# Patient Record
Sex: Male | Born: 1980 | Race: Black or African American | Hispanic: No | Marital: Single | Smoking: Current every day smoker
Health system: Southern US, Community
[De-identification: ages and names within clinical notes are randomized; demographics above are authoritative.]

## PROBLEM LIST (undated history)

## (undated) DIAGNOSIS — I10 Essential (primary) hypertension: Secondary | ICD-10-CM

## (undated) DIAGNOSIS — E785 Hyperlipidemia, unspecified: Secondary | ICD-10-CM

## (undated) DIAGNOSIS — I5020 Unspecified systolic (congestive) heart failure: Secondary | ICD-10-CM

## (undated) DIAGNOSIS — T50901A Poisoning by unspecified drugs, medicaments and biological substances, accidental (unintentional), initial encounter: Secondary | ICD-10-CM

## (undated) DIAGNOSIS — Z91148 Patient's other noncompliance with medication regimen for other reason: Secondary | ICD-10-CM

## (undated) DIAGNOSIS — F1210 Cannabis abuse, uncomplicated: Secondary | ICD-10-CM

## (undated) DIAGNOSIS — Z720 Tobacco use: Secondary | ICD-10-CM

## (undated) DIAGNOSIS — F1410 Cocaine abuse, uncomplicated: Secondary | ICD-10-CM

## (undated) DIAGNOSIS — F1111 Opioid abuse, in remission: Secondary | ICD-10-CM

## (undated) DIAGNOSIS — R45851 Suicidal ideations: Secondary | ICD-10-CM

## (undated) DIAGNOSIS — Z5900 Homelessness unspecified: Secondary | ICD-10-CM

## (undated) DIAGNOSIS — F1910 Other psychoactive substance abuse, uncomplicated: Secondary | ICD-10-CM

## (undated) HISTORY — PX: OTHER SURGICAL HISTORY: SHX169

---

## 2000-05-15 ENCOUNTER — Emergency Department (HOSPITAL_COMMUNITY): Admission: EM | Admit: 2000-05-15 | Discharge: 2000-05-15 | Payer: Self-pay | Admitting: Emergency Medicine

## 2000-05-17 ENCOUNTER — Emergency Department (HOSPITAL_COMMUNITY): Admission: EM | Admit: 2000-05-17 | Discharge: 2000-05-17 | Payer: Self-pay | Admitting: Emergency Medicine

## 2004-06-18 ENCOUNTER — Emergency Department (HOSPITAL_COMMUNITY): Admission: EM | Admit: 2004-06-18 | Discharge: 2004-06-18 | Payer: Self-pay | Admitting: Emergency Medicine

## 2004-10-18 ENCOUNTER — Emergency Department (HOSPITAL_COMMUNITY): Admission: EM | Admit: 2004-10-18 | Discharge: 2004-10-18 | Payer: Self-pay

## 2004-10-22 ENCOUNTER — Emergency Department (HOSPITAL_COMMUNITY): Admission: EM | Admit: 2004-10-22 | Discharge: 2004-10-22 | Payer: Self-pay | Admitting: Emergency Medicine

## 2005-05-08 ENCOUNTER — Emergency Department (HOSPITAL_COMMUNITY): Admission: EM | Admit: 2005-05-08 | Discharge: 2005-05-08 | Payer: Self-pay | Admitting: Emergency Medicine

## 2006-03-07 ENCOUNTER — Emergency Department (HOSPITAL_COMMUNITY): Admission: EM | Admit: 2006-03-07 | Discharge: 2006-03-07 | Payer: Self-pay | Admitting: Emergency Medicine

## 2007-02-25 ENCOUNTER — Emergency Department (HOSPITAL_COMMUNITY): Admission: EM | Admit: 2007-02-25 | Discharge: 2007-02-25 | Payer: Self-pay | Admitting: Emergency Medicine

## 2008-08-23 ENCOUNTER — Emergency Department (HOSPITAL_COMMUNITY): Admission: EM | Admit: 2008-08-23 | Discharge: 2008-08-23 | Payer: Self-pay | Admitting: Emergency Medicine

## 2008-12-23 ENCOUNTER — Emergency Department (HOSPITAL_COMMUNITY): Admission: EM | Admit: 2008-12-23 | Discharge: 2008-12-24 | Payer: Self-pay | Admitting: Emergency Medicine

## 2009-09-24 ENCOUNTER — Emergency Department (HOSPITAL_COMMUNITY): Admission: EM | Admit: 2009-09-24 | Discharge: 2009-09-24 | Payer: Self-pay | Admitting: Emergency Medicine

## 2011-07-17 IMAGING — CR DG ABDOMEN 1V
1 series · 1 of 1 positions shown · non-contrast
Comparison: None.

CLINICAL DATA: Status post ingestion of bag of marijuana.

ABDOMEN - 1 VIEW

[view not recorded]
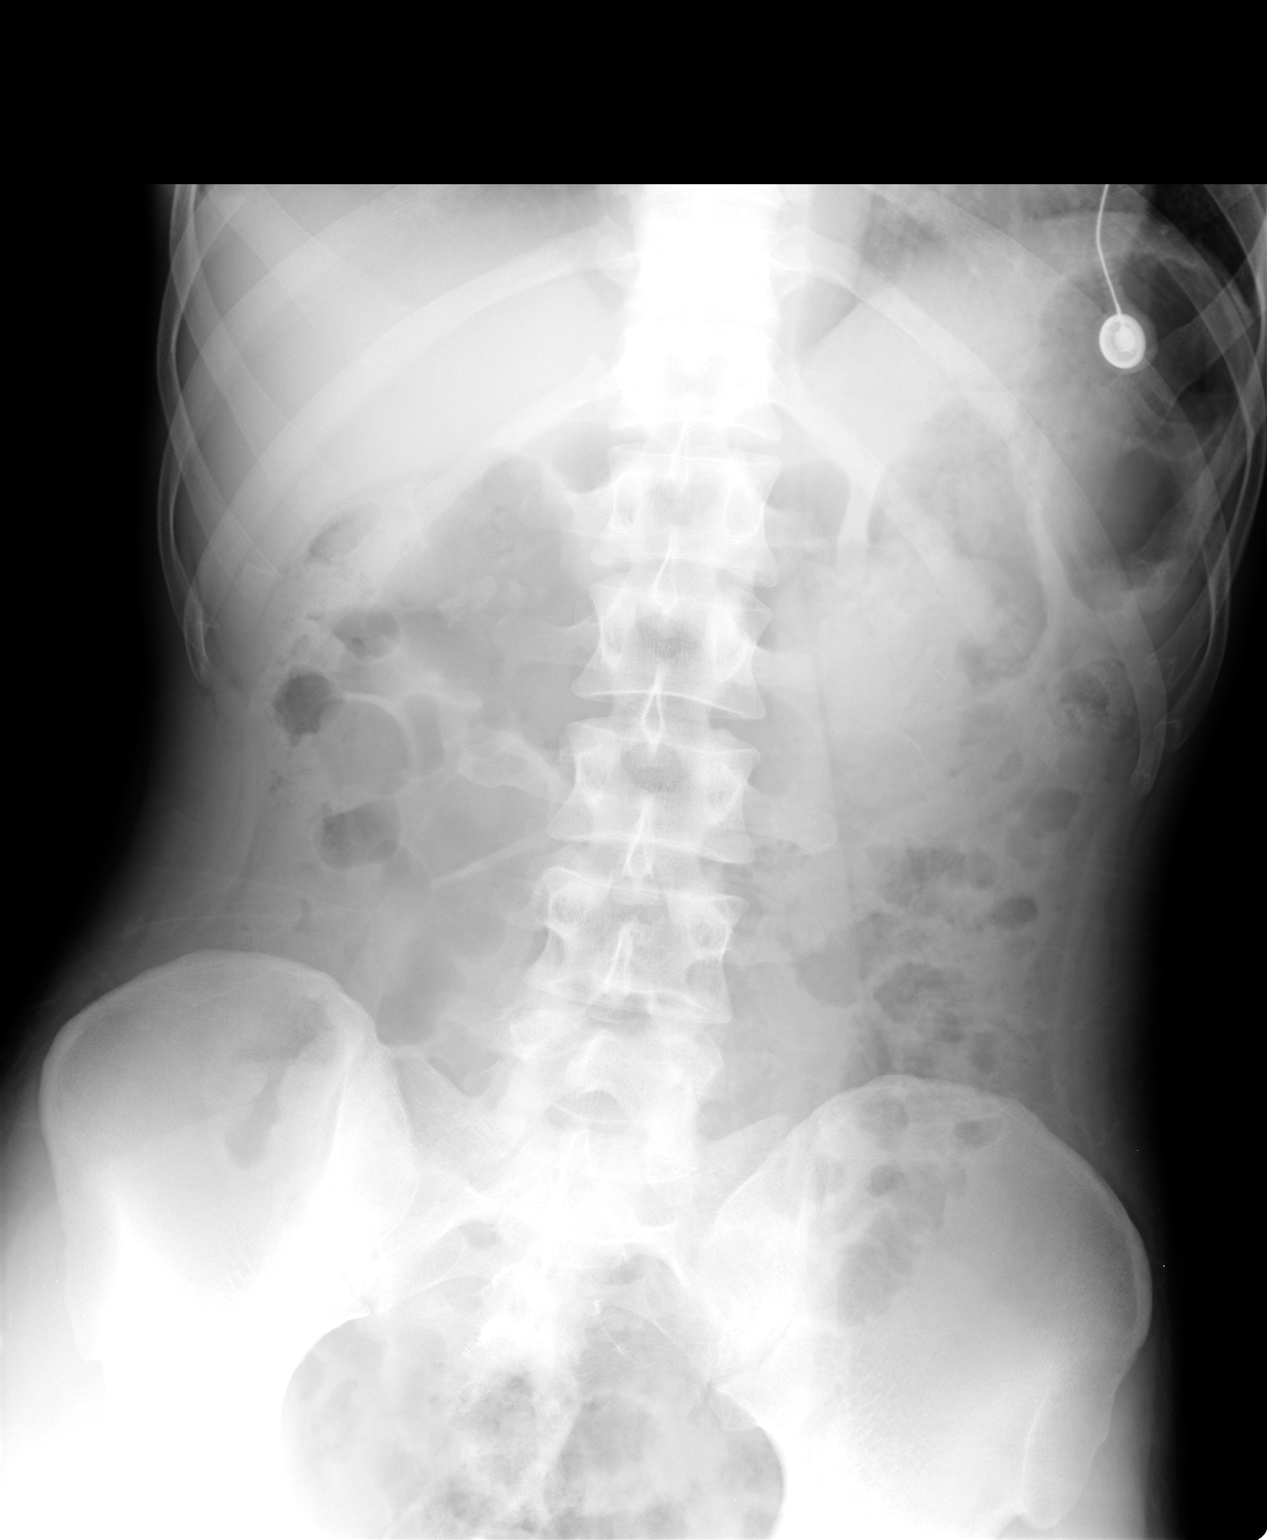

[1 of 1 positions shown; findings below may reference images not displayed]

FINDINGS: The visualized bowel gas pattern is unremarkable.
Scattered air and stool filled loops of colon are seen; no abnormal
dilatation of small bowel loops is seen to suggest small bowel
obstruction.  No free intra-abdominal air is identified.  Soft
tissue density overlying the left upper quadrant likely reflects
material within the stomach.

The visualized osseous structures are within normal limits; the
sacroiliac joints are unremarkable in appearance.  The visualized
lung bases are essentially clear.
IMPRESSION: 1.  Unremarkable bowel gas pattern; no free intra-abdominal air
seen.
2.  Soft tissue density material noted within the stomach.

## 2011-07-28 LAB — CBC
HCT: 44.3
Hemoglobin: 15
MCHC: 33.8
MCV: 88.4
Platelets: 203
RDW: 13.5
WBC: 5

## 2011-07-28 LAB — DIFFERENTIAL
Eosinophils Absolute: 0.1
Eosinophils Relative: 3
Lymphocytes Relative: 28
Lymphs Abs: 1.4

## 2011-07-28 LAB — COMPREHENSIVE METABOLIC PANEL
BUN: 16
CO2: 27
Calcium: 9.6
GFR calc Af Amer: >60
GFR calc non Af Amer: >60
Glucose, Bld: 96
Total Bilirubin: 0.7

## 2011-07-28 LAB — URINALYSIS, ROUTINE W REFLEX MICROSCOPIC
Hgb urine dipstick: NEGATIVE
Protein, ur: NEGATIVE
Specific Gravity, Urine: 1.017
Urobilinogen, UA: 1
pH: 6

## 2011-07-28 LAB — RAPID URINE DRUG SCREEN, HOSP PERFORMED
Benzodiazepines: NOT DETECTED
Cocaine: POSITIVE — AB
Opiates: NOT DETECTED

## 2011-07-28 LAB — ETHANOL: Alcohol, Ethyl (B): <5

## 2011-12-05 ENCOUNTER — Other Ambulatory Visit: Payer: Self-pay

## 2011-12-05 ENCOUNTER — Encounter (HOSPITAL_COMMUNITY): Payer: Self-pay | Admitting: Emergency Medicine

## 2011-12-05 ENCOUNTER — Emergency Department (HOSPITAL_COMMUNITY)
Admission: EM | Admit: 2011-12-05 | Discharge: 2011-12-08 | Disposition: A | Discharge status: Other Acute Inpatient Hospital | Payer: Self-pay | Attending: Emergency Medicine | Admitting: Emergency Medicine

## 2011-12-05 DIAGNOSIS — R509 Fever, unspecified: Secondary | ICD-10-CM | POA: Insufficient documentation

## 2011-12-05 DIAGNOSIS — F17200 Nicotine dependence, unspecified, uncomplicated: Secondary | ICD-10-CM | POA: Insufficient documentation

## 2011-12-05 DIAGNOSIS — R0989 Other specified symptoms and signs involving the circulatory and respiratory systems: Secondary | ICD-10-CM | POA: Insufficient documentation

## 2011-12-05 DIAGNOSIS — R110 Nausea: Secondary | ICD-10-CM | POA: Insufficient documentation

## 2011-12-05 DIAGNOSIS — R0609 Other forms of dyspnea: Secondary | ICD-10-CM | POA: Insufficient documentation

## 2011-12-05 DIAGNOSIS — F29 Unspecified psychosis not due to a substance or known physiological condition: Secondary | ICD-10-CM | POA: Insufficient documentation

## 2011-12-05 LAB — RAPID URINE DRUG SCREEN, HOSP PERFORMED
Amphetamines: NOT DETECTED
Barbiturates: NOT DETECTED
Cocaine: POSITIVE — AB
Opiates: NOT DETECTED
Tetrahydrocannabinol: POSITIVE — AB

## 2011-12-05 LAB — POCT I-STAT, CHEM 8
BUN: 16 mg/dL (ref 6–23)
Chloride: 103 mEq/L (ref 96–112)
Glucose, Bld: 102 mg/dL — ABNORMAL HIGH (ref 70–99)
Hemoglobin: 14.3 g/dL (ref 13.0–17.0)
Potassium: 3.5 mEq/L (ref 3.5–5.1)
Sodium: 142 mEq/L (ref 135–145)
TCO2: 28 mmol/L (ref 0–100)

## 2011-12-05 LAB — CBC
HCT: 39.1 % (ref 39.0–52.0)
RDW: 14 % (ref 11.5–15.5)

## 2011-12-05 LAB — DIFFERENTIAL
Basophils Absolute: 0 10*3/uL (ref 0.0–0.1)
Basophils Relative: 0 % (ref 0–1)
Monocytes Absolute: 0.7 10*3/uL (ref 0.1–1.0)
Neutro Abs: 6.7 10*3/uL (ref 1.7–7.7)
Neutrophils Relative %: 72 % (ref 43–77)

## 2011-12-05 MED ORDER — ONDANSETRON 4 MG PO TBDP
4.0000 mg | ORAL_TABLET | Freq: Once | ORAL | Status: AC
  Administered 2011-12-05: 4 mg via ORAL
  Filled 2011-12-05: qty 1

## 2011-12-05 MED ORDER — NICOTINE 21 MG/24HR TD PT24
21.0000 mg | MEDICATED_PATCH | Freq: Every day | TRANSDERMAL | Status: DC
  Administered 2011-12-06: 21 mg via TRANSDERMAL
  Filled 2011-12-05: qty 1

## 2011-12-05 MED ORDER — IBUPROFEN 600 MG PO TABS
600.0000 mg | ORAL_TABLET | Freq: Three times a day (TID) | ORAL | Status: DC | PRN
  Administered 2011-12-05 – 2011-12-06 (×2): 600 mg via ORAL
  Filled 2011-12-05 (×2): qty 1

## 2011-12-05 MED ORDER — LORAZEPAM 1 MG PO TABS
1.0000 mg | ORAL_TABLET | Freq: Three times a day (TID) | ORAL | Status: DC | PRN
  Administered 2011-12-08: 1 mg via ORAL
  Filled 2011-12-05: qty 1

## 2011-12-05 NOTE — ED Notes (Signed)
Pt has two  belonging bags in locker #41

## 2011-12-05 NOTE — ED Notes (Signed)
Pt alert, presents via [employer], c/o SI/HI, pt states "i took a bunch of drugs, i want to hurt myself", denies plan, pt resp even unlabored, skin pwd

## 2011-12-05 NOTE — ED Provider Notes (Signed)
Pt seen and evaluated in the psych ED, no current complaints.  Waiting ACT assessment and attempt at placement  Ethelda Chick, MD 12/05/11 1050

## 2011-12-05 NOTE — BH Assessment (Signed)
This assessment/evaluation/note was completed by a Child psychotherapist of Social Work Warden/ranger and as Investment banker, corporate, I was immediately available for consultation/collaboration. I am in agreement with the contents and disposition reflected in the assessment/evaluation/note(s).   Ileene Hutchinson , MSW, LCSWA 12/05/2011 2:15 PM [phone]

## 2011-12-05 NOTE — ED Provider Notes (Signed)
History     CSN: [id number]  Arrival date & time 12/05/11  4332   First MD Initiated Contact with Patient 12/05/11 0455      Chief Complaint  Patient presents with  . Suicidal  . Medical Clearance    (Consider location/radiation/quality/duration/timing/severity/associated sxs/prior treatment) HPI Comments: Patient states he is tired of living and took double his usual amount of cocaine, which is 1/2 g, tonight he took 1 g of cocaine and drank a "bunch" of alcohol and took some pills that somebody gave him.   The history is provided by the patient.    History reviewed. No pertinent past medical history.  History reviewed. No pertinent past surgical history.  No family history on file.  History  Substance Use Topics  . Smoking status: Current Everyday Smoker -- 1.0 packs/day    Types: Cigarettes  . Smokeless tobacco: Not on file  . Alcohol Use: Yes      Review of Systems  Constitutional: Positive for fever.  Respiratory: Negative for cough and shortness of breath.   Cardiovascular: Negative for chest pain.  Gastrointestinal: Positive for nausea. Negative for vomiting.  Musculoskeletal: Negative for myalgias.  Skin: Negative for color change.  Neurological: Negative for dizziness, weakness and numbness.  Psychiatric/Behavioral: Positive for suicidal ideas. Negative for hallucinations, confusion and agitation.    Allergies  Review of patient's allergies indicates no known allergies.  Home Medications  No current outpatient prescriptions on file.  BP 151/108  Pulse 70  Temp(Src) 98 F (36.7 C) (Oral)  Resp 20  Wt 170 lb (77.111 kg)  SpO2 100%  Physical Exam  Constitutional: He is oriented to person, place, and time. He appears well-developed.  HENT:  Head: Normocephalic.  Eyes: Pupils are equal, round, and reactive to light.  Neck: Normal range of motion.  Cardiovascular: Normal rate.   Pulmonary/Chest: He is in respiratory distress.  Abdominal: Soft.    Musculoskeletal: Normal range of motion.  Neurological: He is alert and oriented to person, place, and time.  Skin: Skin is warm and dry.  Psychiatric: He expresses suicidal ideation. He expresses no suicidal plans.    ED Course  Procedures (including critical care time)   Labs Reviewed  ETHANOL  URINE RAPID DRUG SCREEN (HOSP PERFORMED)  CBC  DIFFERENTIAL   No results found.   No diagnosis found.  ED ECG REPORT   Date: 12/05/2011  EKG Time: 5:43 AM  Rate: 57  Rhythm: normal sinus rhythm,  normal EKG, normal sinus rhythm, unchanged from previous tracings  Axis: normal  Intervals:imcomplete RBBB  ST&T Change: none  Narrative Interpretation: abnormal EKG            MDM  Suicidal.  Will have patient moved to the psychiatric area and requests evaluation        Arman Filter, NP 12/05/11 0545  Medical screening examination/treatment/procedure(s) were performed by non-physician practitioner and as supervising physician I was immediately available for consultation/collaboration.   Sunnie Nielsen, MD 12/05/11 0700

## 2011-12-05 NOTE — ED Notes (Signed)
NP bedside to eval pt

## 2011-12-05 NOTE — ED Notes (Signed)
Security bedside to search belongings/wand pt

## 2011-12-05 NOTE — BH Assessment (Signed)
Assessment Note   Jeremy Fritz is an 31 y.o. male admitted to [facility] via [employer] after patient called 911 for help. Patient relates recent increased use of cocaine, crack, and ETOH with intent to hurt himself. Patient endorses recent and ongoing suicidal ideations for past several weeks.   Patient states that he has been increasingly despondent following break-up with his girlfriend around 2-3 weeks ago. He has had frequent arguments with his parents whom he currently lives with. "Everything is out of control in my life and I just don't want to keep going."  Patient is unemployed and states he has never really held a steady job.  He has been in and out of jail since his early teens. Patient reports currently hearing voices that tell him to hurt others or himself if he does not get drugs. He feels he could act on this voice. He has a long term history of violence towards others. He endorses signs of depression. Patient relates drug of choice use is either crack or cocaine but he will take any drugs available. He states he has never used heroin because you have to inject this drug and he states he would not use a needle. Patient states he feels afraid that someone or something is behind him much of the time or that it is going to hurt him.  He has had this sensation for many years. He is requesting help with his drug and alcohol use but also wants to feel safe and have suicidal thoughts to be resolved. Patient reports using an average of 1 gm of cocaine per day but will use any and all drugs given to him without concern of amount.  He reports ETOH use as sporatic- he drinks mostly on weekends if alcohol is available.  Patient reports that his depression and suicidal thoughts increase with drug and alcohol use.  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 No pertinent past medical history. Axis IV: economic problems, housing problems, other  psychosocial or environmental problems, problems related to social environment and problems with primary support group Axis V: 31-40 Some impairment in reality testing  Past Medical History: History reviewed. No pertinent past medical history.  History reviewed. No pertinent past surgical history.  Family History: No family history on file.  Social History:  reports that he has been smoking Cigarettes.  He has been smoking about 1 pack per day. He does not have any smokeless tobacco history on file. He reports that he drinks alcohol. He reports that he uses illicit drugs (Cocaine, Marijuana, and Methamphetamines).  Additional Social History:    Allergies: No Known Allergies  Home Medications:  Medications Prior to Admission  Medication Dose Route Frequency Provider Last Rate Last Dose  . ibuprofen (ADVIL,MOTRIN) tablet 600 mg  600 mg Oral Q8H PRN Arman Filter, NP      . LORazepam (ATIVAN) tablet 1 mg  1 mg Oral Q8H PRN Arman Filter, NP      . nicotine (NICODERM CQ - dosed in mg/24 hours) patch 21 mg  21 mg Transdermal Daily Arman Filter, NP      . ondansetron (ZOFRAN-ODT) disintegrating tablet 4 mg  4 mg Oral Once Arman Filter, NP   4 mg at 12/05/11 0545   No current outpatient prescriptions on file as of 12/05/2011.    OB/GYN Status:  No LMP for male patient.  General Assessment Data Location of Assessment: [facility] ED  ACT Assessment: Yes Living Arrangements: Parent Can pt return to current living arrangement?: Yes ("Can" return there but sit. is stressful. Otherwise- homeles) Admission Status: Voluntary Is patient capable of signing voluntary admission?: Yes Transfer from: Acute Hospital Referral Source: Self/Family/Friend  Education Status Is patient currently in school?: No  Risk to self Suicidal Ideation: Yes-Currently Present Suicidal Intent: Yes-Currently Present Is patient at risk for suicide?: Yes Suicidal Plan?: Yes-Currently Present (Cut wrists or overdose on  drugs) Specify Current Suicidal Plan:  (OD on drugs/alcohol or cut his wrists) Access to Means: No (Not at this time) What has been your use of drugs/alcohol within the last 12 months?:  (Used drugs/alcohol since the age of 39- no sobriety) Previous Attempts/Gestures: Yes How many times?:  (One time- cut wrist but "hid" his attempt) Other Self Harm Risks:  (Uses any drugs available) Triggers for Past Attempts: Other (Comment) (Family/ support issues) Intentional Self Injurious Behavior: Cutting Comment - Self Injurious Behavior:  (Cutting behav. started in 20's. Last time-2 yrs ago.) Family Suicide History: No Recent stressful life event(s): Other (Comment) (Breakup w/ GF; family stresses) Persecutory voices/beliefs?: Yes Depression: Yes Depression Symptoms: Despondent;Isolating;Fatigue;Loss of interest in usual pleasures;Feeling worthless/self pity;Feeling angry/irritable Substance abuse history and/or treatment for substance abuse?: Yes (abuse hx but no treatment) Suicide prevention information given to non-admitted patients: Not applicable  Risk to Others Homicidal Ideation: Yes-Currently Present Thoughts of Harm to Others: Yes-Currently Present Comment - Thoughts of Harm to Others:  (Voices tell him to hurt others to get drugs) Current Homicidal Intent: No-Not Currently/Within Last 6 Months Current Homicidal Plan: No-Not Currently/Within Last 6 Months Access to Homicidal Means: No (Has gun at home) Identified Victim:  (No identified victim) History of harm to others?: Yes Assessment of Violence: In distant past Violent Behavior Description:  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) Does patient have access to weapons?: Yes (Comment) (Guns/knives at home- ) Criminal Charges Pending?: No Does patient have a court date: No  Psychosis Hallucinations: Auditory;With command Delusions: Unspecified (Thinks someone is following him; not feeling "safe")  Mental Status  Report Appear/Hygiene: Other (Comment) (Unkempt) Eye Contact: Poor Motor Activity: Unremarkable Speech: Soft Level of Consciousness: Alert Mood: Depressed Affect: Depressed Anxiety Level: None Thought Processes: Coherent;Relevant Judgement: Impaired Orientation: Person;Place;Time;Situation Obsessive Compulsive Thoughts/Behaviors: None  Cognitive Functioning Concentration: Normal Memory: Recent Intact;Remote Intact IQ: Average Insight: Fair Impulse Control: Poor Appetite: Good Sleep: No Change (Sleeps alot due to drug use) Total Hours of Sleep:  (Varies with type of drug used) Vegetative Symptoms: None  Prior Inpatient Therapy : Yes Prior Inpatient Therapy: Yes- [facility] Prior Therapy Dates:  2009  Reason for Treatment: Substance Abuse  Prior Outpatient Therapy Prior Outpatient Therapy: No Prior Therapy Dates:  (NA) Prior Therapy Facilty/Provider(s):  (NA) Reason for Treatment: NA            Values / Beliefs Cultural Requests During Hospitalization: None Spiritual Requests During Hospitalization: None        Additional Information 1:1 In Past 12 Months?: No CIRT Risk: No Elopement Risk: No Does patient have medical clearance?: Yes     Disposition:  Disposition Disposition of Patient: Other dispositions Other disposition(s):  (Pending further evaluation)  On Site Evaluation by:   Reviewed with Physician:     Bradly Bienenstock SW Intern 12/05/2011 9:28 AM

## 2011-12-06 NOTE — ED Provider Notes (Cosign Needed)
History     CSN: [id number]  Arrival date & time 12/05/11  1191   First MD Initiated Contact with Patient 12/05/11 0455      Chief Complaint  Patient presents with  . Suicidal  . Medical Clearance    (Consider location/radiation/quality/duration/timing/severity/associated sxs/prior treatment) HPI  History reviewed. No pertinent past medical history.  History reviewed. No pertinent past surgical history.  No family history on file.  History  Substance Use Topics  . Smoking status: Current Everyday Smoker -- 1.0 packs/day    Types: Cigarettes  . Smokeless tobacco: Not on file  . Alcohol Use: Yes      Review of Systems  Allergies  Review of patient's allergies indicates no known allergies.  Home Medications  No current outpatient prescriptions on file.  BP 143/95  Pulse 79  Temp(Src) 97.3 F (36.3 C) (Oral)  Resp 16  Wt 170 lb (77.111 kg)  SpO2 97%  Physical Exam  ED Course  Procedures (including critical care time)  Labs Reviewed  URINE RAPID DRUG SCREEN (HOSP PERFORMED) - Abnormal; Notable for the following:    Cocaine POSITIVE (*)    Tetrahydrocannabinol POSITIVE (*)    All other components within normal limits  POCT I-STAT, CHEM 8 - Abnormal; Notable for the following:    Glucose, Bld 102 (*)    All other components within normal limits  ETHANOL  CBC  DIFFERENTIAL   No results found.   No diagnosis found.    MDM  detox        Arman Filter, NP 12/06/11 2009

## 2011-12-07 NOTE — ED Notes (Signed)
Per Darl Pikes at [facility], there are no beds available today however oncoming clinician can call back tomorrow to determine if there are any vacancies. Disposition is pending.

## 2011-12-07 NOTE — ED Notes (Signed)
CSW completed and fax referral to [facility] as possible disposition. Per ACT shift report, pt has been declined at [facility].

## 2011-12-07 NOTE — ED Notes (Signed)
Pt appeared to be standing in the room at his door window looking at the pt across the hall an elderly male; masturbating; security notified; spoke to pt; pt stopped the behavior.

## 2011-12-07 NOTE — ED Notes (Signed)
Pt says he still has voices telling him to cut his wrist but he's able to contract for safety; continues passive SI with contracting for safety as well. NAD

## 2011-12-07 NOTE — ED Provider Notes (Signed)
Patient suicidal with reports that he took od- patient medically cleared.  Continues suicidal and is awaiting placement at [facility].   Jeremy Quarry, MD 12/07/11 [phone]

## 2011-12-08 ENCOUNTER — Encounter (HOSPITAL_COMMUNITY): Payer: Self-pay

## 2011-12-08 ENCOUNTER — Inpatient Hospital Stay (HOSPITAL_COMMUNITY)
Admission: AD | Admit: 2011-12-08 | Discharge: 2011-12-10 | DRG: 897 | Disposition: A | Discharge status: Home / Self Care | Payer: PRIVATE HEALTH INSURANCE | Source: Ambulatory Visit | Attending: Psychiatry | Admitting: Psychiatry

## 2011-12-08 DIAGNOSIS — F1410 Cocaine abuse, uncomplicated: Secondary | ICD-10-CM | POA: Diagnosis present

## 2011-12-08 DIAGNOSIS — F1020 Alcohol dependence, uncomplicated: Secondary | ICD-10-CM

## 2011-12-08 DIAGNOSIS — F1210 Cannabis abuse, uncomplicated: Secondary | ICD-10-CM

## 2011-12-08 DIAGNOSIS — F1420 Cocaine dependence, uncomplicated: Principal | ICD-10-CM

## 2011-12-08 DIAGNOSIS — F1994 Other psychoactive substance use, unspecified with psychoactive substance-induced mood disorder: Secondary | ICD-10-CM

## 2011-12-08 DIAGNOSIS — F411 Generalized anxiety disorder: Secondary | ICD-10-CM

## 2011-12-08 MED ORDER — NICOTINE 21 MG/24HR TD PT24
21.0000 mg | MEDICATED_PATCH | Freq: Every day | TRANSDERMAL | Status: DC
  Filled 2011-12-08 (×2): qty 1

## 2011-12-08 MED ORDER — CHLORDIAZEPOXIDE HCL 25 MG PO CAPS: 25.0000 mg | ORAL_CAPSULE | Freq: Four times a day (QID) | ORAL | Status: DC | PRN

## 2011-12-08 MED ORDER — CHLORDIAZEPOXIDE HCL 25 MG PO CAPS
25.0000 mg | ORAL_CAPSULE | Freq: Once | ORAL | Status: AC
  Administered 2011-12-08: 25 mg via ORAL
  Filled 2011-12-08: qty 1

## 2011-12-08 MED ORDER — VITAMIN B-1 100 MG PO TABS
100.0000 mg | ORAL_TABLET | Freq: Every day | ORAL | Status: DC
  Administered 2011-12-08 – 2011-12-10 (×3): 100 mg via ORAL
  Filled 2011-12-08 (×4): qty 1

## 2011-12-08 MED ORDER — MAGNESIUM HYDROXIDE 400 MG/5ML PO SUSP: 30.0000 mL | Freq: Every day | ORAL | Status: DC | PRN

## 2011-12-08 MED ORDER — ADULT MULTIVITAMIN W/MINERALS CH
1.0000 | ORAL_TABLET | Freq: Every day | ORAL | Status: DC
  Administered 2011-12-08 – 2011-12-10 (×3): 1 via ORAL
  Filled 2011-12-08 (×2): qty 1

## 2011-12-08 MED ORDER — FOLIC ACID 1 MG PO TABS
1.0000 mg | ORAL_TABLET | Freq: Every day | ORAL | Status: DC
  Administered 2011-12-08 – 2011-12-10 (×3): 1 mg via ORAL
  Filled 2011-12-08 (×4): qty 1

## 2011-12-08 MED ORDER — ALUM & MAG HYDROXIDE-SIMETH 200-200-20 MG/5ML PO SUSP: 30.0000 mL | ORAL | Status: DC | PRN

## 2011-12-08 MED ORDER — ACETAMINOPHEN 325 MG PO TABS
650.0000 mg | ORAL_TABLET | Freq: Four times a day (QID) | ORAL | Status: DC | PRN
  Administered 2011-12-09 – 2011-12-10 (×2): 650 mg via ORAL

## 2011-12-08 MED ORDER — TRAZODONE HCL 100 MG PO TABS
100.0000 mg | ORAL_TABLET | Freq: Every evening | ORAL | Status: DC | PRN
  Administered 2011-12-08 – 2011-12-09 (×3): 100 mg via ORAL
  Filled 2011-12-08 (×6): qty 1

## 2011-12-08 NOTE — Progress Notes (Signed)
Pt came out of group to take medications ordered for him.  He is new to the unit and orders were obtained after 1930.  He is here for suicidal ideation, but he denies SI/HI at this time.  He denies withdrawal symptoms.  He reports no major medical issues.  He was pleasant/cooperative with this Clinical research associate and took the ordered meds.  He was given a loading dose of Librium 25mg  and has Librium 25mg  q6h prn available if needed.  He did not appear paranoid or agitated as reported from the ED.  Educated pt on meds given, and encouraged pt to make his needs known to staff.  Safety maintained with q15 minute checks.

## 2011-12-08 NOTE — BHH Counselor (Signed)
Patient accepted to [facility] by Lynann Bologna, NP. The room assignment is 306-2. The EDP-Dr. Patrica Duel made aware of patients disposition. Pt's nurse also made aware of patients disposition.

## 2011-12-08 NOTE — Tx Team (Signed)
Initial Interdisciplinary Treatment Plan  PATIENT STRENGTHS: (choose at least two) Ability for insight Average or above average intelligence Supportive family/friends  PATIENT STRESSORS: Financial difficulties Substance abuse   PROBLEM LIST: Problem List/Patient Goals Date to be addressed Date deferred Reason deferred Estimated date of resolution  Depression 12/08/11     Polysubstance Abuse 12/08/11                                                DISCHARGE CRITERIA:  Ability to meet basic life and health needs Improved stabilization in mood, thinking, and/or behavior Motivation to continue treatment in a less acute level of care Need for constant or close observation no longer present Reduction of life-threatening or endangering symptoms to within safe limits Safe-care adequate arrangements made Verbal commitment to aftercare and medication compliance Withdrawal symptoms are absent or subacute and managed without 24-hour nursing intervention  PRELIMINARY DISCHARGE PLAN: Attend 12-step recovery group Return to previous living arrangement  PATIENT/FAMIILY INVOLVEMENT: This treatment plan has been presented to and reviewed with the patient, HOLDAN STUCKE, and/or family member, .  The patient and family have been given the opportunity to ask questions and make suggestions.  Johny Drilling, Lakyla Biswas Dawkins 12/08/2011, 6:36 PM

## 2011-12-08 NOTE — Progress Notes (Signed)
Voluntary admission for a 31 y.o. Male with flat affect, anxious and depressed mood.  Pt. Reported that he was out drinking and drugging on Thursday and blacked out and when he awoke he called the police and they took him to the Hospital.  Reports polysubstance abuse for 12years (Cocaine, THC,) Drinks a Fifth of ETOH every 2 days.  Pt. Denies SI/HI and contracts for safety.  Reports that he does hear voices but not at present.  Denies visual hallucinations. No Major Medical Issues, NKDA.  Pt. Offered food and oriented to unit.

## 2011-12-08 NOTE — ED Notes (Signed)
Pt anxious, reports agitation, still reports voices and paranoia feeling like the cop (off duty officer) is watching him. Will medicate per orders.

## 2011-12-09 DIAGNOSIS — F1994 Other psychoactive substance use, unspecified with psychoactive substance-induced mood disorder: Secondary | ICD-10-CM

## 2011-12-09 DIAGNOSIS — F1410 Cocaine abuse, uncomplicated: Secondary | ICD-10-CM | POA: Diagnosis present

## 2011-12-09 LAB — CBC
MCH: 28.8 pg (ref 26.0–34.0)
MCV: 84.8 fL (ref 78.0–100.0)
Platelets: 212 10*3/uL (ref 150–400)
RDW: 13.6 % (ref 11.5–15.5)

## 2011-12-09 LAB — COMPREHENSIVE METABOLIC PANEL
AST: 13 U/L (ref 0–37)
Albumin: 4.1 g/dL (ref 3.5–5.2)
BUN: 14 mg/dL (ref 6–23)
CO2: 28 mEq/L (ref 19–32)
Calcium: 9.7 mg/dL (ref 8.4–10.5)
Creatinine, Ser: 1.22 mg/dL (ref 0.50–1.35)
GFR calc non Af Amer: 78 mL/min — ABNORMAL LOW (ref >90)

## 2011-12-09 NOTE — H&P (Signed)
Psychiatric Admission Assessment Adult  Patient Identification:  Jeremy EMPEY Date of Evaluation:  12/09/2011 Chief Complaint:  Substance Induced Mood Disorder  History of Present Illness:: Pt. States he had a "blackout while doing an assortment of drugs." So, he dialed 911 and the police brought him to the hospital.  Mood Symptoms:  denies Depression Symptoms:  denies (Hypo) Manic Symptoms:  denies Anxiety Symptoms:  denies Psychotic Symptoms:  denies PTSD Symptoms:denies Past Psychiatric History:  None Diagnosis:  Hospitalizations:  Outpatient Care:  Substance Abuse Care:  Self-Mutilation:  Suicidal Attempts:  Violent Behaviors:   Past Medical History:  History reviewed. No pertinent past medical history.  Allergies:  No Known Allergies PTA Medications: No prescriptions prior to admission  Previous Psychotropic Medications: None  Substance Abuse History in the last 12 months: Substance Age of 1st Use Last Use Amount Specific Type  Nicotine   1.ppd    Alcohol    occasionally    Cannabis    frequently    Opiates   denies heroin or IV drugs    Cocaine    100-200$ q d    Methamphetamines     none    LSD     none    Ecstasy     yes    Benzodiazepines     yes    Caffeine      Inhalants      denies    Others:                         Consequences of Substance Abuse: Legal Consequences:  3 years prison for drugs  "I sell drugs to use drugs."  Social History: Current Place of Residence:  [place] Place of Birth:   Family Members: Marital Status:  Single but currently in a relationship Education:  GED Educational Problems/Performance: Religious Beliefs/Practices: History of Abuse (Emotional/Phsycial/Sexual) Occupational Experiences; Military History:  None. Legal History:  None currently, but history of multiple drug related charges. Hobbies/Interests:  Family History:  History reviewed. No pertinent family history.  Mental Status  Examination/Evaluation: Objective:  Appearance: Casual  Eye Contact::  Good  Speech:  Clear and Coherent  Volume:  Normal  Mood:  Euthymic  Affect:  Appropriate  Thought Process:  Linear  Orientation:  Full  Thought Content:  WDL  Suicidal Thoughts:  No  Homicidal Thoughts:  No  Memory:  Immediate;   Good  Judgement:  Poor  Insight:  Lacking  Psychomotor Activity:  Normal  Concentration:  Good  Recall:  Good  Akathisia:  No  Handed:    AIMS (if indicated):     Assets:  Resilience  Sleep:  Number of Hours: 6.5     Laboratory/X-Ray Psychological Evaluation(s)      Assessment:    AXIS I: Cocaine Dependence; Alcohol Abuse v. Dependence; Cannabis Abuse; r/o Anxiety Disorder NOS; Reported AH when using Cocaine      AXIS II:  Deferred AXIS III:  History reviewed. No pertinent past medical history. AXIS IV:  problems related to legal system/crime and problems with primary support group AXIS V:  51-60 moderate symptoms  Treatment Plan/Recommendations: Pt. Declines the need or desire for further treatment past detox. Due to his grandmother's recent death, the patient is requesting D/C on Wednesday, 48 hrs. To attend her funeral.  Treatment Plan Summary: Daily contact with patient to assess and evaluate symptoms and progress in treatment Current Medications:  Current Facility-Administered Medications  Medication Dose Route Frequency  Provider Last Rate Last Dose  . acetaminophen (TYLENOL) tablet 650 mg  650 mg Oral Q6H PRN Mickeal Skinner, MD   650 mg at 12/09/11 0605  . alum & mag hydroxide-simeth (MAALOX/MYLANTA) 200-200-20 MG/5ML suspension 30 mL  30 mL Oral Q4H PRN Mickeal Skinner, MD      . chlordiazePOXIDE (LIBRIUM) capsule 25 mg  25 mg Oral Q6H PRN Mickeal Skinner, MD      . chlordiazePOXIDE (LIBRIUM) capsule 25 mg  25 mg Oral Once Mickeal Skinner, MD   25 mg at 12/08/11 2032  . folic acid (FOLVITE) tablet 1 mg  1 mg Oral Daily Mickeal Skinner, MD   1 mg at 12/09/11 1308  .  magnesium hydroxide (MILK OF MAGNESIA) suspension 30 mL  30 mL Oral Daily PRN Mickeal Skinner, MD      . mulitivitamin with minerals tablet 1 tablet  1 tablet Oral Daily Mickeal Skinner, MD   1 tablet at 12/09/11 6578  . nicotine (NICODERM CQ - dosed in mg/24 hours) patch 21 mg  21 mg Transdermal Q0600 Mickeal Skinner, MD      . thiamine (VITAMIN B-1) tablet 100 mg  100 mg Oral Daily Mickeal Skinner, MD   100 mg at 12/09/11 0823  . traZODone (DESYREL) tablet 100 mg  100 mg Oral QHS,MR X 1 Mickeal Skinner, MD   100 mg at 12/08/11 2208   Facility-Administered Medications Ordered in Other Encounters  Medication Dose Route Frequency Provider Last Rate Last Dose  . DISCONTD: ibuprofen (ADVIL,MOTRIN) tablet 600 mg  600 mg Oral Q8H PRN Arman Filter, NP   600 mg at 12/06/11 1817  . DISCONTD: LORazepam (ATIVAN) tablet 1 mg  1 mg Oral Q8H PRN Arman Filter, NP   1 mg at 12/08/11 1020  . DISCONTD: nicotine (NICODERM CQ - dosed in mg/24 hours) patch 21 mg  21 mg Transdermal Daily Arman Filter, NP   21 mg at 12/06/11 1117    Observation Level/Precautions:  Detox  Laboratory:    Psychotherapy:    Medications:    Routine PRN Medications:  Yes  Consultations:    Discharge Concerns:    Other:     Lloyd Huger T. Tierney Behl PAC For Dr. Javier Glazier 2/12/20138:55 AM

## 2011-12-09 NOTE — Progress Notes (Signed)
Patient's self inventory, needs medication for sleep, has good appetite, low energy level, poor attention span.  Rated depression and hopelessness zero.   Has experienced chilling, agitation.  Denied SI.  Has experienced lightheadedness and headaches in past 24 hours.  Worst pain #8 in past 24 hours.  After discharge, will not use drugs, change friends, stick with support system.  Unsure of discharge date.  No discharge plans.  Will not have problems taking meds after discharge. Has been cooperative and pleasant this morning.

## 2011-12-09 NOTE — Progress Notes (Signed)
Recreation Therapy Notes  12/09/2011         Time: 1000      Group Topic/Focus: Patient invited to participate in animal assisted therapy. Pets as a coping skill and responsibility were discussed.   Participation Level: Active  Participation Quality: Attentive  Affect: Blunted  Cognitive: Appropriate and Oriented   Additional Comments: Patient spoke about his pitbulls. Jeremy Fritz 12/09/2011 1:35 PM

## 2011-12-09 NOTE — Progress Notes (Signed)
Met pt in the hallway on his way to the group room.  He reports his day has been good.  He denies any withdrawal symptoms at this time.  He voices no needs/concerns.  His mood seems appropriate.  He denies SI/HI.  Safety maintained with q15 minute checks.
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
## 2011-12-10 NOTE — H&P (Signed)
Pt seen and evaluated upon admission, completed Admission Suicide Risk Assessment.  See orders.  Pt agreeable with plan.  Discussed with team.   

## 2011-12-10 NOTE — Progress Notes (Signed)
Pt d/c from hospital with his mother. All items returned. D/C instructions given. Pt denies si and hi.
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
## 2011-12-10 NOTE — Progress Notes (Addendum)
[facility] Case Management Discharge Plan:  Will you be returning to the same living situation after discharge: Yes,  home At discharge, do you have transportation home?:Yes,  Mom Do you have the ability to pay for your medications:Yes,  f/u with [facility]  Interagency Information:     Release of information consent forms completed and in the chart;  Patient's signature needed at discharge.  Patient to Follow up at:  Follow-up Information    Follow up with [facility]  on 12/18/2011. (Arrive there at 8 AM)    Contact information:   [address] [place], [place] [address] [phone]         Patient denies SI/HI:   Yes,  yes    Safety Planning and Suicide Prevention discussed:  Yes,  per counselor  Barrier to discharge identified:No.  Summary and Recommendations:   Tanya Nones 12/10/2011, 11:45 AM  Note reviewed by Ambrose Mantle, LCSW

## 2011-12-10 NOTE — Progress Notes (Signed)
Pt attended morning group. Good mood and good participation. Pt to d/c today and be picked up by mother. Will return home until f/u appointment with [facility] on 12/18/11.
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
## 2011-12-10 NOTE — Progress Notes (Signed)
[name] Family/Significant Other Suicide Prevention Education  Suicide Prevention Education:  Education Completed; Jarrid Lienhard at [phone] has been identified by the patient as the family member/significant other with whom the patient will be residing, and identified as the person(s) who will aid the patient in the event of a mental health crisis (suicidal ideations/suicide attempt).  With written consent from the patient, the family member/significant other has been provided the following suicide prevention education, prior to the and/or following the discharge of the patient.  The suicide prevention education provided includes the following:  Suicide risk factors  Suicide prevention and interventions  National Suicide Hotline telephone number  [facility] assessment telephone number  [facility] Emergency Assistance 911  [facility] and/or Residential Mobile Crisis Unit telephone number  Request made of family/significant other to:  Remove weapons (e.g., guns, rifles, knives), all items previously/currently identified as safety concern.    Remove drugs/medications (over-the-counter, prescriptions, illicit drugs), all items previously/currently identified as a safety concern.  Patient's mother reports there are no firearms or narcotics in the home and she has no concerns regarding his discharge especially in light of funeral service for family member Friday 12/12/11 and follow up scheduled at [facility] next Thursday 12/18/11  The family member/significant other verbalizes understanding of the suicide prevention education information provided.  The family member/significant other agrees to remove the items of safety concern listed above.  Clide Dales 12/10/2011, 10:57 AM

## 2011-12-10 NOTE — Progress Notes (Signed)
[facility] Group Notes:  (Counselor/Nursing/MHT/Case Management/Adjunct)  12/10/2011 12:55 PM  Type of Therapy:  Group Therapy  Participation Level:  Active  Participation Quality:  Appropriate  Affect:  Appropriate  Cognitive:  Appropriate  Insight:  Good  Engagement in Group:  Good  Engagement in Therapy:  Good  Modes of Intervention:  Clarification, Socialization and Support  Summary of Progress/Problems:  Jeremy Fritz shared that he experiences communication problems sometimes with those closest to him.  Others shared they feel they cannot ask for what they need because of the guilt and shame they feel in regard to their addiction and Jeremy Fritz showed physical signs of agreement. Jeremy Fritz also expressed that others honesty has been beneficial to his stay here.   Jeremy Fritz 12/10/2011, 12:55 PM

## 2011-12-11 NOTE — Tx Team (Signed)
Interdisciplinary Treatment Plan Update (Adult)  Date:  12/09/12 Time Reviewed:  11:18 AM   Progress in Treatment: Attending groups: Yes Participating in groups:  Yes Taking medication as prescribed: Yes Tolerating medication:  Yes Family/Significant othe contact made:   Patient understands diagnosis:  Yes Discussing patient identified problems/goals with staff:  Yes Medical problems stabilized or resolved:  Yes Denies suicidal/homicidal ideation: Yes Issues/concerns per patient self-inventory:  None identified Other: N/A  New problem(s) identified: None Identified  Reason for Continuation of Hospitalization: Stable to d/c  Interventions implemented related to continuation of hospitalization: Stable to d/c Additional comments: N/A  Estimated length of stay: D/C today  Discharge Plan: Pt will follow up at [facility] on Thursday 12/18/11 for further SA treatment  New goal(s): N/A  Review of initial/current patient goals per problem list:    1.  Goal(s): Address substance use  Met:  Yes  Target date: by discharge  As evidenced by: completed detox protocol and referred to appropriate treatment  2.  Goal (s): Reduce depressive symptoms  Met:  Yes  Target date: by discharge  As evidenced by: Reducing depression from a 10 to a 3 as reported by pt.    3.  Goal(s): Reduce anxiety symptoms  Met:  Yes  Target date: by discharge  As evidenced by: Reducing anxiety from a 10 to a 3 as reported by pt.     Attendees: Patient:  Jeremy Fritz 12/10/11 11:20 AM   Family:     Physician:  Lupe Carney, DO 12/10/11 11:18 AM   Nursing: Carolynn Comment, RN 12/10/11 11:18 AM   Case Manager:   12/10/11  11:18 AM   Counselor:  Ronda Fairly, LCSWA 12/10/11  11:18 AM   Other:  Neill Loft, RN 12/10/11  11:18 AM   Other:  Tanya Nones, SW intern 12/10/11 11:23 AM   Other:  Wilmon Arms, SW intern 12/10/11 11:23 AM   Other:      Scribe for Treatment Team:     Tanya Nones, SW intern  12/10/11 11:18 AM

## 2011-12-12 NOTE — Progress Notes (Signed)
Patient Discharge Instructions:  Admission Note Faxed,  12/12/2011 After Visit Summary Faxed,  12/12/2011 Faxed to the Next Level Care provider:  12/12/2011 D/C Summary Note faxed 12/12/2011 Facesheet faxed 12/12/2011  Faxed to [facility] @ [phone]  Wandra Scot, 12/12/2011, 6:56 PM

## 2012-01-06 ENCOUNTER — Encounter (HOSPITAL_BASED_OUTPATIENT_CLINIC_OR_DEPARTMENT_OTHER): Payer: Self-pay | Admitting: *Deleted

## 2012-01-06 ENCOUNTER — Emergency Department (HOSPITAL_BASED_OUTPATIENT_CLINIC_OR_DEPARTMENT_OTHER)
Admission: EM | Admit: 2012-01-06 | Discharge: 2012-01-06 | Disposition: A | Discharge status: Home / Self Care | Payer: Self-pay | Attending: Emergency Medicine | Admitting: Emergency Medicine

## 2012-01-06 DIAGNOSIS — R51 Headache: Secondary | ICD-10-CM | POA: Insufficient documentation

## 2012-01-06 DIAGNOSIS — F17200 Nicotine dependence, unspecified, uncomplicated: Secondary | ICD-10-CM | POA: Insufficient documentation

## 2012-01-06 HISTORY — DX: Opioid abuse, in remission: F11.11

## 2012-01-06 MED ORDER — KETOROLAC TROMETHAMINE 60 MG/2ML IM SOLN
60.0000 mg | Freq: Once | INTRAMUSCULAR | Status: AC
  Administered 2012-01-06: 60 mg via INTRAMUSCULAR
  Filled 2012-01-06: qty 2

## 2012-01-06 MED ORDER — DIPHENHYDRAMINE HCL 50 MG/ML IJ SOLN
25.0000 mg | Freq: Four times a day (QID) | INTRAMUSCULAR | Status: DC | PRN
  Administered 2012-01-06: 25 mg via INTRAMUSCULAR
  Filled 2012-01-06: qty 1

## 2012-01-06 MED ORDER — SUMATRIPTAN SUCCINATE 100 MG PO TABS: 100.0000 mg | ORAL_TABLET | ORAL | Status: AC | PRN

## 2012-01-06 MED ORDER — METOCLOPRAMIDE HCL 5 MG/ML IJ SOLN
10.0000 mg | Freq: Once | INTRAMUSCULAR | Status: AC
  Administered 2012-01-06: 10 mg via INTRAMUSCULAR
  Filled 2012-01-06: qty 2
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
## 2012-01-06 NOTE — Discharge Instructions (Signed)

## 2012-01-06 NOTE — ED Notes (Signed)
patient reports migraine for the past three days, took ibuprofen, no relief, currently in recovery and cannot take narcotics

## 2022-09-13 ENCOUNTER — Encounter: Payer: Self-pay | Admitting: *Deleted

## 2022-09-29 ENCOUNTER — Encounter: Payer: Self-pay | Admitting: *Deleted

## 2022-10-01 NOTE — Progress Notes (Signed)
Unable to contact pt by phone - no record of PCP of ongoing Tx with any provider documented in [facility]. Letter sent to pt with f/u urgently needed.

## 2022-10-09 ENCOUNTER — Encounter (HOSPITAL_COMMUNITY): Payer: Self-pay

## 2022-10-09 ENCOUNTER — Other Ambulatory Visit: Payer: Self-pay

## 2022-10-09 ENCOUNTER — Emergency Department (HOSPITAL_COMMUNITY)
Admission: EM | Admit: 2022-10-09 | Discharge: 2022-10-10 | Disposition: A | Discharge status: Home / Self Care | Payer: Commercial Managed Care - HMO | Attending: Emergency Medicine | Admitting: Emergency Medicine

## 2022-10-09 DIAGNOSIS — T400X1A Poisoning by opium, accidental (unintentional), initial encounter: Secondary | ICD-10-CM | POA: Insufficient documentation

## 2022-10-09 DIAGNOSIS — F17210 Nicotine dependence, cigarettes, uncomplicated: Secondary | ICD-10-CM | POA: Insufficient documentation

## 2022-10-09 DIAGNOSIS — R0902 Hypoxemia: Secondary | ICD-10-CM | POA: Diagnosis not present

## 2022-10-09 DIAGNOSIS — T50901A Poisoning by unspecified drugs, medicaments and biological substances, accidental (unintentional), initial encounter: Secondary | ICD-10-CM

## 2022-10-09 LAB — COMPREHENSIVE METABOLIC PANEL
ALT: 30 U/L (ref 0–44)
AST: 27 U/L (ref 15–41)
Albumin: 3.8 g/dL (ref 3.5–5.0)
Alkaline Phosphatase: 49 U/L (ref 38–126)
Anion gap: 6 (ref 5–15)
BUN: 13 mg/dL (ref 6–20)
CO2: 26 mmol/L (ref 22–32)
Calcium: 8.8 mg/dL — ABNORMAL LOW (ref 8.9–10.3)
Chloride: 109 mmol/L (ref 98–111)
Creatinine, Ser: 0.76 mg/dL (ref 0.61–1.24)
GFR, Estimated: >60 mL/min (ref >60)
Glucose, Bld: 98 mg/dL (ref 70–99)
Potassium: 4 mmol/L (ref 3.5–5.1)
Sodium: 141 mmol/L (ref 135–145)
Total Bilirubin: 0.4 mg/dL (ref 0.3–1.2)
Total Protein: 7 g/dL (ref 6.5–8.1)

## 2022-10-09 LAB — CBC WITH DIFFERENTIAL/PLATELET
Abs Immature Granulocytes: 0.02 10*3/uL (ref 0.00–0.07)
Basophils Absolute: 0 10*3/uL (ref 0.0–0.1)
Basophils Relative: 1 %
Eosinophils Absolute: 0.1 10*3/uL (ref 0.0–0.5)
Eosinophils Relative: 1 %
HCT: 37.4 % — ABNORMAL LOW (ref 39.0–52.0)
Hemoglobin: 11.8 g/dL — ABNORMAL LOW (ref 13.0–17.0)
Immature Granulocytes: 0 %
Lymphocytes Relative: 12 %
Lymphs Abs: 0.9 10*3/uL (ref 0.7–4.0)
MCH: 29.2 pg (ref 26.0–34.0)
MCHC: 31.6 g/dL (ref 30.0–36.0)
MCV: 92.6 fL (ref 80.0–100.0)
Monocytes Absolute: 0.5 10*3/uL (ref 0.1–1.0)
Monocytes Relative: 7 %
Neutro Abs: 5.6 10*3/uL (ref 1.7–7.7)
Neutrophils Relative %: 79 %
Platelets: 152 10*3/uL (ref 150–400)
RBC: 4.04 MIL/uL — ABNORMAL LOW (ref 4.22–5.81)
RDW: 16.2 % — ABNORMAL HIGH (ref 11.5–15.5)
WBC: 7 10*3/uL (ref 4.0–10.5)
nRBC: 0.3 % — ABNORMAL HIGH (ref 0.0–0.2)

## 2022-10-09 LAB — RAPID URINE DRUG SCREEN, HOSP PERFORMED
Amphetamines: NOT DETECTED
Barbiturates: NOT DETECTED
Benzodiazepines: NOT DETECTED
Cocaine: POSITIVE — AB
Opiates: NOT DETECTED
Tetrahydrocannabinol: NOT DETECTED

## 2022-10-09 LAB — ACETAMINOPHEN LEVEL: Acetaminophen (Tylenol), Serum: <10 ug/mL — ABNORMAL LOW (ref 10–30)

## 2022-10-09 LAB — ETHANOL: Alcohol, Ethyl (B): <10 mg/dL (ref <10)

## 2022-10-09 LAB — SALICYLATE LEVEL: Salicylate Lvl: <7 mg/dL — ABNORMAL LOW (ref 7.0–30.0)

## 2022-10-09 LAB — CBG MONITORING, ED: Glucose-Capillary: 149 mg/dL — ABNORMAL HIGH (ref 70–99)

## 2022-10-09 MED ORDER — SODIUM CHLORIDE 0.9 % IV BOLUS
1000.0000 mL | Freq: Once | INTRAVENOUS | Status: AC
  Administered 2022-10-09: 1000 mL via INTRAVENOUS

## 2022-10-09 MED ORDER — ONDANSETRON HCL 4 MG/2ML IJ SOLN
4.0000 mg | Freq: Once | INTRAMUSCULAR | Status: AC
  Administered 2022-10-09: 4 mg via INTRAVENOUS
  Filled 2022-10-09: qty 2

## 2022-10-09 MED ORDER — NALOXONE HCL 4 MG/0.1ML NA LIQD: NASAL | 1 refills | Status: DC

## 2022-10-09 NOTE — ED Triage Notes (Signed)
Patient vomiting.
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
## 2022-10-09 NOTE — Discharge Instructions (Signed)
It was a pleasure caring for you today in the emergency department. ° °Please return to the emergency department for any worsening or worrisome symptoms. ° ° °
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
## 2022-11-26 ENCOUNTER — Encounter: Payer: Self-pay | Admitting: *Deleted

## 2022-11-26 NOTE — Progress Notes (Signed)
Unable to contact pt by phone (no answer, no option for vm) - [facility] documentation review indicates pt seen in ED since last communication but no evidence of contact or f/u with a PCP. 2nd letter sent 12/03/22 offering PCP contact info, and SDOH questionnaire in case pt is encountering barriers to healthcare access

## 2022-12-25 ENCOUNTER — Encounter (HOSPITAL_COMMUNITY): Payer: Self-pay

## 2022-12-25 ENCOUNTER — Observation Stay (HOSPITAL_COMMUNITY): Payer: Commercial Managed Care - HMO

## 2022-12-25 ENCOUNTER — Other Ambulatory Visit: Payer: Self-pay

## 2022-12-25 ENCOUNTER — Emergency Department (HOSPITAL_COMMUNITY): Payer: Commercial Managed Care - HMO

## 2022-12-25 ENCOUNTER — Inpatient Hospital Stay (HOSPITAL_COMMUNITY)
Admission: EM | Admit: 2022-12-25 | Discharge: 2022-12-31 | DRG: 064 | Disposition: A | Discharge status: Rehabilitation Facility | Payer: Commercial Managed Care - HMO | Attending: Internal Medicine | Admitting: Internal Medicine

## 2022-12-25 DIAGNOSIS — I639 Cerebral infarction, unspecified: Secondary | ICD-10-CM | POA: Diagnosis present

## 2022-12-25 DIAGNOSIS — Z8673 Personal history of transient ischemic attack (TIA), and cerebral infarction without residual deficits: Secondary | ICD-10-CM

## 2022-12-25 DIAGNOSIS — Z79899 Other long term (current) drug therapy: Secondary | ICD-10-CM

## 2022-12-25 DIAGNOSIS — Z83438 Family history of other disorder of lipoprotein metabolism and other lipidemia: Secondary | ICD-10-CM

## 2022-12-25 DIAGNOSIS — R4587 Impulsiveness: Secondary | ICD-10-CM | POA: Diagnosis not present

## 2022-12-25 DIAGNOSIS — I420 Dilated cardiomyopathy: Secondary | ICD-10-CM | POA: Diagnosis present

## 2022-12-25 DIAGNOSIS — Z720 Tobacco use: Secondary | ICD-10-CM | POA: Diagnosis present

## 2022-12-25 DIAGNOSIS — A523 Neurosyphilis, unspecified: Secondary | ICD-10-CM

## 2022-12-25 DIAGNOSIS — Q278 Other specified congenital malformations of peripheral vascular system: Secondary | ICD-10-CM

## 2022-12-25 DIAGNOSIS — I429 Cardiomyopathy, unspecified: Secondary | ICD-10-CM

## 2022-12-25 DIAGNOSIS — E7800 Pure hypercholesterolemia, unspecified: Secondary | ICD-10-CM | POA: Diagnosis present

## 2022-12-25 DIAGNOSIS — G4733 Obstructive sleep apnea (adult) (pediatric): Secondary | ICD-10-CM | POA: Diagnosis present

## 2022-12-25 DIAGNOSIS — I63511 Cerebral infarction due to unspecified occlusion or stenosis of right middle cerebral artery: Principal | ICD-10-CM | POA: Insufficient documentation

## 2022-12-25 DIAGNOSIS — R29709 NIHSS score 9: Secondary | ICD-10-CM | POA: Diagnosis present

## 2022-12-25 DIAGNOSIS — F1410 Cocaine abuse, uncomplicated: Secondary | ICD-10-CM | POA: Diagnosis present

## 2022-12-25 DIAGNOSIS — Z59819 Housing instability, housed unspecified: Secondary | ICD-10-CM

## 2022-12-25 DIAGNOSIS — I5022 Chronic systolic (congestive) heart failure: Secondary | ICD-10-CM | POA: Diagnosis present

## 2022-12-25 DIAGNOSIS — I10 Essential (primary) hypertension: Secondary | ICD-10-CM | POA: Diagnosis not present

## 2022-12-25 DIAGNOSIS — G936 Cerebral edema: Secondary | ICD-10-CM | POA: Diagnosis present

## 2022-12-25 DIAGNOSIS — I340 Nonrheumatic mitral (valve) insufficiency: Secondary | ICD-10-CM | POA: Diagnosis present

## 2022-12-25 DIAGNOSIS — F1210 Cannabis abuse, uncomplicated: Secondary | ICD-10-CM | POA: Diagnosis present

## 2022-12-25 DIAGNOSIS — R29810 Facial weakness: Secondary | ICD-10-CM | POA: Diagnosis present

## 2022-12-25 DIAGNOSIS — F17210 Nicotine dependence, cigarettes, uncomplicated: Secondary | ICD-10-CM | POA: Diagnosis present

## 2022-12-25 DIAGNOSIS — G8194 Hemiplegia, unspecified affecting left nondominant side: Secondary | ICD-10-CM | POA: Diagnosis present

## 2022-12-25 DIAGNOSIS — Z8249 Family history of ischemic heart disease and other diseases of the circulatory system: Secondary | ICD-10-CM

## 2022-12-25 DIAGNOSIS — Z833 Family history of diabetes mellitus: Secondary | ICD-10-CM

## 2022-12-25 DIAGNOSIS — I110 Hypertensive heart disease with heart failure: Secondary | ICD-10-CM | POA: Diagnosis present

## 2022-12-25 HISTORY — DX: Cannabis abuse, uncomplicated: F12.10

## 2022-12-25 HISTORY — DX: Suicidal ideations: R45.851

## 2022-12-25 HISTORY — DX: Personal history of transient ischemic attack (TIA), and cerebral infarction without residual deficits: Z86.73

## 2022-12-25 HISTORY — DX: Poisoning by unspecified drugs, medicaments and biological substances, accidental (unintentional), initial encounter: T50.901A

## 2022-12-25 HISTORY — DX: Cocaine abuse, uncomplicated: F14.10

## 2022-12-25 LAB — COMPREHENSIVE METABOLIC PANEL
ALT: 22 U/L (ref 0–44)
AST: 31 U/L (ref 15–41)
Albumin: 2.8 g/dL — ABNORMAL LOW (ref 3.5–5.0)
Alkaline Phosphatase: 50 U/L (ref 38–126)
Anion gap: 12 (ref 5–15)
BUN: 19 mg/dL (ref 6–20)
CO2: 22 mmol/L (ref 22–32)
Calcium: 8.8 mg/dL — ABNORMAL LOW (ref 8.9–10.3)
Chloride: 107 mmol/L (ref 98–111)
Creatinine, Ser: 0.96 mg/dL (ref 0.61–1.24)
GFR, Estimated: >60 mL/min (ref >60)
Glucose, Bld: 110 mg/dL — ABNORMAL HIGH (ref 70–99)
Potassium: 3.9 mmol/L (ref 3.5–5.1)
Sodium: 141 mmol/L (ref 135–145)
Total Bilirubin: 0.5 mg/dL (ref 0.3–1.2)
Total Protein: 6.6 g/dL (ref 6.5–8.1)

## 2022-12-25 LAB — CBC
HCT: 37 % — ABNORMAL LOW (ref 39.0–52.0)
Hemoglobin: 12.2 g/dL — ABNORMAL LOW (ref 13.0–17.0)
MCH: 29 pg (ref 26.0–34.0)
MCHC: 33 g/dL (ref 30.0–36.0)
MCV: 87.9 fL (ref 80.0–100.0)
Platelets: 267 10*3/uL (ref 150–400)
RBC: 4.21 MIL/uL — ABNORMAL LOW (ref 4.22–5.81)
RDW: 15.3 % (ref 11.5–15.5)
WBC: 5.6 10*3/uL (ref 4.0–10.5)
nRBC: 0 % (ref 0.0–0.2)

## 2022-12-25 LAB — RAPID URINE DRUG SCREEN, HOSP PERFORMED
Amphetamines: NOT DETECTED
Barbiturates: NOT DETECTED
Benzodiazepines: NOT DETECTED
Cocaine: POSITIVE — AB
Opiates: NOT DETECTED
Tetrahydrocannabinol: POSITIVE — AB

## 2022-12-25 LAB — DIFFERENTIAL
Abs Immature Granulocytes: 0.01 10*3/uL (ref 0.00–0.07)
Basophils Absolute: 0 10*3/uL (ref 0.0–0.1)
Basophils Relative: 1 %
Eosinophils Absolute: 0.1 10*3/uL (ref 0.0–0.5)
Eosinophils Relative: 1 %
Immature Granulocytes: 0 %
Lymphocytes Relative: 30 %
Lymphs Abs: 1.7 10*3/uL (ref 0.7–4.0)
Monocytes Absolute: 0.4 10*3/uL (ref 0.1–1.0)
Monocytes Relative: 7 %
Neutro Abs: 3.4 10*3/uL (ref 1.7–7.7)
Neutrophils Relative %: 61 %

## 2022-12-25 LAB — I-STAT CHEM 8, ED
BUN: 20 mg/dL (ref 6–20)
Calcium, Ion: 1.18 mmol/L (ref 1.15–1.40)
Chloride: 105 mmol/L (ref 98–111)
Creatinine, Ser: 1.1 mg/dL (ref 0.61–1.24)
Glucose, Bld: 110 mg/dL — ABNORMAL HIGH (ref 70–99)
HCT: 37 % — ABNORMAL LOW (ref 39.0–52.0)
Hemoglobin: 12.6 g/dL — ABNORMAL LOW (ref 13.0–17.0)
Potassium: 3.7 mmol/L (ref 3.5–5.1)
Sodium: 142 mmol/L (ref 135–145)
TCO2: 27 mmol/L (ref 22–32)

## 2022-12-25 LAB — URINALYSIS, ROUTINE W REFLEX MICROSCOPIC
Bilirubin Urine: NEGATIVE
Glucose, UA: NEGATIVE mg/dL
Hgb urine dipstick: NEGATIVE
Ketones, ur: NEGATIVE mg/dL
Leukocytes,Ua: NEGATIVE
Nitrite: NEGATIVE
Protein, ur: NEGATIVE mg/dL
Specific Gravity, Urine: 1.025 (ref 1.005–1.030)
pH: 5.5 (ref 5.0–8.0)

## 2022-12-25 LAB — ETHANOL: Alcohol, Ethyl (B): <10 mg/dL (ref <10)

## 2022-12-25 LAB — HIV ANTIBODY (ROUTINE TESTING W REFLEX): HIV Screen 4th Generation wRfx: NONREACTIVE

## 2022-12-25 LAB — PROTIME-INR
INR: 1.1 (ref 0.8–1.2)
Prothrombin Time: 14.1 seconds (ref 11.4–15.2)

## 2022-12-25 LAB — APTT: aPTT: 31 seconds (ref 24–36)

## 2022-12-25 MED ORDER — ASPIRIN 81 MG PO TBEC
81.0000 mg | DELAYED_RELEASE_TABLET | Freq: Every day | ORAL | Status: DC
  Administered 2022-12-25 – 2022-12-31 (×7): 81 mg via ORAL
  Filled 2022-12-25 (×7): qty 1

## 2022-12-25 MED ORDER — IOHEXOL 350 MG/ML SOLN
75.0000 mL | Freq: Once | INTRAVENOUS | Status: AC | PRN
  Administered 2022-12-25: 75 mL via INTRAVENOUS

## 2022-12-25 MED ORDER — ENOXAPARIN SODIUM 40 MG/0.4ML IJ SOSY
40.0000 mg | PREFILLED_SYRINGE | INTRAMUSCULAR | Status: DC
  Administered 2022-12-25 – 2022-12-30 (×6): 40 mg via SUBCUTANEOUS
  Filled 2022-12-25 (×6): qty 0.4

## 2022-12-25 MED ORDER — SENNOSIDES-DOCUSATE SODIUM 8.6-50 MG PO TABS: 1.0000 | ORAL_TABLET | Freq: Every evening | ORAL | Status: DC | PRN

## 2022-12-25 MED ORDER — LORAZEPAM 2 MG/ML IJ SOLN
1.0000 mg | INTRAMUSCULAR | Status: AC
  Administered 2022-12-25: 1 mg via INTRAVENOUS
  Filled 2022-12-25: qty 1

## 2022-12-25 MED ORDER — CLOPIDOGREL BISULFATE 75 MG PO TABS
75.0000 mg | ORAL_TABLET | Freq: Every day | ORAL | Status: DC
  Administered 2022-12-25 – 2022-12-31 (×7): 75 mg via ORAL
  Filled 2022-12-25 (×7): qty 1

## 2022-12-25 MED ORDER — NICOTINE 21 MG/24HR TD PT24
21.0000 mg | MEDICATED_PATCH | Freq: Every day | TRANSDERMAL | Status: DC
  Filled 2022-12-25 (×5): qty 1

## 2022-12-25 MED ORDER — STROKE: EARLY STAGES OF RECOVERY BOOK
Freq: Once | Status: AC
  Administered 2022-12-26: 1
  Filled 2022-12-25: qty 1

## 2022-12-25 MED ORDER — NICOTINE 14 MG/24HR TD PT24
14.0000 mg | MEDICATED_PATCH | Freq: Once | TRANSDERMAL | Status: AC
  Administered 2022-12-25: 14 mg via TRANSDERMAL
  Filled 2022-12-25: qty 1

## 2022-12-25 MED ORDER — ACETAMINOPHEN 650 MG RE SUPP: 650.0000 mg | RECTAL | Status: DC | PRN

## 2022-12-25 MED ORDER — ACETAMINOPHEN 325 MG PO TABS
650.0000 mg | ORAL_TABLET | ORAL | Status: DC | PRN
  Administered 2022-12-28 – 2022-12-29 (×2): 650 mg via ORAL
  Filled 2022-12-25 (×2): qty 2

## 2022-12-25 MED ORDER — ACETAMINOPHEN 160 MG/5ML PO SOLN: 650.0000 mg | ORAL | Status: DC | PRN

## 2022-12-25 MED ORDER — SODIUM CHLORIDE 0.9 % IV SOLN: INTRAVENOUS | Status: DC

## 2022-12-25 NOTE — ED Notes (Addendum)
Pt pulled IV out. Pt continues to be irritated and asking to leave to go smoke a cigarette. Pt given a urinal to urinate if he needs to go.
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
## 2022-12-25 NOTE — ED Notes (Signed)
Per Erskine Emery, MD hold off on MRI until neurology sees patient due to patient being agitated and possibly not being able to stay still for the MRI.

## 2022-12-25 NOTE — Progress Notes (Signed)
Returned from CT and handoff to night shift RN completed

## 2022-12-25 NOTE — ED Provider Triage Note (Signed)
Emergency Medicine Provider Triage Evaluation Note  Jeremy Fritz , a 42 y.o. male  was evaluated in triage.  Pt complains of left facial droop, left arm weakness. Patients mom states a friend told her this started yesterday, however patient states that this started a few weeks ago. Denies history of same. Patient states his left arm is weak because his shoulder is sore. Left arm is notably cold, right is warm. Patient states 'its cold outside.' Denies headache or vision changes. Patient agitated and non cooperative but alert and oriented.   Review of Systems  Positive:  Negative:   Physical Exam  Ht 6' (1.829 m)   Wt 74.8 kg   BMI 22.37 kg/m  Gen:   Awake, no distress   Resp:  Normal effort  MSK:   Moves extremities without difficulty  Other:  Left facial droop, left arm weakness. Left arm is cold, right arm is warm. Radial and ulnar pulses 2+ on the left. Left leg with good strength. Sensation intact throughout.   Medical Decision Making  Medically screening exam initiated at 10:36 AM.  Appropriate orders placed.  SAURABH CARMONA was informed that the remainder of the evaluation will be completed by another provider, this initial triage assessment does not replace that evaluation, and the importance of remaining in the ED until their evaluation is complete.  Given patient's symptoms ongoing for several weeks, no code stroke at this time. Work-up initiated.   Bud Face, PA-C 12/25/22 1041

## 2022-12-25 NOTE — ED Provider Notes (Signed)
[facility] Provider Note   CSN: [id number] Arrival date & time: 12/25/22  1023     History  Chief Complaint  Patient presents with   Stroke Symptoms    Jeremy Fritz is a 42 y.o. male.  Patient p/f left sided weakness and facial droop ongoing for a few weeks. In room, patient does not want to cooperate with questions and is agitated. He will give minimal information stating that he wants to go home. Agreed to further assessment after time in ED. He reports that he has no pain in the left arm but is unable to grip and has not had any issues with dysarthria, no fever or chills.   The history is provided by the patient and a relative.       Home Medications Prior to Admission medications   Medication Sig Start Date End Date Taking? Authorizing Provider  naloxone [facility]) nasal spray 4 mg/0.1 mL Take in case of overdose, call 911 10/09/22   Jeanell Sparrow, DO      Allergies    Patient has no known allergies.    Review of Systems   Review of Systems  Constitutional:  Negative for chills and fever.  HENT:  Negative for ear pain and sore throat.   Eyes:  Negative for pain and visual disturbance.  Respiratory:  Negative for cough and shortness of breath.   Cardiovascular:  Negative for chest pain and palpitations.  Gastrointestinal:  Negative for abdominal pain and vomiting.  Genitourinary:  Negative for dysuria and hematuria.  Musculoskeletal:        LUE weakness   Skin:  Negative for color change and rash.  Neurological:  Positive for facial asymmetry and weakness. Negative for dizziness, seizures, syncope, light-headedness and headaches.  Psychiatric/Behavioral:  Positive for agitation. Negative for confusion and decreased concentration.   All other systems reviewed and are negative.   Physical Exam Updated Vital Signs BP (!) 131/109   Pulse 86   Temp 98.6 F (37 C)   Resp 18   Ht 6' (1.829 m)   Wt 74.8 kg   SpO2  99%   BMI 22.37 kg/m  Physical Exam Vitals and nursing note reviewed.  Constitutional:      Appearance: He is well-developed.     Comments: Disheveled in appearance. Redirectable to questioning    HENT:     Head: Normocephalic and atraumatic.  Eyes:     Conjunctiva/sclera: Conjunctivae normal.     Pupils: Pupils are equal, round, and reactive to light.  Cardiovascular:     Rate and Rhythm: Normal rate and regular rhythm.     Heart sounds: No murmur heard. Pulmonary:     Effort: Pulmonary effort is normal. No respiratory distress.     Breath sounds: Normal breath sounds.  Abdominal:     Palpations: Abdomen is soft.     Tenderness: There is no abdominal tenderness.  Musculoskeletal:        General: No swelling.     Cervical back: Neck supple.  Skin:    General: Skin is warm and dry.     Capillary Refill: Capillary refill takes less than 2 seconds.  Neurological:     Mental Status: He is alert and oriented to person, place, and time.     Sensory: No sensory deficit.     Gait: Gait normal.     Comments: Able to lift arm against gravity but no grip strength or strength otherwise in  LUE Able to shrug shoulder  LLE 4/5, RLE 5/5, RUE 5/5  Psychiatric:     Comments: Agitated behavior      ED Results / Procedures / Treatments   Labs (all labs ordered are listed, but only abnormal results are displayed) Labs Reviewed  CBC - Abnormal; Notable for the following components:      Result Value   RBC 4.21 (*)    Hemoglobin 12.2 (*)    HCT 37.0 (*)    All other components within normal limits  COMPREHENSIVE METABOLIC PANEL - Abnormal; Notable for the following components:   Glucose, Bld 110 (*)    Calcium 8.8 (*)    Albumin 2.8 (*)    All other components within normal limits  I-STAT CHEM 8, ED - Abnormal; Notable for the following components:   Glucose, Bld 110 (*)    Hemoglobin 12.6 (*)    HCT 37.0 (*)    All other components within normal limits  ETHANOL  PROTIME-INR   APTT  DIFFERENTIAL  RAPID URINE DRUG SCREEN, HOSP PERFORMED  URINALYSIS, ROUTINE W REFLEX MICROSCOPIC    EKG None  Radiology CT HEAD WO CONTRAST  Result Date: 12/25/2022 CLINICAL DATA:  Neuro deficit, acute, stroke suspected EXAM: CT HEAD WITHOUT CONTRAST TECHNIQUE: Contiguous axial images were obtained from the base of the skull through the vertex without intravenous contrast. RADIATION DOSE REDUCTION: This exam was performed according to the departmental dose-optimization program which includes automated exposure control, adjustment of the mA and/or kV according to patient size and/or use of iterative reconstruction technique. COMPARISON:  None Available. FINDINGS: Brain: Abnormal edema and loss of gray differentiation involving the posterior right insula as well as overlying frontal and parietal lobes, compatible with posterior right MCA territory infarct. Regional mass effect with slight leftward midline shift. No evidence of mass lesion or hydrocephalus. No mass occupying acute hemorrhage. Vascular: Probable hyperdense vessel within a right M2 MCA branch. Skull: No acute fracture. Sinuses/Orbits: Largely clear sinuses.  No acute orbital findings. Other: No mastoid effusions. IMPRESSION: 1. Acute or subacute right posterior MCA territory infarct. Regional mass effect with slight leftward midline shift. MRI could further characterize. 2. Probable hyperdense vessel within a right M2 MCA branch, suspicious for thrombus. A CTA could further evaluate. Electronically Signed   By: Margaretha Sheffield M.D.   On: 12/25/2022 13:03    Procedures Procedures    Medications Ordered in ED Medications  nicotine (NICODERM CQ - dosed in mg/24 hours) patch 14 mg (14 mg Transdermal Patch Applied 12/25/22 1515)    ED Course/ Medical Decision Making/ A&P                             Medical Decision Making Medical Decision Making:   Jeremy Fritz is a 42 y.o. male who presented to the ED today with left  sided weakness ongoing for a few weeks detailed above.    Additional history discussed with patient's family/caregivers. Patient is agitated.  Complete initial physical exam performed, notably the patient was agitated with complete left upper extremity weakness, only able to lift from gravity.    Reviewed and confirmed nursing documentation for past medical history, family history, social history.    Initial Assessment:   With the patient's presentation of weakness, most likely diagnosis is CVA. Other diagnoses were considered. However, CT scan proved CVA as the culprit of symptoms.  This is most consistent with an acute complicated illness  Initial Plan:   Screening labs including CBC and Metabolic panel to evaluate for infectious or metabolic etiology of disease.  Head CT to evaluate for structural/ischemic pathology.  EKG to evaluate for cardiac pathology Objective evaluation as below reviewed   Initial Study Results:   Laboratory  All laboratory results reviewed without evidence of clinically relevant pathology.   Exceptions include: Hgb 12.2   EKG EKG was reviewed independently. Rate, rhythm, axis, intervals all examined and without medically relevant abnormality. ST segments without concerns for elevations.    Radiology:  All images reviewed independently. Agree with radiology report at this time.  CT HEAD WO CONTRAST IMPRESSION: 1. Acute or subacute right posterior MCA territory infarct. Regional mass effect with slight leftward midline shift. MRI could further characterize. 2. Probable hyperdense vessel within a right M2 MCA branch, suspicious for thrombus. A CTA could further evaluate.    Consults: Case discussed with neurology, Dr. Quinn Axe, who will come see the patient.   1400: Patient not amenable to MRI and asking to smoke. Gave patient a nicotine patch   1533: Dr. Quinn Axe to see at bedside. Will call and admit to hospitalist service with further work up through  neurology.  7 Spoke to hospitalist team for admission to inpatient service, neuro to follow.   Clinical Impression: CVA   Amount and/or Complexity of Data Reviewed Radiology: ordered.  Risk OTC drugs.          Final Clinical Impression(s) / ED Diagnoses Final diagnoses:  Cerebral infarction, unspecified mechanism [facility])    Rx / [place] Orders ED Discharge Orders     None         Erskine Emery, MD 12/25/22 1634    Carmin Muskrat, MD 12/25/22 [phone]
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
## 2022-12-25 NOTE — Progress Notes (Signed)
Off the floor to CT 
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
## 2022-12-25 NOTE — ED Notes (Signed)
Pt agreeable to getting blood work drawn. Pt irritable and making quick movements with arms/legs. Pt not agreeable to VS or neuro assessment at this time. Pt asked questions and patient smacked lips and said to his visitor "see this what they doing." Explained to patient that we have a process to complete. Pt given a cup of water with ice. Pt mother stated that patient attempted to pick up the cup and spilled it everywhere.

## 2022-12-25 NOTE — ED Notes (Signed)
Up to floor, mother at [facility] speaking with med rec tech. Pt alert, NAD, calm, interactive.

## 2022-12-25 NOTE — ED Triage Notes (Signed)
Pt's mom states pt has left facial droop and left arm weakness. Pt's mom states pt's friend told her it started yesterday, but pt states it's been gong on for several weeks. Pt is agitated and not very cooperative in triage. Pt is A&Ox4

## 2022-12-26 ENCOUNTER — Observation Stay (HOSPITAL_COMMUNITY): Payer: Commercial Managed Care - HMO

## 2022-12-26 ENCOUNTER — Encounter (HOSPITAL_COMMUNITY): Payer: Self-pay | Admitting: Internal Medicine

## 2022-12-26 DIAGNOSIS — R29709 NIHSS score 9: Secondary | ICD-10-CM | POA: Diagnosis present

## 2022-12-26 DIAGNOSIS — I63511 Cerebral infarction due to unspecified occlusion or stenosis of right middle cerebral artery: Secondary | ICD-10-CM

## 2022-12-26 DIAGNOSIS — I5021 Acute systolic (congestive) heart failure: Secondary | ICD-10-CM | POA: Diagnosis not present

## 2022-12-26 DIAGNOSIS — E7800 Pure hypercholesterolemia, unspecified: Secondary | ICD-10-CM | POA: Diagnosis present

## 2022-12-26 DIAGNOSIS — F1910 Other psychoactive substance abuse, uncomplicated: Secondary | ICD-10-CM | POA: Diagnosis not present

## 2022-12-26 DIAGNOSIS — A523 Neurosyphilis, unspecified: Secondary | ICD-10-CM | POA: Diagnosis present

## 2022-12-26 DIAGNOSIS — I340 Nonrheumatic mitral (valve) insufficiency: Secondary | ICD-10-CM

## 2022-12-26 DIAGNOSIS — I428 Other cardiomyopathies: Secondary | ICD-10-CM | POA: Diagnosis not present

## 2022-12-26 DIAGNOSIS — I639 Cerebral infarction, unspecified: Secondary | ICD-10-CM | POA: Diagnosis present

## 2022-12-26 DIAGNOSIS — Z83438 Family history of other disorder of lipoprotein metabolism and other lipidemia: Secondary | ICD-10-CM | POA: Diagnosis not present

## 2022-12-26 DIAGNOSIS — G936 Cerebral edema: Secondary | ICD-10-CM | POA: Diagnosis present

## 2022-12-26 DIAGNOSIS — I429 Cardiomyopathy, unspecified: Secondary | ICD-10-CM

## 2022-12-26 DIAGNOSIS — R29810 Facial weakness: Secondary | ICD-10-CM | POA: Diagnosis present

## 2022-12-26 DIAGNOSIS — G4733 Obstructive sleep apnea (adult) (pediatric): Secondary | ICD-10-CM | POA: Diagnosis present

## 2022-12-26 DIAGNOSIS — Z8673 Personal history of transient ischemic attack (TIA), and cerebral infarction without residual deficits: Secondary | ICD-10-CM | POA: Diagnosis not present

## 2022-12-26 DIAGNOSIS — F1410 Cocaine abuse, uncomplicated: Secondary | ICD-10-CM | POA: Diagnosis not present

## 2022-12-26 DIAGNOSIS — I5022 Chronic systolic (congestive) heart failure: Secondary | ICD-10-CM | POA: Diagnosis present

## 2022-12-26 DIAGNOSIS — I255 Ischemic cardiomyopathy: Secondary | ICD-10-CM | POA: Diagnosis not present

## 2022-12-26 DIAGNOSIS — G8194 Hemiplegia, unspecified affecting left nondominant side: Secondary | ICD-10-CM | POA: Diagnosis present

## 2022-12-26 DIAGNOSIS — Z79899 Other long term (current) drug therapy: Secondary | ICD-10-CM | POA: Diagnosis not present

## 2022-12-26 DIAGNOSIS — I10 Essential (primary) hypertension: Secondary | ICD-10-CM | POA: Diagnosis not present

## 2022-12-26 DIAGNOSIS — Q278 Other specified congenital malformations of peripheral vascular system: Secondary | ICD-10-CM | POA: Diagnosis not present

## 2022-12-26 DIAGNOSIS — I420 Dilated cardiomyopathy: Secondary | ICD-10-CM | POA: Diagnosis present

## 2022-12-26 DIAGNOSIS — Z833 Family history of diabetes mellitus: Secondary | ICD-10-CM | POA: Diagnosis not present

## 2022-12-26 DIAGNOSIS — F1210 Cannabis abuse, uncomplicated: Secondary | ICD-10-CM | POA: Diagnosis present

## 2022-12-26 DIAGNOSIS — F17210 Nicotine dependence, cigarettes, uncomplicated: Secondary | ICD-10-CM | POA: Diagnosis present

## 2022-12-26 DIAGNOSIS — Z59819 Housing instability, housed unspecified: Secondary | ICD-10-CM | POA: Diagnosis not present

## 2022-12-26 DIAGNOSIS — R4587 Impulsiveness: Secondary | ICD-10-CM | POA: Diagnosis not present

## 2022-12-26 DIAGNOSIS — Z720 Tobacco use: Secondary | ICD-10-CM | POA: Diagnosis not present

## 2022-12-26 DIAGNOSIS — Z8249 Family history of ischemic heart disease and other diseases of the circulatory system: Secondary | ICD-10-CM | POA: Diagnosis not present

## 2022-12-26 DIAGNOSIS — I110 Hypertensive heart disease with heart failure: Secondary | ICD-10-CM | POA: Diagnosis present

## 2022-12-26 DIAGNOSIS — A539 Syphilis, unspecified: Secondary | ICD-10-CM | POA: Diagnosis not present

## 2022-12-26 LAB — LIPID PANEL
Cholesterol: 164 mg/dL (ref 0–200)
HDL: 39 mg/dL — ABNORMAL LOW (ref >40)
LDL Cholesterol: 109 mg/dL — ABNORMAL HIGH (ref 0–99)
Total CHOL/HDL Ratio: 4.2 RATIO
Triglycerides: 82 mg/dL (ref <150)
VLDL: 16 mg/dL (ref 0–40)

## 2022-12-26 LAB — ECHOCARDIOGRAM COMPLETE
AR max vel: 2.88 cm2
AV Area VTI: 2.4 cm2
AV Area mean vel: 2.62 cm2
AV Mean grad: 4 mmHg
AV Peak grad: 7.1 mmHg
Ao pk vel: 1.33 m/s
Height: 72 in
S' Lateral: 5.4 cm
Weight: 2638.47 oz

## 2022-12-26 LAB — RPR
RPR Ser Ql: REACTIVE — AB
RPR Titer: 1:64 {titer}

## 2022-12-26 MED ORDER — CARVEDILOL 3.125 MG PO TABS
3.1250 mg | ORAL_TABLET | Freq: Two times a day (BID) | ORAL | Status: DC
  Administered 2022-12-27 – 2022-12-31 (×9): 3.125 mg via ORAL
  Filled 2022-12-26 (×9): qty 1

## 2022-12-26 MED ORDER — LOSARTAN POTASSIUM 25 MG PO TABS
12.5000 mg | ORAL_TABLET | Freq: Every day | ORAL | Status: DC
  Administered 2022-12-26 – 2022-12-31 (×6): 12.5 mg via ORAL
  Filled 2022-12-26 (×6): qty 1

## 2022-12-26 MED ORDER — PENICILLIN G POTASSIUM 20000000 UNITS IJ SOLR
12.0000 10*6.[IU] | Freq: Two times a day (BID) | INTRAVENOUS | Status: DC
  Administered 2022-12-26 – 2022-12-31 (×11): 12 10*6.[IU] via INTRAVENOUS
  Filled 2022-12-26 (×12): qty 12

## 2022-12-26 NOTE — Progress Notes (Addendum)
STROKE TEAM PROGRESS NOTE   INTERVAL HISTORY His mother is at the bedside.   Patient presented to ED 2/29 with 1 week of left-sided weakness.  He was found to have a right MCA infarct on CT, confirmed on MRI this morning.  Patient was outside the window for interventions.  Tox screen positive for cocaine and THC. RPR lab positive.   On exam, left-sided weakness continues, drowsy.  Plan of care reviewed with patient and mother at bedside, including smoking and polysubstance use cessation education. MRI scan of the brain shows a large right MCA infarct.  Urine drug screen is positive for marijuana and cocaine.  He is also RPR positive. Vitals:   12/25/22 1755 12/25/22 2300 12/26/22 0342 12/26/22 0800  BP: 119/79 111/73 124/85 (!) 135/101  Pulse: 95 99 95 96  Resp: '16 16 16 16  '$ Temp: 98.9 F (37.2 C) 98.9 F (37.2 C) 98.4 F (36.9 C) 98.2 F (36.8 C)  TempSrc: Oral Oral  Oral  SpO2: 99% 94% 97% 97%  Weight:      Height:       CBC:  Recent Labs  Lab 12/25/22 1237 12/25/22 1329  WBC  --  5.6  NEUTROABS  --  3.4  HGB 12.6* 12.2*  HCT 37.0* 37.0*  MCV  --  87.9  PLT  --  99991111   Basic Metabolic Panel:  Recent Labs  Lab 12/25/22 1237 12/25/22 1329  NA 142 141  K 3.7 3.9  CL 105 107  CO2  --  22  GLUCOSE 110* 110*  BUN 20 19  CREATININE 1.10 0.96  CALCIUM  --  8.8*   Lipid Panel: No results for input(s): "CHOL", "TRIG", "HDL", "CHOLHDL", "VLDL", "LDLCALC" in the last 168 hours. HgbA1c: No results for input(s): "HGBA1C" in the last 168 hours. Urine Drug Screen:  Recent Labs  Lab 12/25/22 2014  LABOPIA NONE DETECTED  COCAINSCRNUR POSITIVE*  LABBENZ NONE DETECTED  AMPHETMU NONE DETECTED  THCU POSITIVE*  LABBARB NONE DETECTED    Alcohol Level  Recent Labs  Lab 12/25/22 1328  ETH <10    IMAGING past 24 hours MR BRAIN WO CONTRAST  Result Date: 12/26/2022 CLINICAL DATA:  Presented on 02/29 with left arm weakness. EXAM: MRI HEAD WITHOUT CONTRAST TECHNIQUE:  Multiplanar, multiecho pulse sequences of the brain and surrounding structures were obtained without intravenous contrast. COMPARISON:  CT/CTA head and neck 12/25/2022 FINDINGS: Incomplete study with axial and coronal DWI, motion degraded axial T2 and FLAIR, and motion degraded axial GRE sequences obtained. Brain: There is moderate-to-large area of diffusion restriction in the right MCA distribution involving the posterior temporal lobe, insula, external capsule, and corona radiata consistent with acute infarct. There are also punctate acute infarcts in the high right parietal cortex. There is associated FLAIR signal abnormality and edema with sulcal effacement but no midline shift. There is no definite evidence of hemorrhage. Background parenchymal volume is normal. The ventricles are normal in size. Parenchymal signal is otherwise normal. Vascular: The major flow voids are normal on the provided sequences. Skull and upper cervical spine: Suboptimally evaluated on the provided sequences. Sinuses/Orbits: The paranasal sinuses are clear. The globes and orbits are grossly unremarkable. Other: None. IMPRESSION: Moderate-to-large area of acute infarct in the right MCA distribution with associated edema and regional sulcal effacement but no midline shift or definite hemorrhage. Electronically Signed   By: Valetta Mole M.D.   On: 12/26/2022 10:11   CT ANGIO HEAD NECK W WO CM  Result Date:  12/25/2022 CLINICAL DATA:  Initial evaluation for neuro deficit, stroke. EXAM: CT ANGIOGRAPHY HEAD AND NECK TECHNIQUE: Multidetector CT imaging of the head and neck was performed using the standard protocol during bolus administration of intravenous contrast. Multiplanar CT image reconstructions and MIPs were obtained to evaluate the vascular anatomy. Carotid stenosis measurements (when applicable) are obtained utilizing NASCET criteria, using the distal internal carotid diameter as the denominator. RADIATION DOSE REDUCTION: This  exam was performed according to the departmental dose-optimization program which includes automated exposure control, adjustment of the mA and/or kV according to patient size and/or use of iterative reconstruction technique. CONTRAST:  15m OMNIPAQUE IOHEXOL 350 MG/ML SOLN COMPARISON:  Prior CT from earlier the same day. FINDINGS: CTA NECK FINDINGS Aortic arch: Visualized aortic arch normal in caliber. Aberrant right subclavian artery noted. No stenosis about the origin of the great vessels. Right carotid system: Right common and internal carotid arteries are patent without dissection. Subtle transverse oriented Leigh shelf-like defect at the origin of the cervical right ICA, suspicious for a small carotid web (series 9, image 127). No significant stenosis. Left carotid system: Left common and internal carotid arteries are patent without stenosis or dissection. Vertebral arteries: Both vertebral arteries arise from subclavian arteries. No proximal subclavian artery stenosis. Left vertebral artery slightly dominant. Vertebral arteries are patent without stenosis or dissection. Skeleton: No discrete or worrisome osseous lesions. Mild cervical spondylosis without significant spinal stenosis. Other neck: No other acute soft tissue abnormality within the neck. Upper chest: Mild scattered ground-glass density with interlobular septal thickening within the visualized lungs, likely mild pulmonary interstitial congestion/edema. Visualized upper chest demonstrates no other acute finding. Review of the MIP images confirms the above findings CTA HEAD FINDINGS Anterior circulation: Both internal carotid arteries are patent to the termini without stenosis or other abnormality. A1 segments patent bilaterally. Normal anterior communicating artery complex. Anterior cerebral arteries patent without stenosis. No M1 stenosis or occlusion. Distal left MCA branches widely patent and well perfused. On the right, there is an acute proximal  stump right M3 occlusion, inferior division (series 7, image 134), in keeping with the evolving right MCA territory infarct. Remainder of the right MCA branches are patent and perfused. Posterior circulation: Minimal nonstenotic plaque noted within the dominant left V4 segment. Right V4 widely patent as well. Both PICA patent. Basilar widely patent without stenosis. Superior cerebellar arteries patent bilaterally. Both PCAs primarily supplied via the basilar. Short-segment mild proximal left P2 stenosis noted. PCAs otherwise widely patent to their distal aspects. Venous sinuses: Patent allowing for timing the contrast bolus. Anatomic variants: None significant.  No aneurysm. Review of the MIP images confirms the above findings IMPRESSION: 1. Acute proximal stump right M3 occlusion, inferior division, in keeping with the evolving right MCA territory infarct. 2. Otherwise wide patency of the major arterial vasculature of the head and neck. No other emergent finding. 3. Subtle transversely oriented shelf-like defect at the origin of the cervical right ICA, suspicious for a small carotid web. No significant stenosis. 4. Aberrant right subclavian artery. 5. Mild scattered ground-glass density with interlobular septal thickening within the visualized lungs, likely mild pulmonary interstitial congestion/edema Electronically Signed   By: BJeannine BogaM.D.   On: 12/25/2022 20:59    PHYSICAL EXAM  Temp:  [98.2 F (36.8 C)-98.9 F (37.2 C)] 98.2 F (36.8 C) (03/01 0800) Pulse Rate:  [86-99] 96 (03/01 0800) Resp:  [16-20] 16 (03/01 0800) BP: (108-135)/(69-109) 135/101 (03/01 0800) SpO2:  [94 %-99 %] 97 % (03/01 0800)  General -  Appears poorly nourished,  in no apparent distress. Cardiovascular - Regular rhythm and rate. Pulmonary -  unlabored breathing, no supplemental oxygen.   Mental Status -  Drowsy, flat affect, orientation to time, place, and person were intact. Language including expression,  naming, repetition, comprehension was assessed and found intact.  Cranial Nerves II - XII - II - Visual field intact OU. III, IV, VI - Extraocular movements intact. V - Facial sensation intact bilaterally. VII - Slight L facial droop  VIII - Hearing & vestibular intact bilaterally. X - Palate elevates symmetrically, no hoarseness. XI - Chin turning & shoulder shrug intact bilaterally. XII - Tongue protrusion intact.  Motor Strength - Left hemiplegia with drift seen in left arm and leg, increased in left arm.  Bulk was normal and fasciculations were absent.   Motor Tone - Muscle tone was assessed at the neck and appendages and was normal.  Sensory - Light touch assessed and symmetrical.    Coordination - The patient had normal movements in the hands and feet with no ataxia or dysmetria no right side, unable to assess left.  Tremor was absent.  Gait and Station - deferred.   ASSESSMENT/PLAN Mr. Jeremy Fritz is a 42 y.o. male with history significant for hypertension, polysubstance abuse including tobacco/THC/cocaine who presents with one week of L-sided weakness and found to have a R MCA infarct on CT head/MRI.   Acute Ischemic R MCA Stroke Etiology:  likely due to cocaine use, pending other labs and imaging for further etiologies CT head: Acute or subacute right posterior MCA territory infarct. Regional mass effect with slight leftward midline shift. MRI could further characterize. Probable hyperdense vessel within a right M2 MCA branch, suspicious for thrombus. CTA head & neck: Acute proximal stump right M3 occlusion, inferior division, in keeping with the evolving right MCA territory infarct. Otherwise wide patency of the major arterial vasculature of the head and neck. No other emergent finding. Subtle transversely oriented shelf-like defect at the origin of the cervical right ICA, suspicious for a small carotid web. No significant stenosis. Mild scattered ground-glass density  with interlobular septal thickening within the visualized lungs, likely mild pulmonary interstitial congestion/edema  MRI: Moderate-to-large area of acute infarct in the right MCA distribution with associated edema and regional sulcal effacement but no midline shift or definite hemorrhage  2D Echo w/bubble: pending  LDL No results found for requested labs within last 1095 days. HgbA1c No results found for requested labs within last 1095 days. VTE prophylaxis - SCDs    Diet   Diet Heart Room service appropriate? Yes; Fluid consistency: Thin   No antithrombotic prior to admission, now on ASA and Plavix daily. Recommend changing to solely ASA '81mg'$  for secondary stroke prevention.  Therapy recommendations: AIR Disposition:  pending Outpatient follow-up with Neurology post d/c  Hypertension Home meds:  norvasc '5mg'$ , losartan '100mg'$  (unsure of compliance) Unstable Permissive hypertension (OK if < 220/120) but gradually normalize in 5-7 days Long-term BP goal normotensive  Hyperlipidemia Home meds:  none,  LDL pending, goal < 70   Other Stroke Risk Factors Cigarette smoker, advised to stop smoking Cessation Education provided,  Substance abuse - UDS:  THC POSITIVE, Cocaine POSITIVE. Patient advised to stop using due to stroke risk. Cessation education provided, [facility] consult ordered for Substance Abuse Cessation Education   Other [facility] day # 0   Pt seen by Neuro NP/APP and later by MD. Note/plan to be edited by  MD as needed.    Otelia Santee, DNP, AGACNP-BC [facility] Please use AMION for pager and EPIC for messaging  I have personally obtained history,examined this patient, reviewed notes, independently viewed imaging studies, participated in medical decision making and plan of care.ROS completed by me personally and pertinent positives fully documented  I have made any additions or clarifications directly to the above note.  Agree with note above.  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  Recommend continue ongoing stroke workup.  Aspirin and Plavix for 3 weeks followed by aspirin alone and aggressive risk factor modification.  Patient counseled to quit smoking marijuana, using cocaine and maintaining healthy lifestyle.  Long discussion with patient and mother at the bedside and answered questions.  Greater than 50% time during this 50-minute visit was spent in counseling and coordination of care about his stroke and discussion about evaluation and treatment and answering questions.  Antony Contras, MD Medical Director [facility] Pager: [phone] 12/26/2022 2:22 PM  To contact Stroke Continuity provider, please refer to [email or web address] After hours, contact General Neurology
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
## 2022-12-26 NOTE — Consult Note (Addendum)
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  Approximately 1 to 2 weeks ago he started to notice left-sided weakness.  He ultimately presented yesterday and is felt to have stroke.  CTA of his head and neck showed acute proximal right M3 occlusion involving the right MCA territory infarct with an apparent right subclavian artery.  MRI showed moderate to large area of acute infarct in the right MCA distribution with associated edema without midline shift or hemorrhage.  He also tested positive for RPR treated with penicillin.  He admits to some shortness of breath but denies any chest pain.  2D echo Doppler study today revealed severe global LV dysfunction with EF 25 to 30% with severe LVH of the inferior segment.  There was no thrombus.  RV function was mildly reduced.  There was mild biatrial enlargement, moderate to severe MR and a small pericardial effusion.  Denies any IV drug use.  His blood pressure presently 124/85.  HEENT is notable for left facial droop, there is  no JVD or carotid bruits.  Lungs reveal decreased breath sounds without wheezing.  Rhythm is regular with a 1/6 to 2/6 systolic murmur at the apex consistent with his MR.  Abdomen is nontender.  There is a left.  Lower extremity weakness.  ECG shows normal sinus rhythm at 97 bpm, possible left atrial enlargement.  Incomplete right bundle branch block.  Left ventricular hypertrophy.  Presently, with his LV dysfunction will initiate carvedilol 3.125 mg as well as low-dose losartan at 12.5 mg daily.  Depending upon blood pressure response, medications can be further titrated and SGLT2 inhibition initiated with possible aldosterone blockade can be added to his current regimen.  Ultimately, ARB can be transition to [place].  He denies any anginal type symptoms.  However, coronary CTA may be considered to exclude significant CAD and potential ischemic etiology other than proceeding to initial cardiac catheterization.  He should be reevaluated for obstructive sleep apnea since he has been off therapy since his release from prison.  Troy Sine, MD, [facility] 12/26/2022 6:07 PM

## 2022-12-26 NOTE — Progress Notes (Addendum)
PROGRESS NOTE    Jeremy Fritz  [id number] DOB: February 20, 1981 DOA: 12/25/2022 PCP: Patient, No Pcp Per    Brief Narrative:   Jeremy Fritz is a 42 y.o. male with past medical history significant for essential hypertension, polysubstance abuse with tobacco/THC/cocaine who presented to [facility] ED on 2/29 with left arm weakness.  Patient reports onset roughly 1 week ago, he reports awoke from sleep and went to the fridge rater and tried to get a drink out and dropped it to the floor.  Patient did not seek care until his mother noticed his symptoms.  Patient endorses continued tobacco abuse.  He also reports that he has a history of high cholesterol currently not treated.  Also family history of diabetes, heart disease, hyperlipidemia.  Patient denies headache, no dizziness, no chest pain, no palpitations, no shortness of breath, no abdominal pain, no fever/chills/night sweats, no nausea/vomiting/diarrhea, no paresthesias.   In the ED, temperature 98.6 F, HR 99, RR 18, BP 145/97, SpO2 100% room air.  Sodium 141, potassium 3.9, chloride 107, CO2 22, glucose 110, BUN 19, creatinine 0.96.  AST 31, ALT 22, total bilirubin 0.5.  WBC 5.6, hemoglobin 12.2, platelet count 267.  INR 1.1.  EtOH level less than 10.  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  Neurology was consulted.  TRH consulted for admission for further evaluation management of acute versus subacute CVA.  Assessment & Plan:   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  MR brain with findings of moderate to  large area of acute infarct right MCA distribution with associated edema and regional sulcal effacement but no midline shift or definite hemorrhage.  -- Neurology following, appreciate assistance -- DAPT with aspirin 81 mg p.o. daily, Plavix 75 mg p.o. daily x 21 days followed by aspirin alone -- Echocardiogram: LVEF 25-30%,  -- Hemoglobin A1c: Pending -- Lipid panel: Pending -- PT/OT/SLP evaluation -- Monitor on telemetry -- Further per neurology  Cardiomyopathy TTE with LVEF 25-30%, LV severely decreased function, LV with global hypokinesis, LV moderately dilated, severe LVH, no LV thrombus visualized, RV systolic function mildly reduced, biatrial enlargement, small pericardial effusion which is circumferential, moderate/severe MR, no aortic stenosis, IVC dilated.  History of chronic cocaine/tobacco use.  Denies chest pain. --Cardiology consulted for further evaluation and possible ischemic workup   Essential hypertension Patient reports currently on losartan and amlodipine -- BP 124/85 this a.m., will continue to hold home losartan and amlodipine for now given current BP well-controlled off of antihypertensives  Polysubstance abuse with history of tobacco, cocaine, THC UDS positive for cocaine, THC.  Counseled on need for complete abstinence/cessation. 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 Juleen China.  Starting IV penicillin.  Recommend LP. -- T palladium antibodies: Pending   DVT prophylaxis: enoxaparin (LOVENOX) injection 40 mg Start: 12/25/22 2200 SCD's Start: 12/25/22 1814    Code Status: Full Code Family Communication:   Disposition Plan:  Level of care: Telemetry Medical Status is: Observation The patient remains OBS appropriate and will d/c before 2 midnights.    Consultants:  Neurology  Procedures:  TTE:  Antimicrobials:  None   Subjective: Patient seen examined at bedside.  Lying in bed.  RN present.  Continues with  flaccid paralysis of left upper extremity.  No other specific complaints this morning.  Denies headache, no chest pain, no shortness of breath, no abdominal pain.  No acute concerns overnight per nursing staff.  Objective: Vitals:   12/25/22 1755 12/25/22 2300 12/26/22 0342 12/26/22 0800  BP: 119/79 111/73 124/85 (!) 135/101  Pulse: 95 99 95 96  Resp: '16 16 16 16  '$ Temp: 98.9 F (37.2 C) 98.9 F (37.2 C) 98.4 F (36.9 C) 98.2 F (36.8 C)  TempSrc: Oral Oral  Oral  SpO2: 99% 94% 97% 97%  Weight:      Height:        Intake/Output Summary (Last 24 hours) at 12/26/2022 1021 Last data filed at 12/26/2022 0000 Gross per 24 hour  Intake 372.5 ml  Output 400 ml  Net -27.5 ml   Filed Weights   12/25/22 1033  Weight: 74.8 kg    Examination:  Physical Exam: GEN: NAD, alert and oriented x 3, chronically ill in appearance, appears older than stated age HEENT: NCAT, PERRL, EOMI, sclera clear, MMM PULM: CTAB w/o wheezes/crackles, normal respiratory effort, on room air CV: RRR w/o M/G/R GI: abd soft, NTND, NABS, no R/G/M GU: No appreciable lesions noted to penis MSK: no peripheral edema, flaccid paralysis left upper extremity, otherwise moves all extremities independently NEURO: CN II-XII intact, sensation to light touch intact, flaccid paralysis L UE, otherwise muscle strength globally intact PSYCH: normal mood/affect Integumentary: No rashes noted to abdomen, no concerning lesions rashes/wounds noted to exposed skin surfaces    Data Reviewed: I have personally reviewed following labs and imaging studies  CBC: Recent Labs  Lab 12/25/22 1237 12/25/22 1329  WBC  --  5.6  NEUTROABS  --  3.4  HGB 12.6* 12.2*  HCT 37.0* 37.0*  MCV  --  87.9  PLT  --  99991111   Basic Metabolic Panel: Recent Labs  Lab 12/25/22 1237 12/25/22 1329  NA 142 141  K 3.7 3.9  CL 105 107  CO2  --  22  GLUCOSE 110* 110*  BUN 20 19  CREATININE 1.10 0.96  CALCIUM  --  8.8*   GFR: Estimated  Creatinine Clearance: 106.1 mL/min (by C-G formula based on SCr of 0.96 mg/dL). Liver Function Tests: Recent Labs  Lab 12/25/22 1329  AST 31  ALT 22  ALKPHOS 50  BILITOT 0.5  PROT 6.6  ALBUMIN 2.8*   No results for input(s): "LIPASE", "AMYLASE" in the last 168 hours. No results for input(s): "AMMONIA" in the last 168 hours. Coagulation Profile: Recent Labs  Lab 12/25/22 1329  INR 1.1   Cardiac Enzymes: No results for input(s): "CKTOTAL", "CKMB", "CKMBINDEX", "TROPONINI" in the last 168 hours. BNP (last 3 results) No results for input(s): "PROBNP" in the last 8760 hours. HbA1C: No results for input(s): "HGBA1C" in the last 72 hours. CBG: No results for input(s): "GLUCAP" in the last 168 hours. Lipid Profile: No results for input(s): "CHOL", "HDL", "LDLCALC", "TRIG", "CHOLHDL", "LDLDIRECT" in the last 72 hours. Thyroid Function Tests: No results for input(s): "TSH", "T4TOTAL", "FREET4", "T3FREE", "THYROIDAB" in the last 72 hours. Anemia Panel: No results for input(s): "VITAMINB12", "FOLATE", "FERRITIN", "TIBC", "IRON", "RETICCTPCT" in the last 72 hours. Sepsis Labs: No results for input(s): "PROCALCITON", "LATICACIDVEN" in the last 168 hours.  No results found for this or any previous visit (from the past 240 hour(s)).  Radiology Studies: MR BRAIN WO CONTRAST  Result Date: 12/26/2022 CLINICAL DATA:  Presented on 02/29 with left arm weakness. EXAM: MRI HEAD WITHOUT CONTRAST TECHNIQUE: Multiplanar, multiecho pulse sequences of the brain and surrounding structures were obtained without intravenous contrast. COMPARISON:  CT/CTA head and neck 12/25/2022 FINDINGS: Incomplete study with axial and coronal DWI, motion degraded axial T2 and FLAIR, and motion degraded axial GRE sequences obtained. Brain: There is moderate-to-large area of diffusion restriction in the right MCA distribution involving the posterior temporal lobe, insula, external capsule, and corona radiata  consistent with acute infarct. There are also punctate acute infarcts in the high right parietal cortex. There is associated FLAIR signal abnormality and edema with sulcal effacement but no midline shift. There is no definite evidence of hemorrhage. Background parenchymal volume is normal. The ventricles are normal in size. Parenchymal signal is otherwise normal. Vascular: The major flow voids are normal on the provided sequences. Skull and upper cervical spine: Suboptimally evaluated on the provided sequences. Sinuses/Orbits: The paranasal sinuses are clear. The globes and orbits are grossly unremarkable. Other: None. IMPRESSION: Moderate-to-large area of acute infarct in the right MCA distribution with associated edema and regional sulcal effacement but no midline shift or definite hemorrhage. Electronically Signed   By: Valetta Mole M.D.   On: 12/26/2022 10:11   CT ANGIO HEAD NECK W WO CM  Result Date: 12/25/2022 CLINICAL DATA:  Initial evaluation for neuro deficit, stroke. EXAM: CT ANGIOGRAPHY HEAD AND NECK TECHNIQUE: Multidetector CT imaging of the head and neck was performed using the standard protocol during bolus administration of intravenous contrast. Multiplanar CT image reconstructions and MIPs were obtained to evaluate the vascular anatomy. Carotid stenosis measurements (when applicable) are obtained utilizing NASCET criteria, using the distal internal carotid diameter as the denominator. RADIATION DOSE REDUCTION: This exam was performed according to the departmental dose-optimization program which includes automated exposure control, adjustment of the mA and/or kV according to patient size and/or use of iterative reconstruction technique. CONTRAST:  49m OMNIPAQUE IOHEXOL 350 MG/ML SOLN COMPARISON:  Prior CT from earlier the same day. FINDINGS: CTA NECK FINDINGS Aortic arch: Visualized aortic arch normal in caliber. Aberrant right subclavian artery noted. No stenosis about the origin of the great  vessels. Right carotid system: Right common and internal carotid arteries are patent without dissection. Subtle transverse oriented Leigh shelf-like defect at the origin of the cervical right ICA, suspicious for a small carotid web (series 9, image 127). No significant stenosis. Left carotid system: Left common and internal carotid arteries are patent without stenosis or dissection. Vertebral arteries: Both vertebral arteries arise from subclavian arteries. No proximal subclavian artery stenosis. Left vertebral artery slightly dominant. Vertebral arteries are patent without stenosis or dissection. Skeleton: No discrete or worrisome osseous lesions. Mild cervical spondylosis without significant spinal stenosis. Other neck: No other acute soft tissue abnormality within the neck. Upper chest: Mild scattered ground-glass density with interlobular septal thickening within the visualized lungs, likely mild pulmonary interstitial congestion/edema. Visualized upper chest demonstrates no other acute finding. Review of the MIP images confirms the above findings CTA HEAD FINDINGS Anterior circulation: Both internal carotid arteries are patent to the termini without stenosis or other abnormality. A1 segments patent bilaterally. Normal anterior communicating artery complex. Anterior cerebral arteries patent without stenosis. No M1 stenosis or occlusion. Distal left MCA branches widely patent and well perfused. On the right, there is an acute proximal stump right M3 occlusion, inferior division (series 7, image 134), in keeping with the evolving right MCA territory  infarct. Remainder of the right MCA branches are patent and perfused. Posterior circulation: Minimal nonstenotic plaque noted within the dominant left V4 segment. Right V4 widely patent as well. Both PICA patent. Basilar widely patent without stenosis. Superior cerebellar arteries patent bilaterally. Both PCAs primarily supplied via the basilar. Short-segment mild  proximal left P2 stenosis noted. PCAs otherwise widely patent to their distal aspects. Venous sinuses: Patent allowing for timing the contrast bolus. Anatomic variants: None significant.  No aneurysm. Review of the MIP images confirms the above findings IMPRESSION: 1. Acute proximal stump right M3 occlusion, inferior division, in keeping with the evolving right MCA territory infarct. 2. Otherwise wide patency of the major arterial vasculature of the head and neck. No other emergent finding. 3. Subtle transversely oriented shelf-like defect at the origin of the cervical right ICA, suspicious for a small carotid web. No significant stenosis. 4. Aberrant right subclavian artery. 5. Mild scattered ground-glass density with interlobular septal thickening within the visualized lungs, likely mild pulmonary interstitial congestion/edema Electronically Signed   By: Jeannine Boga M.D.   On: 12/25/2022 20:59   CT HEAD WO CONTRAST  Result Date: 12/25/2022 CLINICAL DATA:  Neuro deficit, acute, stroke suspected EXAM: CT HEAD WITHOUT CONTRAST TECHNIQUE: Contiguous axial images were obtained from the base of the skull through the vertex without intravenous contrast. RADIATION DOSE REDUCTION: This exam was performed according to the departmental dose-optimization program which includes automated exposure control, adjustment of the mA and/or kV according to patient size and/or use of iterative reconstruction technique. COMPARISON:  None Available. FINDINGS: Brain: Abnormal edema and loss of gray differentiation involving the posterior right insula as well as overlying frontal and parietal lobes, compatible with posterior right MCA territory infarct. Regional mass effect with slight leftward midline shift. No evidence of mass lesion or hydrocephalus. No mass occupying acute hemorrhage. Vascular: Probable hyperdense vessel within a right M2 MCA branch. Skull: No acute fracture. Sinuses/Orbits: Largely clear sinuses.  No  acute orbital findings. Other: No mastoid effusions. IMPRESSION: 1. Acute or subacute right posterior MCA territory infarct. Regional mass effect with slight leftward midline shift. MRI could further characterize. 2. Probable hyperdense vessel within a right M2 MCA branch, suspicious for thrombus. A CTA could further evaluate. Electronically Signed   By: Margaretha Sheffield M.D.   On: 12/25/2022 13:03        Scheduled Meds:  aspirin EC  81 mg Oral Daily   clopidogrel  75 mg Oral Daily   enoxaparin (LOVENOX) injection  40 mg Subcutaneous Q24H   nicotine  14 mg Transdermal Once   nicotine  21 mg Transdermal Daily   Continuous Infusions:  sodium chloride 75 mL/hr at 12/26/22 0759     LOS: 0 days    Time spent: 51 minutes spent on chart review, discussion with nursing staff, consultants, updating family and interview/physical exam; more than 50% of that time was spent in counseling and/or coordination of care.    Romey Cohea J [place] DO [facility] Available via Epic secure chat 7am-7pm After these hours, please refer to coverage provider listed on [email or web address] 12/26/2022, 10:21 AM

## 2022-12-26 NOTE — Progress Notes (Signed)
SLP Cancellation Note  Patient Details Name: Jeremy Fritz MRN: [id number] DOB: 1980/11/24   Cancelled treatment:       Reason Eval/Treat Not Completed: Patient at procedure or test/unavailable    Osie Bond., M.A. [place] Office [phone]  Secure chat preferred  12/26/2022, 9:18 AM

## 2022-12-26 NOTE — Evaluation (Signed)
Occupational Therapy Evaluation Patient Details Name: Jeremy Fritz MRN: [id number] DOB: 01/21/1981 Today's Date: 12/26/2022   History of Present Illness Pt is a 42 y/o male presenting on 2/29 with 1 week of L facial droop and UE weakness.  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 MRI with moderate-to-large area of acute infarct in the right MCA distribution. PMH includes: polysubstance abuse.   Clinical Impression   PTA patient independent, not working or driving. Admitted for above and presents with problem list below.  He is impulsive, demonstrates poor awareness of safety and deficits, poor problem solving, and difficulty following multiple step commands. He has L UE weakness, L inattention, questionable upper L quadrant visual deficits, and impaired balance.  He requires +2 min assist for safety for bed mobility and functional mobility; he requires up to min assist +2 safety for ADLs.  Based on performance today, believe he will best benefit from continued OT services acutely and after dc at AIR level to optimize independence, safety and return to PLOF.      Recommendations for follow up therapy are one component of a multi-disciplinary discharge planning process, led by the attending physician.  Recommendations may be updated based on patient status, additional functional criteria and insurance authorization.   Follow Up Recommendations  Acute inpatient rehab (3hours/day)     Assistance Recommended at Discharge Frequent or constant Supervision/Assistance  Patient can return home with the following Two people to help with walking and/or transfers;A little help with bathing/dressing/bathroom;Assistance with cooking/housework;Direct supervision/assist for medications management;Direct supervision/assist for financial management;Assist for transportation;Help with stairs or ramp for entrance    Functional Status  Assessment  Patient has had a recent decline in their functional status and demonstrates the ability to make significant improvements in function in a reasonable and predictable amount of time.  Equipment Recommendations  Other (comment) (defer)    Recommendations for Other Services Rehab consult     Precautions / Restrictions Precautions Precautions: Fall Restrictions Weight Bearing Restrictions: No      Mobility Bed Mobility Overal bed mobility: Needs Assistance Bed Mobility: Supine to Sit     Supine to sit: Min assist     General bed mobility comments: impulsively transitioning to EOB and requires min assist to maintain balance upright at EOB    Transfers Overall transfer level: Needs assistance Equipment used: None Transfers: Sit to/from Stand Sit to Stand: Min assist, +2 safety/equipment           General transfer comment: refused use of RW, min assist to power up and steady at EOB.  Poor awareness to L side.      Balance Overall balance assessment: Needs assistance Sitting-balance support: No upper extremity supported, Feet supported Sitting balance-Leahy Scale: Fair     Standing balance support: No upper extremity supported, During functional activity Standing balance-Leahy Scale: Poor                             ADL either performed or assessed with clinical judgement   ADL Overall ADL's : Needs assistance/impaired     Grooming: Minimal assistance;Standing           Upper Body Dressing : Minimal assistance;Standing   Lower Body Dressing: Minimal assistance;Sit to/from stand   Toilet Transfer: Minimal assistance;Ambulation;+2 for safety/equipment   Toileting- Clothing Manipulation and Hygiene: Minimal assistance;Sit to/from stand       Functional  mobility during ADLs: Minimal assistance;+2 for safety/equipment;Cueing for safety       Vision Baseline Vision/History: 0 No visual deficits Vision Assessment?: Yes Eye  Alignment: Within Functional Limits Ocular Range of Motion: Within Functional Limits Tracking/Visual Pursuits: Other (comment) (able to track in all planes but loosing tracking on upper L side) Visual Fields:  (L superior vision deficits, consistently missing # of fingers in peripheral area of L upper quad) Additional Comments: further assessment required     Perception     Praxis      Pertinent Vitals/Pain Pain Assessment Pain Assessment: No/denies pain     Hand Dominance Right   Extremity/Trunk Assessment Upper Extremity Assessment Upper Extremity Assessment: LUE deficits/detail LUE Deficits / Details: grossly 3-/5 MMT, poor coordination, proprioception and sensation.  POOR awareness to UE. LUE Sensation: decreased light touch;decreased proprioception LUE Coordination: decreased fine motor;decreased gross motor   Lower Extremity Assessment Lower Extremity Assessment: Defer to PT evaluation       Communication Communication Communication: No difficulties   Cognition Arousal/Alertness: Awake/alert Behavior During Therapy: Flat affect, Impulsive Overall Cognitive Status: Impaired/Different from baseline Area of Impairment: Attention, Memory, Following commands, Safety/judgement, Awareness, Problem solving, Orientation                 Orientation Level: Disoriented to, Situation Current Attention Level: Sustained Memory: Decreased recall of precautions, Decreased short-term memory Following Commands: Follows one step commands consistently, Follows one step commands with increased time, Follows multi-step commands inconsistently Safety/Judgement: Decreased awareness of safety, Decreased awareness of deficits Awareness: Emergent Problem Solving: Slow processing, Decreased initiation, Difficulty sequencing, Requires verbal cues, Requires tactile cues General Comments: Patient unaware or unwilling to accept that he has had a stroke.  He presents with a flat affect, easily  frustrated.  Cognition limited to functional tasks to avoid agitation.  He follow simple commands with increased time, demonstrates POOR awareness to deficits and safety.     General Comments  mother at side and supportive    Exercises     Shoulder Instructions      Home Living Family/patient expects to be discharged to:: Private residence Living Arrangements: Non-relatives/Friends Available Help at Discharge: Friend(s) Type of Home: Mobile home Home Access: Stairs to enter Technical brewer of Steps: 2 Entrance Stairs-Rails: Can reach both Home Layout: One level     Bathroom Shower/Tub: Occupational psychologist: Standard     Home Equipment: Grab bars - toilet          Prior Functioning/Environment Prior Level of Function : Independent/Modified Independent             Mobility Comments: independent ADLs Comments: not working or driving, but manages IADLs        OT Problem List: Decreased strength;Decreased range of motion;Decreased activity tolerance;Impaired balance (sitting and/or standing);Impaired vision/perception;Decreased coordination;Decreased cognition;Decreased safety awareness;Decreased knowledge of use of DME or AE;Decreased knowledge of precautions;Impaired sensation;Impaired tone;Impaired UE functional use      OT Treatment/Interventions: Self-care/ADL training;Neuromuscular education;DME and/or AE instruction;Therapeutic activities;Cognitive remediation/compensation;Visual/perceptual remediation/compensation;Patient/family education;Balance training    OT Goals(Current goals can be found in the care plan section) Acute Rehab OT Goals Patient Stated Goal: none stated Time For Goal Achievement: 01/09/23 Potential to Achieve Goals: Fair  OT Frequency: Min 2X/week    Co-evaluation PT/OT/SLP Co-Evaluation/Treatment: Yes Reason for Co-Treatment: For patient/therapist safety;To address functional/ADL transfers   OT goals addressed  during session: ADL's and self-care      AM-PAC OT "6 Clicks" Daily Activity  Outcome Measure Help from another person eating meals?: A Little Help from another person taking care of personal grooming?: A Little Help from another person toileting, which includes using toliet, bedpan, or urinal?: A Little Help from another person bathing (including washing, rinsing, drying)?: A Little Help from another person to put on and taking off regular upper body clothing?: A Little Help from another person to put on and taking off regular lower body clothing?: A Little 6 Click Score: 18   End of Session Equipment Utilized During Treatment: Gait belt Nurse Communication: Mobility status;Other (comment) (safety)  Activity Tolerance: Patient tolerated treatment well Patient left: in chair;with call bell/phone within reach;with chair alarm set;with family/visitor present  OT Visit Diagnosis: Other abnormalities of gait and mobility (R26.89);Hemiplegia and hemiparesis;Other symptoms and signs involving cognitive function;History of falling (Z91.81)                Time: [id number] OT Time Calculation (min): 35 min Charges:  OT General Charges $OT Visit: 1 Visit OT Evaluation $OT Eval Moderate Complexity: 1 Mod  Jolaine Artist, OT Acute Rehabilitation Services Office [place] 12/26/2022, 12:22 PM

## 2022-12-26 NOTE — Progress Notes (Signed)
ID Pharmacy Note   Spoke with Dr. [place] regarding high RPR of 1:64. Patient had a previous RPR value of 1:32 at the health department in January with no record of treatment. Due to potential of neurosyphilis, will start IV Penicillin 24 million units as a continuous infusion.   Jimmy Footman, PharmD, BCPS, [place] [facility] Clinical Pharmacist Phone: [phone] 12/26/2022 2:49 PM

## 2022-12-26 NOTE — Progress Notes (Signed)
Inpatient Rehab Admissions Coordinator :  Per therapy recommendations, patient was screened for CIR candidacy by Malkia Nippert RN MSN.  At this time patient appears to be a potential candidate for CIR. I will place a rehab consult per protocol for full assessment. Please call me with any questions.  Draeden Kellman RN MSN Admissions Coordinator [phone]   

## 2022-12-26 NOTE — Progress Notes (Signed)
Echocardiogram 2D Echocardiogram has been performed.  Jeremy Fritz 12/26/2022, 3:35 PM
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
## 2022-12-26 NOTE — Progress Notes (Cosign Needed)
  Transition of Care [facility]) Screening Note   Patient Details  Name: Jeremy Fritz Date of Birth: [birth year]   Transition of Care [facility]) CM/SW Contact:    Pollie Friar, RN Phone Number: 12/26/2022, 4:03 PM   Pt is from home with friends. CIR to work up for rehab.  Transition of Care Department [facility]) has reviewed patient. We will continue to monitor patient advancement through interdisciplinary progression rounds. If new patient transition needs arise, please place a TOC consult.

## 2022-12-26 NOTE — Evaluation (Signed)
Physical Therapy Evaluation Patient Details Name: Jeremy Fritz MRN: [id number] DOB: [birth year] Today's Date: 12/26/2022  History of Present Illness  Pt is a 42 y/o male presenting on 2/29 with 1 week of L facial droop and UE weakness.  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 MRI with moderate-to-large area of acute infarct in the right MCA distribution. PMH includes: polysubstance abuse.   Clinical Impression  Pt presents with condition above and deficits mentioned below, see PT Problem List. PTA, he was independent without DME, living with roommates in a mobile home with 2 STE. Pt currently is impulsive with poor deficits and safety awareness along with L inattention. In addition, he displays deficits in sensation, proprioception, coordination, and strength in his L extremities along with balance deficits. He is requiring minA for bed mobility and transfers and up to modA to ambulate and prevent him from running into obstacles on his L with pt refusing to try to utilize a RW. As pt is young and has had a drastic functional decline, he would greatly benefit from intensive therapy in the AIR setting, hopefully to get to at least a supervision level. Will continue to follow acutely.       Recommendations for follow up therapy are one component of a multi-disciplinary discharge planning process, led by the attending physician.  Recommendations may be updated based on patient status, additional functional criteria and insurance authorization.  Follow Up Recommendations Acute inpatient rehab (3hours/day)      Assistance Recommended at Discharge Frequent or constant Supervision/Assistance  Patient can return home with the following  A lot of help with walking and/or transfers;A lot of help with bathing/dressing/bathroom;Assistance with cooking/housework;Direct supervision/assist for medications management;Direct  supervision/assist for financial management;Help with stairs or ramp for entrance;Assist for transportation    Equipment Recommendations Rolling walker (2 wheels);BSC/3in1 (unsure if pt will use them though)  Recommendations for Other Services  Rehab consult    Functional Status Assessment Patient has had a recent decline in their functional status and demonstrates the ability to make significant improvements in function in a reasonable and predictable amount of time.     Precautions / Restrictions Precautions Precautions: Fall Precaution Comments: L inattention Restrictions Weight Bearing Restrictions: No      Mobility  Bed Mobility Overal bed mobility: Needs Assistance Bed Mobility: Supine to Sit     Supine to sit: Min assist, HOB elevated     General bed mobility comments: impulsively transitioning to EOB and requires min assist to maintain balance upright at EOB    Transfers Overall transfer level: Needs assistance Equipment used: None Transfers: Sit to/from Stand Sit to Stand: Min assist, +2 safety/equipment           General transfer comment: refused use of RW, min assist to power up and steady at EOB.  Poor awareness to L side.    Ambulation/Gait Ambulation/Gait assistance: Mod assist, +2 safety/equipment Gait Distance (Feet): 120 Feet Assistive device: None Gait Pattern/deviations: Step-through pattern, Decreased step length - left, Decreased dorsiflexion - left, Knee flexed in stance - left, Drifts right/left Gait velocity: reduced Gait velocity interpretation: >2.62 ft/sec, indicative of community ambulatory   General Gait Details: Pt with very long strides, but unsteady with poor awareness of him dragging his L leg, often dragging it along with him. Needs repeated cues to slow down and attend to his L extremities to improve his stability and safety, but poor carryover. Noted L  knee instability but no buckling. Pt drifts to the L and almost ran straight  into the L side of the doorframe, needing physical assistance to avoid it. When informed of this, pt reluctant to accept it was due to his deficits, stating it was due to him "being pulled" by therapist. +2 for safety due to impulsivity and difficulty attending to L and following cues.  Stairs            Wheelchair Mobility    Modified Rankin (Stroke Patients Only) Modified Rankin (Stroke Patients Only) Pre-Morbid Rankin Score: No symptoms Modified Rankin: Moderately severe disability     Balance Overall balance assessment: Needs assistance Sitting-balance support: No upper extremity supported, Feet supported Sitting balance-Leahy Scale: Fair Sitting balance - Comments: Poor trunk coordination, impulsive quick movements with poor control at EOB, minA to prevent LOB intermittently.   Standing balance support: No upper extremity supported, During functional activity Standing balance-Leahy Scale: Poor Standing balance comment: Needs up to modA to prevent LOB and injury                             Pertinent Vitals/Pain Pain Assessment Pain Assessment: No/denies pain    Home Living Family/patient expects to be discharged to:: Private residence Living Arrangements: Non-relatives/Friends Available Help at Discharge: Friend(s) Type of Home: Mobile home Home Access: Stairs to enter Entrance Stairs-Rails: Can reach both Entrance Stairs-Number of Steps: 2   Home Layout: One level Home Equipment: Grab bars - toilet      Prior Function Prior Level of Function : Independent/Modified Independent             Mobility Comments: independent ADLs Comments: not working or driving, but manages IADLs     Hand Dominance   Dominant Hand: Right    Extremity/Trunk Assessment   Upper Extremity Assessment Upper Extremity Assessment: Defer to OT evaluation LUE Deficits / Details: grossly 3-/5 MMT, poor coordination, proprioception and sensation.  POOR awareness to  UE. LUE Sensation: decreased light touch;decreased proprioception LUE Coordination: decreased fine motor;decreased gross motor    Lower Extremity Assessment Lower Extremity Assessment: LLE deficits/detail LLE Deficits / Details: MMT scores of 4- hip flexion, 4+ knee extension, 4 ankle dorsiflexion; denies numbness/tingling, but based on functional mobility he appears to have some sensation deficits; proprioception impaired; incoordination noted; poor awareness to L leg LLE Sensation: decreased proprioception;decreased light touch LLE Coordination: decreased fine motor;decreased gross motor       Communication   Communication: No difficulties  Cognition Arousal/Alertness: Awake/alert Behavior During Therapy: Flat affect, Impulsive Overall Cognitive Status: Impaired/Different from baseline Area of Impairment: Attention, Memory, Following commands, Safety/judgement, Awareness, Problem solving, Orientation                 Orientation Level: Disoriented to, Situation Current Attention Level: Sustained Memory: Decreased recall of precautions, Decreased short-term memory Following Commands: Follows one step commands consistently, Follows one step commands with increased time, Follows multi-step commands inconsistently Safety/Judgement: Decreased awareness of safety, Decreased awareness of deficits Awareness: Emergent Problem Solving: Slow processing, Decreased initiation, Difficulty sequencing, Requires verbal cues, Requires tactile cues General Comments: Patient unaware or unwilling to accept that he has had a stroke.  He presents with a flat affect, easily frustrated.  Cognition limited to functional tasks to avoid agitation.  He follow simple commands with increased time, demonstrates POOR awareness to deficits and safety. Pt with L inattention, resulting in him almost running straight into the doorframe of  his room, needing assistance to stop him and safely direct him through the  doorframe and pt stating it was because PT was "pulling him" that he almost hit the doorframe, unwilling to accept info in regards to his deficits provided by PT.        General Comments General comments (skin integrity, edema, etc.): mother at side and supportive    Exercises     Assessment/Plan    PT Assessment Patient needs continued PT services  PT Problem List Decreased strength;Decreased activity tolerance;Decreased balance;Decreased mobility;Decreased coordination;Decreased cognition;Decreased safety awareness;Impaired sensation       PT Treatment Interventions DME instruction;Gait training;Stair training;Functional mobility training;Therapeutic activities;Therapeutic exercise;Balance training;Neuromuscular re-education;Cognitive remediation;Patient/family education    PT Goals (Current goals can be found in the Care Plan section)  Acute Rehab PT Goals Patient Stated Goal: pt did not state; mother hoping for improvement PT Goal Formulation: With patient/family Time For Goal Achievement: 01/09/23 Potential to Achieve Goals: Good    Frequency Min 4X/week     Co-evaluation PT/OT/SLP Co-Evaluation/Treatment: Yes Reason for Co-Treatment: For patient/therapist safety;To address functional/ADL transfers;Necessary to address cognition/behavior during functional activity PT goals addressed during session: Mobility/safety with mobility;Balance OT goals addressed during session: ADL's and self-care       AM-PAC PT "6 Clicks" Mobility  Outcome Measure Help needed turning from your back to your side while in a flat bed without using bedrails?: A Little Help needed moving from lying on your back to sitting on the side of a flat bed without using bedrails?: A Little Help needed moving to and from a bed to a chair (including a wheelchair)?: A Little Help needed standing up from a chair using your arms (e.g., wheelchair or bedside chair)?: A Little Help needed to walk in hospital  room?: A Lot Help needed climbing 3-5 steps with a railing? : Total 6 Click Score: 15    End of Session Equipment Utilized During Treatment: Gait belt Activity Tolerance: Patient tolerated treatment well Patient left: in chair;with call bell/phone within reach;with chair alarm set;with family/visitor present Nurse Communication: Mobility status PT Visit Diagnosis: Unsteadiness on feet (R26.81);Other abnormalities of gait and mobility (R26.89);Muscle weakness (generalized) (M62.81);Difficulty in walking, not elsewhere classified (R26.2);Other symptoms and signs involving the nervous system (R29.898);Hemiplegia and hemiparesis Hemiplegia - Right/Left: Left Hemiplegia - dominant/non-dominant: Non-dominant Hemiplegia - caused by: Cerebral infarction    Time: 1039-1110 PT Time Calculation (min) (ACUTE ONLY): 31 min   Charges:   PT Evaluation $PT Eval Moderate Complexity: 1 Mod          Moishe Spice, PT, DPT Acute Rehabilitation Services  Office: [phone]   Orvan Falconer 12/26/2022, 1:13 PM

## 2022-12-27 DIAGNOSIS — I63511 Cerebral infarction due to unspecified occlusion or stenosis of right middle cerebral artery: Secondary | ICD-10-CM

## 2022-12-27 LAB — HEMOGLOBIN A1C
Hgb A1c MFr Bld: 5.9 % — ABNORMAL HIGH (ref 4.8–5.6)
Mean Plasma Glucose: 123 mg/dL

## 2022-12-27 MED ORDER — ATORVASTATIN CALCIUM 40 MG PO TABS
40.0000 mg | ORAL_TABLET | Freq: Every day | ORAL | Status: DC
  Administered 2022-12-27 – 2022-12-31 (×5): 40 mg via ORAL
  Filled 2022-12-27 (×5): qty 1

## 2022-12-27 NOTE — Progress Notes (Signed)
Inpatient Rehab Admissions Coordinator:    I spoke with pt. Regarding potential CIR admit. He states that he is interested and that his mother can provide 24/7 support ( I will reach out to her to confirm). He is in agreement to participate in intensive rehab program for approximately 14-16 days with the plan to d/c home with his mother at supervision levels. I will open case with his insurance on Monday as they are not open over the weekend.   Clemens Catholic, [place], [place] Admissions Coordinator  [phone] ([place]) [phone] (office)

## 2022-12-27 NOTE — Progress Notes (Signed)
PROGRESS NOTE    Jeremy Fritz  [id number] DOB: [birth year] DOA: 12/25/2022 PCP: Patient, No Pcp Per    Brief Narrative:   Jeremy Fritz is a 42 y.o. male with past medical history significant for essential hypertension, polysubstance abuse with tobacco/THC/cocaine who presented to [facility] ED on 2/29 with left arm weakness.  Patient reports onset roughly 1 week ago, he reports awoke from sleep and went to the fridge rater and tried to get a drink out and dropped it to the floor.  Patient did not seek care until his mother noticed his symptoms.  Patient endorses continued tobacco abuse.  He also reports that he has a history of high cholesterol currently not treated.  Also family history of diabetes, heart disease, hyperlipidemia.  Patient denies headache, no dizziness, no chest pain, no palpitations, no shortness of breath, no abdominal pain, no fever/chills/night sweats, no nausea/vomiting/diarrhea, no paresthesias.   In the ED, temperature 98.6 F, HR 99, RR 18, BP 145/97, SpO2 100% room air.  Sodium 141, potassium 3.9, chloride 107, CO2 22, glucose 110, BUN 19, creatinine 0.96.  AST 31, ALT 22, total bilirubin 0.5.  WBC 5.6, hemoglobin 12.2, platelet count 267.  INR 1.1.  EtOH level less than 10.  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  Neurology was consulted.  TRH consulted for admission for further evaluation management of acute versus subacute CVA.  Assessment & Plan:   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  MR brain with findings of moderate to  large area of acute infarct right MCA distribution with associated edema and regional sulcal effacement but no midline shift or definite hemorrhage.  TTE with LVEF 25-30%, no LV thrombus identified, no intra-atrial shunt.  Hemoglobin A1c 5.9, LDL 109. -- Neurology following, appreciate assistance --DAPT with aspirin 81 mg p.o. daily, Plavix 75 mg p.o. daily x 21 days followed by aspirin alone -- Atorvastatin 40 mg p.o. daily -- Monitor on telemetry -- Continue PT/OT efforts while inpatient, pending CIR evaluation  Cardiomyopathy TTE with LVEF 25-30%, LV severely decreased function, LV with global hypokinesis, LV moderately dilated, severe LVH, no LV thrombus visualized, RV systolic function mildly reduced, biatrial enlargement, small pericardial effusion which is circumferential, moderate/severe MR, no aortic stenosis, IVC dilated.  History of chronic cocaine/tobacco use.  Denies chest pain. --Cardiology following, appreciate assistance --Carvedilol 3.125 mg p.o. twice daily --Losartan 12.5 mg p.o. daily --On aspirin/statin/Plavix   Essential hypertension Patient reports currently on losartan and amlodipine -- BP 124/85 this a.m., will continue to hold home losartan and amlodipine for now given current BP well-controlled off of antihypertensives  Polysubstance abuse with history of tobacco, cocaine, THC UDS positive for cocaine, THC.  Counseled on need for complete abstinence/cessation. 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  Discussed with ID, Dr. Juleen China.  Recommend LP, but patient refused. -- T palladium antibodies: Pending -- Penicillin 12,000,000 units IV every 12 hours   DVT prophylaxis: enoxaparin (LOVENOX) injection 40 mg Start: 12/25/22 2200 SCD's Start: 12/25/22 1814    Code Status: Full Code Family Communication:  Disposition Plan:  Level of care: Telemetry Medical Status is: Inpatient Remains inpatient appropriate  because: IV antibiotics, pending CIR evaluation   Consultants:  Neurology  Procedures:  TTE Antimicrobials:  IV penicillin 3/1>>   Subjective: Patient seen examined at bedside.  Lying in bed.  Her friend present.  No specific complaints this morning.  Continues with flaccid paralysis of left upper extremity.  Overnight RN concerned given he pulled out his IV 3 times, high fall risk and does not comply with safety regulations.  Discussed with a.m. RN this morning.  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  No other specific complaints this morning.  Denies headache, no chest pain, no shortness of breath, no abdominal pain.  No other acute concerns overnight per nursing staff.  Objective: Vitals:   12/26/22 2004 12/26/22 2346 12/27/22 0440 12/27/22 0811  BP: (!) 143/101 (!) 140/92 138/88 (!) 149/114  Pulse: (!) 109 87 94 (!) 52  Resp: '16 19 14 17  '$ Temp: 99.1 F (37.3 C) 98.7 F (37.1 C) 98.9 F (37.2 C) 98.9 F (37.2 C)  TempSrc: Oral Oral Oral Oral  SpO2: 97% 95% 98% 97%  Weight:      Height:        Intake/Output Summary (Last 24 hours) at 12/27/2022 [id number] Last data filed at 12/27/2022 0545 Gross per 24 hour  Intake 976.68 ml  Output --  Net 976.68 ml   Filed Weights   12/25/22 1033  Weight: 74.8 kg    Examination:  Physical Exam: GEN: NAD, alert and oriented x 3, chronically ill in appearance, appears older than stated age HEENT: NCAT, PERRL, EOMI, sclera clear, MMM PULM: CTAB w/o wheezes/crackles, normal respiratory effort, on room air CV: RRR w/o M/G/R GI: abd soft, NTND, NABS, no R/G/M GU: No appreciable lesions noted to penis MSK: no peripheral edema, flaccid paralysis left upper extremity, otherwise moves all extremities independently NEURO: CN II-XII intact, sensation to light touch intact, flaccid paralysis L UE, otherwise muscle strength globally intact PSYCH: Depressed mood, flat affect Integumentary: No rashes noted to abdomen, no other  concerning lesions rashes/wounds noted to exposed skin surfaces    Data Reviewed: I have personally reviewed following labs and imaging studies  CBC: Recent Labs  Lab 12/25/22 1237 12/25/22 1329  WBC  --  5.6  NEUTROABS  --  3.4  HGB 12.6* 12.2*  HCT 37.0* 37.0*  MCV  --  87.9  PLT  --  99991111   Basic Metabolic Panel: Recent Labs  Lab 12/25/22 1237 12/25/22 1329  NA 142 141  K 3.7 3.9  CL 105 107  CO2  --  22  GLUCOSE 110* 110*  BUN 20 19  CREATININE 1.10 0.96  CALCIUM  --  8.8*   GFR: Estimated Creatinine Clearance: 106.1 mL/min (by C-G formula based on SCr of 0.96 mg/dL). Liver Function Tests: Recent Labs  Lab 12/25/22 1329  AST 31  ALT 22  ALKPHOS 50  BILITOT 0.5  PROT 6.6  ALBUMIN 2.8*   No results for input(s): "LIPASE", "AMYLASE" in the last 168 hours. No results for input(s): "AMMONIA" in the last 168 hours. Coagulation Profile: Recent Labs  Lab 12/25/22 1329  INR 1.1   Cardiac Enzymes: No results for input(s): "CKTOTAL", "CKMB", "CKMBINDEX", "TROPONINI" in the last 168 hours. BNP (last 3 results) No results for input(s): "PROBNP" in the last 8760 hours. HbA1C: Recent Labs    12/26/22 1217  HGBA1C 5.9*   CBG: No results for input(s): "GLUCAP" in  the last 168 hours. Lipid Profile: Recent Labs    12/26/22 1217  CHOL 164  HDL 39*  LDLCALC 109*  TRIG 82  CHOLHDL 4.2   Thyroid Function Tests: No results for input(s): "TSH", "T4TOTAL", "FREET4", "T3FREE", "THYROIDAB" in the last 72 hours. Anemia Panel: No results for input(s): "VITAMINB12", "FOLATE", "FERRITIN", "TIBC", "IRON", "RETICCTPCT" in the last 72 hours. Sepsis Labs: No results for input(s): "PROCALCITON", "LATICACIDVEN" in the last 168 hours.  No results found for this or any previous visit (from the past 240 hour(s)).       Radiology Studies: ECHOCARDIOGRAM COMPLETE  Result Date: 12/26/2022    ECHOCARDIOGRAM REPORT   Patient Name:   ELMA WYDRA Date of Exam:  12/26/2022 Medical Rec #:  [id number]       Height:       72.0 in Accession #:    [id number]      Weight:       164.9 lb Date of Birth:  1981/03/18      BSA:          1.963 m Patient Age:    10 years        BP:           124/85 mmHg Patient Gender: M               HR:           100 bpm. Exam Location:  Inpatient Procedure: 2D Echo, Cardiac Doppler and Color Doppler Indications:    Stroke I63.9  History:        Patient has no prior history of Echocardiogram examinations.                 Stroke; Risk Factors:Hypertension and Current Smoker.  Sonographer:    Ronny Flurry Referring Phys: [id number] Ervine Witucki J [place] IMPRESSIONS  1. Left ventricular ejection fraction, by estimation, is 25 to 30%. The left ventricle has severely decreased function. The left ventricle demonstrates global hypokinesis. The left ventricular internal cavity size was moderately dilated. There is severe  left ventricular hypertrophy of the inferior segment. Left ventricular diastolic function could not be evaluated. There is no LV thrombus visualized.  2. Right ventricular systolic function mildly reduced with normal basal excursion. The right ventricular size is mild to moderately. Tricuspid regurgitation signal is inadequate for assessing PA pressure.  3. Left atrial size was mildly dilated.  4. Right atrial size was mildly dilated.  5. A small pericardial effusion is present. The pericardial effusion is circumferential.  6. MR mechanism appears ventricular functional in nature, with blunting of the right sided pulmonary vein Doppler. The mitral valve is normal in structure. Moderate to severe mitral valve regurgitation.  7. The aortic valve is tricuspid. Aortic valve regurgitation is not visualized. No aortic stenosis is present.  8. The inferior vena cava is dilated in size with <50% respiratory variability, suggesting right atrial pressure of 15 mmHg. FINDINGS  Left Ventricle: Left ventricular ejection fraction, by estimation, is 25 to 30%. The  left ventricle has severely decreased function. The left ventricle demonstrates global hypokinesis. The left ventricular internal cavity size was moderately dilated. There is severe left ventricular hypertrophy of the inferior segment. Left ventricular diastolic function could not be evaluated due to mitral regurgitation (moderate or greater). Left ventricular diastolic function could not be evaluated. Right Ventricle: The right ventricular size is mild to moderately. No increase in right ventricular wall thickness. Right ventricular systolic function mildly reduced with normal basal excursion. Tricuspid  regurgitation signal is inadequate for assessing  PA pressure. Left Atrium: Left atrial size was mildly dilated. Right Atrium: Right atrial size was mildly dilated. Pericardium: A small pericardial effusion is present. The pericardial effusion is circumferential. Mitral Valve: MR mechanism appears ventricular functional in nature, with blunting of the right sided pulmonary vein Doppler. The mitral valve is normal in structure. Moderate to severe mitral valve regurgitation. Tricuspid Valve: The tricuspid valve is normal in structure. Tricuspid valve regurgitation is not demonstrated. No evidence of tricuspid stenosis. Aortic Valve: The aortic valve is tricuspid. Aortic valve regurgitation is not visualized. No aortic stenosis is present. Aortic valve mean gradient measures 4.0 mmHg. Aortic valve peak gradient measures 7.1 mmHg. Aortic valve area, by VTI measures 2.40 cm. Pulmonic Valve: The pulmonic valve was normal in structure. Pulmonic valve regurgitation is not visualized. No evidence of pulmonic stenosis. Aorta: The aortic root and ascending aorta are structurally normal, with no evidence of dilitation. Venous: The inferior vena cava is dilated in size with less than 50% respiratory variability, suggesting right atrial pressure of 15 mmHg. IAS/Shunts: No atrial level shunt detected by color flow Doppler.  LEFT  VENTRICLE PLAX 2D LVIDd:         6.10 cm   Diastology LVIDs:         5.40 cm   LV e' medial:    6.20 cm/s LV PW:         1.50 cm   LV E/e' medial:  22.7 LV IVS:        1.10 cm   LV e' lateral:   10.10 cm/s LVOT diam:     2.20 cm   LV E/e' lateral: 14.0 LV SV:         54 LV SV Index:   27 LVOT Area:     3.80 cm  RIGHT VENTRICLE             IVC RV S prime:     15.30 cm/s  IVC diam: 2.80 cm TAPSE (M-mode): 1.6 cm LEFT ATRIUM             Index        RIGHT ATRIUM           Index LA diam:        4.30 cm 2.19 cm/m   RA Area:     20.20 cm LA Vol (A2C):   68.4 ml 34.85 ml/m  RA Volume:   62.30 ml  31.74 ml/m LA Vol (A4C):   73.3 ml 37.34 ml/m LA Biplane Vol: 70.8 ml 36.07 ml/m  AORTIC VALVE AV Area (Vmax):    2.88 cm AV Area (Vmean):   2.62 cm AV Area (VTI):     2.40 cm AV Vmax:           133.00 cm/s AV Vmean:          95.300 cm/s AV VTI:            0.223 m AV Peak Grad:      7.1 mmHg AV Mean Grad:      4.0 mmHg LVOT Vmax:         100.60 cm/s LVOT Vmean:        65.600 cm/s LVOT VTI:          0.141 m LVOT/AV VTI ratio: 0.63  AORTA Ao Root diam: 3.70 cm Ao Asc diam:  3.05 cm MV E velocity: 141.00 cm/s  SHUNTS                             Systemic VTI:  0.14 m                             Systemic Diam: 2.20 cm Rudean Haskell MD Electronically signed by Rudean Haskell MD Signature Date/Time: 12/26/2022/4:27:48 PM    Final    MR BRAIN WO CONTRAST  Result Date: 12/26/2022 CLINICAL DATA:  Presented on 02/29 with left arm weakness. EXAM: MRI HEAD WITHOUT CONTRAST TECHNIQUE: Multiplanar, multiecho pulse sequences of the brain and surrounding structures were obtained without intravenous contrast. COMPARISON:  CT/CTA head and neck 12/25/2022 FINDINGS: Incomplete study with axial and coronal DWI, motion degraded axial T2 and FLAIR, and motion degraded axial GRE sequences obtained. Brain: There is moderate-to-large area of diffusion restriction in the right MCA distribution involving the  posterior temporal lobe, insula, external capsule, and corona radiata consistent with acute infarct. There are also punctate acute infarcts in the high right parietal cortex. There is associated FLAIR signal abnormality and edema with sulcal effacement but no midline shift. There is no definite evidence of hemorrhage. Background parenchymal volume is normal. The ventricles are normal in size. Parenchymal signal is otherwise normal. Vascular: The major flow voids are normal on the provided sequences. Skull and upper cervical spine: Suboptimally evaluated on the provided sequences. Sinuses/Orbits: The paranasal sinuses are clear. The globes and orbits are grossly unremarkable. Other: None. IMPRESSION: Moderate-to-large area of acute infarct in the right MCA distribution with associated edema and regional sulcal effacement but no midline shift or definite hemorrhage. Electronically Signed   By: Valetta Mole M.D.   On: 12/26/2022 10:11   CT ANGIO HEAD NECK W WO CM  Result Date: 12/25/2022 CLINICAL DATA:  Initial evaluation for neuro deficit, stroke. EXAM: CT ANGIOGRAPHY HEAD AND NECK TECHNIQUE: Multidetector CT imaging of the head and neck was performed using the standard protocol during bolus administration of intravenous contrast. Multiplanar CT image reconstructions and MIPs were obtained to evaluate the vascular anatomy. Carotid stenosis measurements (when applicable) are obtained utilizing NASCET criteria, using the distal internal carotid diameter as the denominator. RADIATION DOSE REDUCTION: This exam was performed according to the departmental dose-optimization program which includes automated exposure control, adjustment of the mA and/or kV according to patient size and/or use of iterative reconstruction technique. CONTRAST:  56m OMNIPAQUE IOHEXOL 350 MG/ML SOLN COMPARISON:  Prior CT from earlier the same day. FINDINGS: CTA NECK FINDINGS Aortic arch: Visualized aortic arch normal in caliber. Aberrant right  subclavian artery noted. No stenosis about the origin of the great vessels. Right carotid system: Right common and internal carotid arteries are patent without dissection. Subtle transverse oriented Leigh shelf-like defect at the origin of the cervical right ICA, suspicious for a small carotid web (series 9, image 127). No significant stenosis. Left carotid system: Left common and internal carotid arteries are patent without stenosis or dissection. Vertebral arteries: Both vertebral arteries arise from subclavian arteries. No proximal subclavian artery stenosis. Left vertebral artery slightly dominant. Vertebral arteries are patent without stenosis or dissection. Skeleton: No discrete or worrisome osseous lesions. Mild cervical spondylosis without significant spinal stenosis. Other neck: No other acute soft tissue abnormality within the neck. Upper chest: Mild scattered ground-glass density with interlobular septal thickening within the visualized lungs, likely mild pulmonary interstitial congestion/edema. Visualized upper chest demonstrates no other acute  finding. Review of the MIP images confirms the above findings CTA HEAD FINDINGS Anterior circulation: Both internal carotid arteries are patent to the termini without stenosis or other abnormality. A1 segments patent bilaterally. Normal anterior communicating artery complex. Anterior cerebral arteries patent without stenosis. No M1 stenosis or occlusion. Distal left MCA branches widely patent and well perfused. On the right, there is an acute proximal stump right M3 occlusion, inferior division (series 7, image 134), in keeping with the evolving right MCA territory infarct. Remainder of the right MCA branches are patent and perfused. Posterior circulation: Minimal nonstenotic plaque noted within the dominant left V4 segment. Right V4 widely patent as well. Both PICA patent. Basilar widely patent without stenosis. Superior cerebellar arteries patent bilaterally.  Both PCAs primarily supplied via the basilar. Short-segment mild proximal left P2 stenosis noted. PCAs otherwise widely patent to their distal aspects. Venous sinuses: Patent allowing for timing the contrast bolus. Anatomic variants: None significant.  No aneurysm. Review of the MIP images confirms the above findings IMPRESSION: 1. Acute proximal stump right M3 occlusion, inferior division, in keeping with the evolving right MCA territory infarct. 2. Otherwise wide patency of the major arterial vasculature of the head and neck. No other emergent finding. 3. Subtle transversely oriented shelf-like defect at the origin of the cervical right ICA, suspicious for a small carotid web. No significant stenosis. 4. Aberrant right subclavian artery. 5. Mild scattered ground-glass density with interlobular septal thickening within the visualized lungs, likely mild pulmonary interstitial congestion/edema Electronically Signed   By: Jeannine Boga M.D.   On: 12/25/2022 20:59   CT HEAD WO CONTRAST  Result Date: 12/25/2022 CLINICAL DATA:  Neuro deficit, acute, stroke suspected EXAM: CT HEAD WITHOUT CONTRAST TECHNIQUE: Contiguous axial images were obtained from the base of the skull through the vertex without intravenous contrast. RADIATION DOSE REDUCTION: This exam was performed according to the departmental dose-optimization program which includes automated exposure control, adjustment of the mA and/or kV according to patient size and/or use of iterative reconstruction technique. COMPARISON:  None Available. FINDINGS: Brain: Abnormal edema and loss of gray differentiation involving the posterior right insula as well as overlying frontal and parietal lobes, compatible with posterior right MCA territory infarct. Regional mass effect with slight leftward midline shift. No evidence of mass lesion or hydrocephalus. No mass occupying acute hemorrhage. Vascular: Probable hyperdense vessel within a right M2 MCA branch. Skull:  No acute fracture. Sinuses/Orbits: Largely clear sinuses.  No acute orbital findings. Other: No mastoid effusions. IMPRESSION: 1. Acute or subacute right posterior MCA territory infarct. Regional mass effect with slight leftward midline shift. MRI could further characterize. 2. Probable hyperdense vessel within a right M2 MCA branch, suspicious for thrombus. A CTA could further evaluate. Electronically Signed   By: Margaretha Sheffield M.D.   On: 12/25/2022 13:03        Scheduled Meds:  aspirin EC  81 mg Oral Daily   atorvastatin  40 mg Oral Daily   carvedilol  3.125 mg Oral BID WC   clopidogrel  75 mg Oral Daily   enoxaparin (LOVENOX) injection  40 mg Subcutaneous Q24H   losartan  12.5 mg Oral Daily   nicotine  21 mg Transdermal Daily   Continuous Infusions:  penicillin G potassium 12 Million Units in dextrose 5 % 500 mL CONTINUOUS infusion 41.7 mL/hr at 12/27/22 0922     LOS: 1 day    Time spent: 51 minutes spent on chart review, discussion with nursing staff, consultants, updating family and interview/physical  exam; more than 50% of that time was spent in counseling and/or coordination of care.    Atharva Mirsky J [place] DO [facility] Available via Epic secure chat 7am-7pm After these hours, please refer to coverage provider listed on [email or web address] 12/27/2022, 9:27 AM

## 2022-12-27 NOTE — Progress Notes (Addendum)
STROKE TEAM PROGRESS NOTE   INTERVAL HISTORY RN at bedside.  Girlfriend at bedside, but sleeping.  RN reports patient is impulsive and continues to get up without help.  Further educated patient on need to wait for assistance, use call bell to avoid fall.  On exam patient alert and oriented, left left-sided weakness continues with decreased sensation on the left  Educated patient on plan of care, including further testing needed and need for him to go to rehab after discharge, patient agreeable.   Vitals:   12/26/22 0800 12/26/22 2004 12/26/22 2346 12/27/22 0440  BP: (!) 135/101 (!) 143/101 (!) 140/92 138/88  Pulse: 96 (!) 109 87 94  Resp: '16 16 19 14  '$ Temp: 98.2 F (36.8 C) 99.1 F (37.3 C) 98.7 F (37.1 C) 98.9 F (37.2 C)  TempSrc: Oral Oral Oral Oral  SpO2: 97% 97% 95% 98%  Weight:      Height:       CBC:  Recent Labs  Lab 12/25/22 1237 12/25/22 1329  WBC  --  5.6  NEUTROABS  --  3.4  HGB 12.6* 12.2*  HCT 37.0* 37.0*  MCV  --  87.9  PLT  --  99991111    Basic Metabolic Panel:  Recent Labs  Lab 12/25/22 1237 12/25/22 1329  NA 142 141  K 3.7 3.9  CL 105 107  CO2  --  22  GLUCOSE 110* 110*  BUN 20 19  CREATININE 1.10 0.96  CALCIUM  --  8.8*    Lipid Panel:  Recent Labs  Lab 12/26/22 1217  CHOL 164  TRIG 82  HDL 39*  CHOLHDL 4.2  VLDL 16  LDLCALC 109*   HgbA1c:  Recent Labs  Lab 12/26/22 1217  HGBA1C 5.9*   Urine Drug Screen:  Recent Labs  Lab 12/25/22 2014  LABOPIA NONE DETECTED  COCAINSCRNUR POSITIVE*  LABBENZ NONE DETECTED  AMPHETMU NONE DETECTED  THCU POSITIVE*  LABBARB NONE DETECTED     Alcohol Level  Recent Labs  Lab 12/25/22 1328  ETH <10     IMAGING past 24 hours ECHOCARDIOGRAM COMPLETE  Result Date: 12/26/2022    ECHOCARDIOGRAM REPORT   Patient Name:   Jeremy Fritz Date of Exam: 12/26/2022 Medical Rec #:  [id number]       Height:       72.0 in Accession #:    [id number]      Weight:       164.9 lb Date of Birth:   1980-12-02      BSA:          1.963 m Patient Age:    42 years        BP:           124/85 mmHg Patient Gender: M               HR:           100 bpm. Exam Location:  Inpatient Procedure: 2D Echo, Cardiac Doppler and Color Doppler Indications:    Stroke I63.9  History:        Patient has no prior history of Echocardiogram examinations.                 Stroke; Risk Factors:Hypertension and Current Smoker.  Sonographer:    Ronny Flurry Referring Phys: [id number] ERIC J [place] IMPRESSIONS  1. Left ventricular ejection fraction, by estimation, is 25 to 30%. The left ventricle has severely decreased function. The left ventricle demonstrates global hypokinesis. The  left ventricular internal cavity size was moderately dilated. There is severe  left ventricular hypertrophy of the inferior segment. Left ventricular diastolic function could not be evaluated. There is no LV thrombus visualized.  2. Right ventricular systolic function mildly reduced with normal basal excursion. The right ventricular size is mild to moderately. Tricuspid regurgitation signal is inadequate for assessing PA pressure.  3. Left atrial size was mildly dilated.  4. Right atrial size was mildly dilated.  5. A small pericardial effusion is present. The pericardial effusion is circumferential.  6. MR mechanism appears ventricular functional in nature, with blunting of the right sided pulmonary vein Doppler. The mitral valve is normal in structure. Moderate to severe mitral valve regurgitation.  7. The aortic valve is tricuspid. Aortic valve regurgitation is not visualized. No aortic stenosis is present.  8. The inferior vena cava is dilated in size with <50% respiratory variability, suggesting right atrial pressure of 15 mmHg. FINDINGS  Left Ventricle: Left ventricular ejection fraction, by estimation, is 25 to 30%. The left ventricle has severely decreased function. The left ventricle demonstrates global hypokinesis. The left ventricular internal  cavity size was moderately dilated. There is severe left ventricular hypertrophy of the inferior segment. Left ventricular diastolic function could not be evaluated due to mitral regurgitation (moderate or greater). Left ventricular diastolic function could not be evaluated. Right Ventricle: The right ventricular size is mild to moderately. No increase in right ventricular wall thickness. Right ventricular systolic function mildly reduced with normal basal excursion. Tricuspid regurgitation signal is inadequate for assessing  PA pressure. Left Atrium: Left atrial size was mildly dilated. Right Atrium: Right atrial size was mildly dilated. Pericardium: A small pericardial effusion is present. The pericardial effusion is circumferential. Mitral Valve: MR mechanism appears ventricular functional in nature, with blunting of the right sided pulmonary vein Doppler. The mitral valve is normal in structure. Moderate to severe mitral valve regurgitation. Tricuspid Valve: The tricuspid valve is normal in structure. Tricuspid valve regurgitation is not demonstrated. No evidence of tricuspid stenosis. Aortic Valve: The aortic valve is tricuspid. Aortic valve regurgitation is not visualized. No aortic stenosis is present. Aortic valve mean gradient measures 4.0 mmHg. Aortic valve peak gradient measures 7.1 mmHg. Aortic valve area, by VTI measures 2.40 cm. Pulmonic Valve: The pulmonic valve was normal in structure. Pulmonic valve regurgitation is not visualized. No evidence of pulmonic stenosis. Aorta: The aortic root and ascending aorta are structurally normal, with no evidence of dilitation. Venous: The inferior vena cava is dilated in size with less than 50% respiratory variability, suggesting right atrial pressure of 15 mmHg. IAS/Shunts: No atrial level shunt detected by color flow Doppler.  LEFT VENTRICLE PLAX 2D LVIDd:         6.10 cm   Diastology LVIDs:         5.40 cm   LV e' medial:    6.20 cm/s LV PW:         1.50 cm    LV E/e' medial:  22.7 LV IVS:        1.10 cm   LV e' lateral:   10.10 cm/s LVOT diam:     2.20 cm   LV E/e' lateral: 14.0 LV SV:         54 LV SV Index:   27 LVOT Area:     3.80 cm  RIGHT VENTRICLE             IVC RV S prime:     15.30 cm/s  IVC  diam: 2.80 cm TAPSE (M-mode): 1.6 cm LEFT ATRIUM             Index        RIGHT ATRIUM           Index LA diam:        4.30 cm 2.19 cm/m   RA Area:     20.20 cm LA Vol (A2C):   68.4 ml 34.85 ml/m  RA Volume:   62.30 ml  31.74 ml/m LA Vol (A4C):   73.3 ml 37.34 ml/m LA Biplane Vol: 70.8 ml 36.07 ml/m  AORTIC VALVE AV Area (Vmax):    2.88 cm AV Area (Vmean):   2.62 cm AV Area (VTI):     2.40 cm AV Vmax:           133.00 cm/s AV Vmean:          95.300 cm/s AV VTI:            0.223 m AV Peak Grad:      7.1 mmHg AV Mean Grad:      4.0 mmHg LVOT Vmax:         100.60 cm/s LVOT Vmean:        65.600 cm/s LVOT VTI:          0.141 m LVOT/AV VTI ratio: 0.63  AORTA Ao Root diam: 3.70 cm Ao Asc diam:  3.05 cm MV E velocity: 141.00 cm/s                             SHUNTS                             Systemic VTI:  0.14 m                             Systemic Diam: 2.20 cm Rudean Haskell MD Electronically signed by Rudean Haskell MD Signature Date/Time: 12/26/2022/4:27:48 PM    Final    MR BRAIN WO CONTRAST  Result Date: 12/26/2022 CLINICAL DATA:  Presented on 02/29 with left arm weakness. EXAM: MRI HEAD WITHOUT CONTRAST TECHNIQUE: Multiplanar, multiecho pulse sequences of the brain and surrounding structures were obtained without intravenous contrast. COMPARISON:  CT/CTA head and neck 12/25/2022 FINDINGS: Incomplete study with axial and coronal DWI, motion degraded axial T2 and FLAIR, and motion degraded axial GRE sequences obtained. Brain: There is moderate-to-large area of diffusion restriction in the right MCA distribution involving the posterior temporal lobe, insula, external capsule, and corona radiata consistent with acute infarct. There are also punctate acute  infarcts in the high right parietal cortex. There is associated FLAIR signal abnormality and edema with sulcal effacement but no midline shift. There is no definite evidence of hemorrhage. Background parenchymal volume is normal. The ventricles are normal in size. Parenchymal signal is otherwise normal. Vascular: The major flow voids are normal on the provided sequences. Skull and upper cervical spine: Suboptimally evaluated on the provided sequences. Sinuses/Orbits: The paranasal sinuses are clear. The globes and orbits are grossly unremarkable. Other: None. IMPRESSION: Moderate-to-large area of acute infarct in the right MCA distribution with associated edema and regional sulcal effacement but no midline shift or definite hemorrhage. Electronically Signed   By: Valetta Mole M.D.   On: 12/26/2022 10:11    PHYSICAL EXAM  Temp:  [98.2 F (36.8 C)-99.1 F (37.3 C)] 98.9 F (  37.2 C) (03/02 0440) Pulse Rate:  [87-109] 94 (03/02 0440) Resp:  [14-19] 14 (03/02 0440) BP: (135-143)/(88-101) 138/88 (03/02 0440) SpO2:  [95 %-98 %] 98 % (03/02 0440)  General -  Appears poorly nourished,  in no apparent distress. Cardiovascular - Regular rhythm and rate. Pulmonary -  unlabored breathing, no supplemental oxygen.   Mental Status -  Drowsy, flat affect, orientation to time, place, and person were intact. Language including expression, naming, repetition, comprehension was assessed and found intact.  Cranial Nerves II - XII - II - Visual field intact OU. III, IV, VI - Extraocular movements intact. V - Facial sensation intact bilaterally. VII - Slight L facial droop  VIII - Hearing & vestibular intact bilaterally. X - Palate elevates symmetrically, no hoarseness. XI - Chin turning & shoulder shrug intact bilaterally. XII - Tongue protrusion intact.  Motor Strength - Left hemiplegia with drift seen in left arm and leg, increased drift in left arm 3/5 proximal and distal.  3/5 left grip, 2/5 fine finger  movements. Bulk was normal and fasciculations were absent.   Motor Tone - Muscle tone was assessed at the neck and appendages and was normal.  Sensory - Light touch assessed, decreased on left face, LUE, LLE.   Coordination - The patient had normal movements in the hands and feet with no ataxia or dysmetria on right side, unable to assess left.  Tremor was absent.  Gait and Station - deferred.   ASSESSMENT/PLAN Jeremy Fritz is a 42 y.o. male with history significant for hypertension, polysubstance abuse including tobacco/THC/cocaine who presents with one week of L-sided weakness and found to have a R MCA infarct on CT head/MRI.   Acute Ischemic R MCA Stroke, likely due to large vessel disease with uncontrolled risk factors, versus cardiomyopathy with low EF, versus small right carotid web, versus infectious vasculitis from neurosyphilis CT head Acute or subacute right posterior MCA territory infarct. Regional mass effect with slight leftward midline shift. Probable hyperdense vessel within a right M2 MCA branch, suspicious for thrombus. CTA head & neck: Acute proximal stump right M3 occlusion. Subtle transversely oriented shelf-like defect at the origin of the cervical right ICA, suspicious for a small carotid web. No significant stenosis MRI: Moderate-to-large area of acute infarct in the right MCA distribution with associated edema and regional sulcal effacement but no midline shift or definite hemorrhage 2D Echo w/bubble: EF 25 to 30%, global hypokinesis, no LV thrombus Recommend TEE to evaluate for LV thrombus LDL 109 HgbA1c 5.9 UDS + cocaine and THC VTE prophylaxis -Lovenox No antithrombotic prior to admission, now on ASA and Plavix daily. Recommend aspirin and plavix for 3 months, then ASA alone given intracranial occlusion.  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 Patient advised to stop using due to stroke risk.  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 Note/plan to be edited by MD as needed.    Otelia Santee, DNP, AGACNP-BC [facility] Please use AMION for pager and EPIC for messaging  ATTENDING NOTE: I reviewed above note and agree with the assessment and plan. Pt was seen and examined.   Pt lying in bed, awake, alert, eyes open, orientated to age, place, time and people. No aphasia, fluent language, following all simple commands. Able to name and repeat and read. No gaze palsy, tracking bilaterally, visual field full, PERRL. Mild left facial droop. Tongue midline. RUE and BLEs 5/5, LUE 3+/5 proximal and 3/5 hand grip but 2/5 finger movement and 0/5 hand extension. Sensation decreased on the left face, UE and LE, right FTN intact, gait not tested.    MRI showed right MCA stroke and patient still has left hemiparesis.  EF 25 to 30%, although no LV thrombus on TTE, recommend TEE to further rule out LV thrombus, if positive will change management.  Continue DAPT for now and statin.  RPR high titer, concerning for syphilis and neurosyphilis.   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  Stroke risk factor modification, cessation education for tobacco, cocaine and THC provided.  PT and OT recommend CIR.  Will follow.  For detailed assessment and plan, please refer to above/below as I have made changes wherever appropriate.   Rosalin Hawking, MD PhD Stroke Neurology 12/27/2022 10:23 PM    To contact Stroke Continuity provider, please refer to [email or web address] After hours, contact General Neurology

## 2022-12-27 NOTE — Progress Notes (Signed)
Rounding Note    Patient Name: Jeremy Fritz Date of Encounter: 12/27/2022  [place] Cardiologist: Shelva Majestic, MD   Subjective   No CP or SOB, sleeping, family/friend at bedside.  Inpatient Medications    Scheduled Meds:  aspirin EC  81 mg Oral Daily   atorvastatin  40 mg Oral Daily   carvedilol  3.125 mg Oral BID WC   clopidogrel  75 mg Oral Daily   enoxaparin (LOVENOX) injection  40 mg Subcutaneous Q24H   losartan  12.5 mg Oral Daily   nicotine  21 mg Transdermal Daily   Continuous Infusions:  penicillin G potassium 12 Million Units in dextrose 5 % 500 mL CONTINUOUS infusion 41.7 mL/hr at 12/27/22 0616   PRN Meds: acetaminophen **OR** acetaminophen (TYLENOL) oral liquid 160 mg/5 mL **OR** acetaminophen, senna-docusate   Vital Signs    Vitals:   12/26/22 2004 12/26/22 2346 12/27/22 0440 12/27/22 0811  BP: (!) 143/101 (!) 140/92 138/88 (!) 149/114  Pulse: (!) 109 87 94 (!) 52  Resp: '16 19 14 17  '$ Temp: 99.1 F (37.3 C) 98.7 F (37.1 C) 98.9 F (37.2 C) 98.9 F (37.2 C)  TempSrc: Oral Oral Oral Oral  SpO2: 97% 95% 98% 97%  Weight:      Height:        Intake/Output Summary (Last 24 hours) at 12/27/2022 0831 Last data filed at 12/27/2022 0545 Gross per 24 hour  Intake 976.68 ml  Output --  Net 976.68 ml      12/25/2022   10:33 AM 10/09/2022    6:34 PM 12/08/2011    5:40 PM  Last 3 Weights  Weight (lbs) 164 lb 14.5 oz 165 lb   Weight (kg) 74.8 kg 74.844 kg      Information is confidential and restricted. Go to Review Flowsheets to unlock data.      Telemetry    Sinus tachycardia - Personally Reviewed  ECG    No new - Personally Reviewed  Physical Exam   GEN: No acute distress.   Neck: No JVD Cardiac: RRR, no murmurs, rubs, or gallops.  Respiratory: Clear to auscultation bilaterally. GI: Soft, nontender, non-distended  MS: No edema; No deformity. Neuro:  not assessed Psych: Normal affect   Labs    High Sensitivity  Troponin:  No results for input(s): "TROPONINIHS" in the last 720 hours.   Chemistry Recent Labs  Lab 12/25/22 1237 12/25/22 1329  NA 142 141  K 3.7 3.9  CL 105 107  CO2  --  22  GLUCOSE 110* 110*  BUN 20 19  CREATININE 1.10 0.96  CALCIUM  --  8.8*  PROT  --  6.6  ALBUMIN  --  2.8*  AST  --  31  ALT  --  22  ALKPHOS  --  50  BILITOT  --  0.5  GFRNONAA  --  >60  ANIONGAP  --  12    Lipids  Recent Labs  Lab 12/26/22 1217  CHOL 164  TRIG 82  HDL 39*  LDLCALC 109*  CHOLHDL 4.2    Hematology Recent Labs  Lab 12/25/22 1237 12/25/22 1329  WBC  --  5.6  RBC  --  4.21*  HGB 12.6* 12.2*  HCT 37.0* 37.0*  MCV  --  87.9  MCH  --  29.0  MCHC  --  33.0  RDW  --  15.3  PLT  --  267   Thyroid No results for input(s): "TSH", "FREET4" in  the last 168 hours.  BNPNo results for input(s): "BNP", "PROBNP" in the last 168 hours.  DDimer No results for input(s): "DDIMER" in the last 168 hours.   Radiology    ECHOCARDIOGRAM COMPLETE  Result Date: 12/26/2022    ECHOCARDIOGRAM REPORT   Patient Name:   Jeremy Fritz Date of Exam: 12/26/2022 Medical Rec #:  [id number]       Height:       72.0 in Accession #:    [id number]      Weight:       164.9 lb Date of Birth:  [birth year]      BSA:          1.963 m Patient Age:    42 years        BP:           124/85 mmHg Patient Gender: M               HR:           100 bpm. Exam Location:  Inpatient Procedure: 2D Echo, Cardiac Doppler and Color Doppler Indications:    Stroke I63.9  History:        Patient has no prior history of Echocardiogram examinations.                 Stroke; Risk Factors:Hypertension and Current Smoker.  Sonographer:    Ronny Flurry Referring Phys: [id number] ERIC J [place] IMPRESSIONS  1. Left ventricular ejection fraction, by estimation, is 25 to 30%. The left ventricle has severely decreased function. The left ventricle demonstrates global hypokinesis. The left ventricular internal cavity size was moderately dilated. There  is severe  left ventricular hypertrophy of the inferior segment. Left ventricular diastolic function could not be evaluated. There is no LV thrombus visualized.  2. Right ventricular systolic function mildly reduced with normal basal excursion. The right ventricular size is mild to moderately. Tricuspid regurgitation signal is inadequate for assessing PA pressure.  3. Left atrial size was mildly dilated.  4. Right atrial size was mildly dilated.  5. A small pericardial effusion is present. The pericardial effusion is circumferential.  6. MR mechanism appears ventricular functional in nature, with blunting of the right sided pulmonary vein Doppler. The mitral valve is normal in structure. Moderate to severe mitral valve regurgitation.  7. The aortic valve is tricuspid. Aortic valve regurgitation is not visualized. No aortic stenosis is present.  8. The inferior vena cava is dilated in size with <50% respiratory variability, suggesting right atrial pressure of 15 mmHg. FINDINGS  Left Ventricle: Left ventricular ejection fraction, by estimation, is 25 to 30%. The left ventricle has severely decreased function. The left ventricle demonstrates global hypokinesis. The left ventricular internal cavity size was moderately dilated. There is severe left ventricular hypertrophy of the inferior segment. Left ventricular diastolic function could not be evaluated due to mitral regurgitation (moderate or greater). Left ventricular diastolic function could not be evaluated. Right Ventricle: The right ventricular size is mild to moderately. No increase in right ventricular wall thickness. Right ventricular systolic function mildly reduced with normal basal excursion. Tricuspid regurgitation signal is inadequate for assessing  PA pressure. Left Atrium: Left atrial size was mildly dilated. Right Atrium: Right atrial size was mildly dilated. Pericardium: A small pericardial effusion is present. The pericardial effusion is  circumferential. Mitral Valve: MR mechanism appears ventricular functional in nature, with blunting of the right sided pulmonary vein Doppler. The mitral valve is normal in structure. Moderate to severe mitral  valve regurgitation. Tricuspid Valve: The tricuspid valve is normal in structure. Tricuspid valve regurgitation is not demonstrated. No evidence of tricuspid stenosis. Aortic Valve: The aortic valve is tricuspid. Aortic valve regurgitation is not visualized. No aortic stenosis is present. Aortic valve mean gradient measures 4.0 mmHg. Aortic valve peak gradient measures 7.1 mmHg. Aortic valve area, by VTI measures 2.40 cm. Pulmonic Valve: The pulmonic valve was normal in structure. Pulmonic valve regurgitation is not visualized. No evidence of pulmonic stenosis. Aorta: The aortic root and ascending aorta are structurally normal, with no evidence of dilitation. Venous: The inferior vena cava is dilated in size with less than 50% respiratory variability, suggesting right atrial pressure of 15 mmHg. IAS/Shunts: No atrial level shunt detected by color flow Doppler.  LEFT VENTRICLE PLAX 2D LVIDd:         6.10 cm   Diastology LVIDs:         5.40 cm   LV e' medial:    6.20 cm/s LV PW:         1.50 cm   LV E/e' medial:  22.7 LV IVS:        1.10 cm   LV e' lateral:   10.10 cm/s LVOT diam:     2.20 cm   LV E/e' lateral: 14.0 LV SV:         54 LV SV Index:   27 LVOT Area:     3.80 cm  RIGHT VENTRICLE             IVC RV S prime:     15.30 cm/s  IVC diam: 2.80 cm TAPSE (M-mode): 1.6 cm LEFT ATRIUM             Index        RIGHT ATRIUM           Index LA diam:        4.30 cm 2.19 cm/m   RA Area:     20.20 cm LA Vol (A2C):   68.4 ml 34.85 ml/m  RA Volume:   62.30 ml  31.74 ml/m LA Vol (A4C):   73.3 ml 37.34 ml/m LA Biplane Vol: 70.8 ml 36.07 ml/m  AORTIC VALVE AV Area (Vmax):    2.88 cm AV Area (Vmean):   2.62 cm AV Area (VTI):     2.40 cm AV Vmax:           133.00 cm/s AV Vmean:          95.300 cm/s AV VTI:             0.223 m AV Peak Grad:      7.1 mmHg AV Mean Grad:      4.0 mmHg LVOT Vmax:         100.60 cm/s LVOT Vmean:        65.600 cm/s LVOT VTI:          0.141 m LVOT/AV VTI ratio: 0.63  AORTA Ao Root diam: 3.70 cm Ao Asc diam:  3.05 cm MV E velocity: 141.00 cm/s                             SHUNTS                             Systemic VTI:  0.14 m  Systemic Diam: 2.20 cm Rudean Haskell MD Electronically signed by Rudean Haskell MD Signature Date/Time: 12/26/2022/4:27:48 PM    Final    MR BRAIN WO CONTRAST  Result Date: 12/26/2022 CLINICAL DATA:  Presented on 02/29 with left arm weakness. EXAM: MRI HEAD WITHOUT CONTRAST TECHNIQUE: Multiplanar, multiecho pulse sequences of the brain and surrounding structures were obtained without intravenous contrast. COMPARISON:  CT/CTA head and neck 12/25/2022 FINDINGS: Incomplete study with axial and coronal DWI, motion degraded axial T2 and FLAIR, and motion degraded axial GRE sequences obtained. Brain: There is moderate-to-large area of diffusion restriction in the right MCA distribution involving the posterior temporal lobe, insula, external capsule, and corona radiata consistent with acute infarct. There are also punctate acute infarcts in the high right parietal cortex. There is associated FLAIR signal abnormality and edema with sulcal effacement but no midline shift. There is no definite evidence of hemorrhage. Background parenchymal volume is normal. The ventricles are normal in size. Parenchymal signal is otherwise normal. Vascular: The major flow voids are normal on the provided sequences. Skull and upper cervical spine: Suboptimally evaluated on the provided sequences. Sinuses/Orbits: The paranasal sinuses are clear. The globes and orbits are grossly unremarkable. Other: None. IMPRESSION: Moderate-to-large area of acute infarct in the right MCA distribution with associated edema and regional sulcal effacement but no midline shift or  definite hemorrhage. Electronically Signed   By: Valetta Mole M.D.   On: 12/26/2022 10:11   CT ANGIO HEAD NECK W WO CM  Result Date: 12/25/2022 CLINICAL DATA:  Initial evaluation for neuro deficit, stroke. EXAM: CT ANGIOGRAPHY HEAD AND NECK TECHNIQUE: Multidetector CT imaging of the head and neck was performed using the standard protocol during bolus administration of intravenous contrast. Multiplanar CT image reconstructions and MIPs were obtained to evaluate the vascular anatomy. Carotid stenosis measurements (when applicable) are obtained utilizing NASCET criteria, using the distal internal carotid diameter as the denominator. RADIATION DOSE REDUCTION: This exam was performed according to the departmental dose-optimization program which includes automated exposure control, adjustment of the mA and/or kV according to patient size and/or use of iterative reconstruction technique. CONTRAST:  79m OMNIPAQUE IOHEXOL 350 MG/ML SOLN COMPARISON:  Prior CT from earlier the same day. FINDINGS: CTA NECK FINDINGS Aortic arch: Visualized aortic arch normal in caliber. Aberrant right subclavian artery noted. No stenosis about the origin of the great vessels. Right carotid system: Right common and internal carotid arteries are patent without dissection. Subtle transverse oriented Leigh shelf-like defect at the origin of the cervical right ICA, suspicious for a small carotid web (series 9, image 127). No significant stenosis. Left carotid system: Left common and internal carotid arteries are patent without stenosis or dissection. Vertebral arteries: Both vertebral arteries arise from subclavian arteries. No proximal subclavian artery stenosis. Left vertebral artery slightly dominant. Vertebral arteries are patent without stenosis or dissection. Skeleton: No discrete or worrisome osseous lesions. Mild cervical spondylosis without significant spinal stenosis. Other neck: No other acute soft tissue abnormality within the neck.  Upper chest: Mild scattered ground-glass density with interlobular septal thickening within the visualized lungs, likely mild pulmonary interstitial congestion/edema. Visualized upper chest demonstrates no other acute finding. Review of the MIP images confirms the above findings CTA HEAD FINDINGS Anterior circulation: Both internal carotid arteries are patent to the termini without stenosis or other abnormality. A1 segments patent bilaterally. Normal anterior communicating artery complex. Anterior cerebral arteries patent without stenosis. No M1 stenosis or occlusion. Distal left MCA branches widely patent and well perfused. On the right, there  is an acute proximal stump right M3 occlusion, inferior division (series 7, image 134), in keeping with the evolving right MCA territory infarct. Remainder of the right MCA branches are patent and perfused. Posterior circulation: Minimal nonstenotic plaque noted within the dominant left V4 segment. Right V4 widely patent as well. Both PICA patent. Basilar widely patent without stenosis. Superior cerebellar arteries patent bilaterally. Both PCAs primarily supplied via the basilar. Short-segment mild proximal left P2 stenosis noted. PCAs otherwise widely patent to their distal aspects. Venous sinuses: Patent allowing for timing the contrast bolus. Anatomic variants: None significant.  No aneurysm. Review of the MIP images confirms the above findings IMPRESSION: 1. Acute proximal stump right M3 occlusion, inferior division, in keeping with the evolving right MCA territory infarct. 2. Otherwise wide patency of the major arterial vasculature of the head and neck. No other emergent finding. 3. Subtle transversely oriented shelf-like defect at the origin of the cervical right ICA, suspicious for a small carotid web. No significant stenosis. 4. Aberrant right subclavian artery. 5. Mild scattered ground-glass density with interlobular septal thickening within the visualized lungs,  likely mild pulmonary interstitial congestion/edema Electronically Signed   By: Jeannine Boga M.D.   On: 12/25/2022 20:59   CT HEAD WO CONTRAST  Result Date: 12/25/2022 CLINICAL DATA:  Neuro deficit, acute, stroke suspected EXAM: CT HEAD WITHOUT CONTRAST TECHNIQUE: Contiguous axial images were obtained from the base of the skull through the vertex without intravenous contrast. RADIATION DOSE REDUCTION: This exam was performed according to the departmental dose-optimization program which includes automated exposure control, adjustment of the mA and/or kV according to patient size and/or use of iterative reconstruction technique. COMPARISON:  None Available. FINDINGS: Brain: Abnormal edema and loss of gray differentiation involving the posterior right insula as well as overlying frontal and parietal lobes, compatible with posterior right MCA territory infarct. Regional mass effect with slight leftward midline shift. No evidence of mass lesion or hydrocephalus. No mass occupying acute hemorrhage. Vascular: Probable hyperdense vessel within a right M2 MCA branch. Skull: No acute fracture. Sinuses/Orbits: Largely clear sinuses.  No acute orbital findings. Other: No mastoid effusions. IMPRESSION: 1. Acute or subacute right posterior MCA territory infarct. Regional mass effect with slight leftward midline shift. MRI could further characterize. 2. Probable hyperdense vessel within a right M2 MCA branch, suspicious for thrombus. A CTA could further evaluate. Electronically Signed   By: Margaretha Sheffield M.D.   On: 12/25/2022 13:03    Cardiac Studies     Patient Profile     42 y.o. male with polysubstance abuse and acute ischemic R MCA stroke, with newly recognized severe LV systolic dysfunction.  Assessment & Plan    Principal Problem:   Acute ischemic right MCA stroke (HCC) Active Problems:   Cocaine abuse ([place])   Acute CVA (cerebrovascular accident) ([place])   HTN (hypertension)   Continuous  tobacco abuse   HFrEF             -Possible etiology behind dilated cardiomyopathy with EF of 25% and include untreated hypertension, cocaine abuse and recent stroke.             -Patient denies any recent exertional chest pain.  Started on low-dose carvedilol.   - consider outpatient CCTA to rule out ischemia. Will be difficult to safely control HR for study with need for permissive HTN.  Heart Failure Therapy ACE-I/ARB/ARNI: losartan 12.5 mg daily. Consider entresto transition as permissive HTN period completes.   BB: coreg 3.125 mg BID - best  option with ongoing cocaine use. MRA: none yet SGLT2I: none yet, consider starting today, jardience 10 mg daily. Diuretic plan: appears compensated. May need PRN homegoing.  Moderate to severe MR: Likely functional from LV dysfunction. Reevaluate if HF sx or at follow up.   Acute right MCA stroke with resultant left-sided weakness: No LV thrombus on TTE report.   Hypertension: Has 5 mg amlodipine and 100 mg losartan listed, however per patient, he has run out of medication a long time ago and was only able to pick up new prescription recently.  Started on low-dose carvedilol and losartan 12.5 mg daily, titrate as tolerated from stroke perspective. See prob #1.   Obstructive sleep apnea: Not on CPAP therapy at this time   Polysubstance abuse: Cocaine, marijuana and tobacco.   Positive RVR: RVR titer 1: 64, started on penicillin for possible neurosyphilis.      For questions or updates, please contact Agency Please consult [email or web address] for contact info under        Signed, Elouise Munroe, MD  12/27/2022, 8:31 AM

## 2022-12-28 DIAGNOSIS — F1410 Cocaine abuse, uncomplicated: Secondary | ICD-10-CM | POA: Diagnosis not present

## 2022-12-28 DIAGNOSIS — I255 Ischemic cardiomyopathy: Secondary | ICD-10-CM

## 2022-12-28 DIAGNOSIS — I63511 Cerebral infarction due to unspecified occlusion or stenosis of right middle cerebral artery: Secondary | ICD-10-CM | POA: Diagnosis not present

## 2022-12-28 LAB — BRAIN NATRIURETIC PEPTIDE: B Natriuretic Peptide: 376.4 pg/mL — ABNORMAL HIGH (ref 0.0–100.0)

## 2022-12-28 MED ORDER — SODIUM CHLORIDE 0.9 % IV SOLN: INTRAVENOUS | Status: DC

## 2022-12-28 MED ORDER — LEVALBUTEROL HCL 0.63 MG/3ML IN NEBU
0.6300 mg | INHALATION_SOLUTION | Freq: Four times a day (QID) | RESPIRATORY_TRACT | Status: DC | PRN
  Administered 2022-12-28: 0.63 mg via RESPIRATORY_TRACT
  Filled 2022-12-28: qty 3

## 2022-12-28 MED ORDER — TRAZODONE HCL 50 MG PO TABS
25.0000 mg | ORAL_TABLET | Freq: Every evening | ORAL | Status: DC | PRN
  Administered 2022-12-28: 25 mg via ORAL
  Filled 2022-12-28 (×3): qty 1

## 2022-12-28 MED ORDER — FUROSEMIDE 10 MG/ML IJ SOLN
20.0000 mg | Freq: Once | INTRAMUSCULAR | Status: AC
  Administered 2022-12-28: 20 mg via INTRAVENOUS
  Filled 2022-12-28: qty 4

## 2022-12-28 MED ORDER — EMPAGLIFLOZIN 10 MG PO TABS
10.0000 mg | ORAL_TABLET | Freq: Every day | ORAL | Status: DC
  Administered 2022-12-28 – 2022-12-31 (×4): 10 mg via ORAL
  Filled 2022-12-28 (×4): qty 1

## 2022-12-28 NOTE — PMR Pre-admission (Signed)
PMR Admission Coordinator Pre-Admission Assessment  Patient: Jeremy Fritz is an 42 y.o., male MRN: [id number] DOB: [birth year] Height: 6' (182.9 cm) Weight: 74.8 kg  Insurance Information HMO: yes     PPO:  PCP:      IPA:      80/20:      OTHER:    PRIMARY: [employer] [place] [employer]     Policy#: [id number]      Subscriber: pt.  CM Name: ***      Phone#: ***     Fax#: *** Pre-Cert#: ***      Employer: *** Benefits:  Phone #: ***     Name: *** Irene Shipper. Date: ***     Deduct: ***      Out of Pocket Max: ***      Life Max: *** CIR: ***      SNF: *** Outpatient: ***     Co-Pay: *** Home Health: ***      Co-Pay: *** DME: ***     Co-Pay: *** Providers: *** SECONDARY:       Policy#:      Phone#:   Financial Counselor:       Phone#:   The "Data Collection Information Summary" for patients in Inpatient Rehabilitation Facilities with attached "Privacy Act [place] Records" was provided and verbally reviewed with: N/A  Emergency Contact Information Contact Information     Name Relation Home Work Mobile   Buys,Roseanne Mother [phone]         Current Medical History  Patient Admitting Diagnosis: CVA History of Present Illness: Jeremy Fritz is a 43 y.o. male with medical history significant of essential hypertension, polysubstance abuse with tobacco/THC/cocaine who presented to [facility] ED on 11/2922 with left arm weakness.  Patient reports onset roughly 1 week prior to admission, he reports that awoke from sleep and went to the fridge and tried to get a drink out and dropped it to the floor.  Patient did not seek care until his mother noticed his symptoms. Patient endorses continued tobacco abuse.  He also reports that he has a history of high cholesterol currently not treated.  Also family history of diabetes, heart disease, hyperlipidemia.  Patient denied headache, no dizziness, no chest pain, no palpitations, no shortness of breath, no abdominal pain, no fever/chills/night  sweats, no nausea/vomiting/diarrhea, no paresthesias. Mother reports history of crack cocaine use. UDS positive for THC and cocaine. In the ED, temperature 98.6 F, HR 99, RR 18, BP 145/97, SpO2 100% room air.  Sodium 141, potassium 3.9, chloride 107, CO2 22, glucose 110, BUN 19, creatinine 0.96.  AST 31, ALT 22, total bilirubin 0.5.  WBC 5.6, hemoglobin 12.2, platelet count 267.  INR 1.1.  EtOH level less than 10.  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 MRI: Moderate-to-large area of acute infarct in the right MCA distribution with associated edema and regional sulcal effacement but no midline shift or definite hemorrhage.  2D Echo w/bubble: EF 25 to 30%, global hypokinesis, no LV thrombus. TEE recommended to evaluate for LV thrombus. LDL 109, HgbA1c 5.9. Pt.  Also found to have neurosyphilis. Pt. Refused LP, ID recommended IV penicillin. Pt. Seen by PT, OT, SLP and they recommend CIR to assist return to PLOF.  Complete NIHSS TOTAL: 7  Patient's medical record from [facility] has been reviewed by the  rehabilitation admission coordinator and physician.  Past Medical History  Past Medical History:  Diagnosis Date   Narcotic abuse in remission [facility])     Has the patient had major surgery during 100 days prior to admission? No  Family History   family history includes Heart failure in his father; Hyperlipidemia in his mother; Hypertension in his mother.  Current Medications  Current Facility-Administered Medications:    acetaminophen (TYLENOL) tablet 650 mg, 650 mg, Oral, Q4H PRN, 650 mg at 12/28/22 1305 **OR** acetaminophen (TYLENOL) 160 MG/5ML solution 650 mg, 650 mg, Per Tube, Q4H PRN **OR** acetaminophen (TYLENOL) suppository 650 mg, 650 mg, Rectal, Q4H PRN, [place] Eric J, DO   aspirin EC tablet 81 mg, 81  mg, Oral, Daily, Derek Jack, MD, 81 mg at 12/28/22 [id number]   atorvastatin (LIPITOR) tablet 40 mg, 40 mg, Oral, Daily, [place] Donnamarie Poag, DO, 40 mg at 12/28/22 [id number]   carvedilol (COREG) tablet 3.125 mg, 3.125 mg, Oral, BID WC, Troy Sine, MD, 3.125 mg at 12/28/22 [id number]   clopidogrel (PLAVIX) tablet 75 mg, 75 mg, Oral, Daily, Derek Jack, MD, 75 mg at 12/28/22 [id number]   empagliflozin (JARDIANCE) tablet 10 mg, 10 mg, Oral, Daily, Cherlynn Kaiser A, MD   enoxaparin (LOVENOX) injection 40 mg, 40 mg, Subcutaneous, Q24H, [place] Eric J, DO, 40 mg at 12/27/22 2115   furosemide (LASIX) injection 20 mg, 20 mg, Intravenous, Once, Elouise Munroe, MD   losartan (COZAAR) tablet 12.5 mg, 12.5 mg, Oral, Daily, Troy Sine, MD, 12.5 mg at 12/28/22 [id number]   nicotine (NICODERM CQ - dosed in mg/24 hours) patch 21 mg, 21 mg, Transdermal, Daily, [place] Eric J, DO   penicillin G potassium 12 Million Units in dextrose 5 % 500 mL CONTINUOUS infusion, 12 Million Units, Intravenous, Q12H, [place] Eric J, DO, Last Rate: 41.7 mL/hr at 12/28/22 1309, 12 Million Units at 12/28/22 1309   senna-docusate (Senokot-S) tablet 1 tablet, 1 tablet, Oral, QHS PRN, [place] Donnamarie Poag, DO  Patients Current Diet:  Diet Order             Diet NPO time specified Except for: Sips with Meds  Diet effective midnight           Diet Heart Room service appropriate? Yes; Fluid consistency: Thin  Diet effective now                   Precautions / Restrictions Precautions Precautions: Fall Precaution Comments: L inattention Restrictions Weight Bearing Restrictions: No   Has the patient had 2 or more falls or a fall with injury in the past year? No  Prior Activity Level Community (5-7x/wk): Pt. active in the community PTA  Prior Functional Level Self Care: Did the patient need help bathing, dressing, using the toilet or eating? Independent  Indoor Mobility: Did the patient need assistance with walking from room to  room (with or without device)? Independent  Stairs: Did the patient need assistance with internal or external stairs (with or without device)? Independent  Functional Cognition: Did the patient need help planning regular tasks such as shopping or remembering to take medications? Independent  Patient Information Are you of Hispanic, Latino/a,or Spanish origin?: A. No, not of Hispanic, Latino/a, or Spanish origin What is your race?: B. Black or African American Do you need or want an interpreter to communicate with a doctor or health care staff?: 0. No  Patient's Response To:  Health Literacy and Transportation Is the patient able to respond to  health literacy and transportation needs?: Yes Health Literacy - How often do you need to have someone help you when you read instructions, pamphlets, or other written material from your doctor or pharmacy?: Never In the past 12 months, has lack of transportation kept you from medical appointments or from getting medications?: No In the past 12 months, has lack of transportation kept you from meetings, work, or from getting things needed for daily living?: No  Development worker, international aid / [place] Devices/Equipment: None Home Equipment: Grab bars - toilet  Prior Device Use: Indicate devices/aids used by the patient prior to current illness, exacerbation or injury? None of the above  Current Functional Level Cognition  Arousal/Alertness: Awake/alert Overall Cognitive Status: Impaired/Different from baseline Current Attention Level: Sustained Orientation Level: Oriented to person, Oriented to place, Oriented to situation Following Commands: Follows one step commands consistently, Follows one step commands with increased time, Follows multi-step commands inconsistently Safety/Judgement: Decreased awareness of safety, Decreased awareness of deficits General Comments: Patient unaware or unwilling to accept that he has had a stroke.  He  presents with a flat affect, easily frustrated.  Cognition limited to functional tasks to avoid agitation.  He follow simple commands with increased time, demonstrates POOR awareness to deficits and safety. Pt with L inattention, resulting in him almost running straight into the doorframe of his room, needing assistance to stop him and safely direct him through the doorframe and pt stating it was because PT was "pulling him" that he almost hit the doorframe, unwilling to accept info in regards to his deficits provided by PT. Attention: Sustained Sustained Attention: Impaired Sustained Attention Impairment: Verbal complex Memory: Impaired Memory Impairment: Decreased recall of new information Awareness: Impaired Awareness Impairment: Intellectual impairment, Emergent impairment, Anticipatory impairment Problem Solving: Impaired Problem Solving Impairment: Verbal complex Behaviors: Impulsive, Poor frustration tolerance Safety/Judgment: Impaired    Extremity Assessment (includes Sensation/Coordination)  Upper Extremity Assessment: Defer to OT evaluation LUE Deficits / Details: grossly 3-/5 MMT, poor coordination, proprioception and sensation.  POOR awareness to UE. LUE Sensation: decreased light touch, decreased proprioception LUE Coordination: decreased fine motor, decreased gross motor  Lower Extremity Assessment: LLE deficits/detail LLE Deficits / Details: MMT scores of 4- hip flexion, 4+ knee extension, 4 ankle dorsiflexion; denies numbness/tingling, but based on functional mobility he appears to have some sensation deficits; proprioception impaired; incoordination noted; poor awareness to L leg LLE Sensation: decreased proprioception, decreased light touch LLE Coordination: decreased fine motor, decreased gross motor    ADLs  Overall ADL's : Needs assistance/impaired Grooming: Minimal assistance, Standing Upper Body Dressing : Minimal assistance, Standing Lower Body Dressing: Minimal  assistance, Sit to/from stand Toilet Transfer: Minimal assistance, Ambulation, +2 for safety/equipment Toileting- Clothing Manipulation and Hygiene: Minimal assistance, Sit to/from stand Functional mobility during ADLs: Minimal assistance, +2 for safety/equipment, Cueing for safety    Mobility  Overal bed mobility: Needs Assistance Bed Mobility: Supine to Sit Supine to sit: Min assist, HOB elevated General bed mobility comments: impulsively transitioning to EOB and requires min assist to maintain balance upright at EOB    Transfers  Overall transfer level: Needs assistance Equipment used: None Transfers: Sit to/from Stand Sit to Stand: Min assist, +2 safety/equipment General transfer comment: refused use of RW, min assist to power up and steady at EOB.  Poor awareness to L side.    Ambulation / Gait / Stairs / Wheelchair Mobility  Ambulation/Gait Ambulation/Gait assistance: Mod assist, +2 safety/equipment Gait Distance (Feet): 120 Feet Assistive device: None Gait Pattern/deviations: Step-through  pattern, Decreased step length - left, Decreased dorsiflexion - left, Knee flexed in stance - left, Drifts right/left General Gait Details: Pt with very long strides, but unsteady with poor awareness of him dragging his L leg, often dragging it along with him. Needs repeated cues to slow down and attend to his L extremities to improve his stability and safety, but poor carryover. Noted L knee instability but no buckling. Pt drifts to the L and almost ran straight into the L side of the doorframe, needing physical assistance to avoid it. When informed of this, pt reluctant to accept it was due to his deficits, stating it was due to him "being pulled" by therapist. +2 for safety due to impulsivity and difficulty attending to L and following cues. Gait velocity: reduced Gait velocity interpretation: >2.62 ft/sec, indicative of community ambulatory    Posture / Balance Dynamic Sitting Balance Sitting  balance - Comments: Poor trunk coordination, impulsive quick movements with poor control at EOB, minA to prevent LOB intermittently. Balance Overall balance assessment: Needs assistance Sitting-balance support: No upper extremity supported, Feet supported Sitting balance-Leahy Scale: Fair Sitting balance - Comments: Poor trunk coordination, impulsive quick movements with poor control at EOB, minA to prevent LOB intermittently. Standing balance support: No upper extremity supported, During functional activity Standing balance-Leahy Scale: Poor Standing balance comment: Needs up to modA to prevent LOB and injury    Special needs/care consideration Special service needs ***   Previous Home Environment (from acute therapy documentation) Living Arrangements: Non-relatives/Friends  Lives With: Friend(s) Available Help at Discharge: Friend(s) Type of Home: Mobile home Home Layout: One level Home Access: Stairs to enter Entrance Stairs-Rails: Can reach both Entrance Stairs-Number of Steps: 2 Bathroom Shower/Tub: Multimedia programmer: Standard Bathroom Accessibility: Yes How Accessible: Accessible via walker, Accessible via wheelchair [place]: No  Discharge Living Setting Plans for Discharge Living Setting: Patient's home Type of Home at Discharge: House Discharge Home Layout: One level Discharge Home Access: Stairs to enter Entrance Stairs-Rails: Left, Right, Can reach both Entrance Stairs-Number of Steps: 2 Discharge Bathroom Shower/Tub: Walk-in shower Discharge Bathroom Toilet: Standard Discharge Bathroom Accessibility: Yes How Accessible: Accessible via walker, Accessible via wheelchair Does the patient have any problems obtaining your medications?: No  Social/Family/Support Systems Patient Roles: Parent Contact Information: [phone] Anticipated Caregiver: Garnet Sierras Anticipated Caregiver's Contact Information: Can provide Min-mod A, works as a  caregiver Ability/Limitations of Caregiver: 24/7 min-mod A Caregiver Availability: 24/7 Discharge Plan Discussed with Primary Caregiver: Yes Is Caregiver In Agreement with Plan?: Yes Does Caregiver/Family have Issues with Lodging/Transportation while Pt is in Rehab?: No  Goals Patient/Family Goal for Rehab: PT/OT/SLP Supervision Expected length of stay: 14-16 days Pt/Family Agrees to Admission and willing to participate: Yes Program Orientation Provided & Reviewed with Pt/Caregiver Including Roles  & Responsibilities: Yes  Decrease burden of Care through IP rehab admission: not anticipated  Possible need for SNF placement upon discharge: not anticipated   Patient Condition: I have reviewed medical records from [facility], spoken with CM, and patient and family member. I met with patient at the bedside for inpatient rehabilitation assessment.  Patient will benefit from ongoing PT, OT, and SLP, can actively participate in 3 hours of therapy a day 5 days of the week, and can make measurable gains during the admission.  Patient will also benefit from the coordinated team approach during an Inpatient Acute Rehabilitation admission.  The patient will receive intensive therapy as well as Rehabilitation physician, nursing, social worker,  and care management interventions.  Due to safety, skin/wound care, disease management, medication administration, pain management, and patient education the patient requires 24 hour a day rehabilitation nursing.  The patient is currently *** with mobility and basic ADLs.  Discharge setting and therapy post discharge at home with home health is anticipated.  Patient has agreed to participate in the Acute Inpatient Rehabilitation Program and will admit {Time; today/tomorrow:10263}.  Preadmission Screen Completed By:  Genella Mech, 12/28/2022 2:26 PM ______________________________________________________________________   Discussed status with Dr. Marland Kitchen on  *** at *** and received approval for admission today.  Admission Coordinator:  Genella Mech, CCC-SLP, time Marland KitchenSudie Grumbling ***   Assessment/Plan: Diagnosis: Does the need for close, 24 hr/day Medical supervision in concert with the patient's rehab needs make it unreasonable for this patient to be served in a less intensive setting? {yes_no_potentially:3041433} Co-Morbidities requiring supervision/potential complications: *** Due to {due RE:257123, does the patient require 24 hr/day rehab nursing? {yes_no_potentially:3041433} Does the patient require coordinated care of a physician, rehab nurse, PT, OT, and SLP to address physical and functional deficits in the context of the above medical diagnosis(es)? {yes_no_potentially:3041433} Addressing deficits in the following areas: {deficits:3041436} Can the patient actively participate in an intensive therapy program of at least 3 hrs of therapy 5 days a week? {yes_no_potentially:3041433} The potential for patient to make measurable gains while on inpatient rehab is {potential:3041437} Anticipated functional outcomes upon discharge from inpatient rehab: {functional outcomes:304600100} PT, {functional outcomes:304600100} OT, {functional outcomes:304600100} SLP Estimated rehab length of stay to reach the above functional goals is: *** Anticipated discharge destination: {anticipated dc setting:21604} 10. Overall Rehab/Functional Prognosis: {potential:3041437}   MD Signature: ***

## 2022-12-28 NOTE — Progress Notes (Signed)
Rounding Note    Patient Name: Jeremy Fritz Date of Encounter: 12/28/2022  [place] Cardiologist: Shelva Majestic, MD   Subjective   No CP. Mild orthopnea, states he gets SOB when it's hot.   Inpatient Medications    Scheduled Meds:  aspirin EC  81 mg Oral Daily   atorvastatin  40 mg Oral Daily   carvedilol  3.125 mg Oral BID WC   clopidogrel  75 mg Oral Daily   enoxaparin (LOVENOX) injection  40 mg Subcutaneous Q24H   losartan  12.5 mg Oral Daily   nicotine  21 mg Transdermal Daily   Continuous Infusions:  penicillin G potassium 12 Million Units in dextrose 5 % 500 mL CONTINUOUS infusion 12 Million Units (12/27/22 2352)   PRN Meds: acetaminophen **OR** acetaminophen (TYLENOL) oral liquid 160 mg/5 mL **OR** acetaminophen, senna-docusate   Vital Signs    Vitals:   12/27/22 2328 12/28/22 0329 12/28/22 0429 12/28/22 0818  BP: (!) 140/106 (!) 156/124 (!) 141/121 (!) 147/108  Pulse: (!) 105 (!) 117 (!) 108 (!) 56  Resp: '18 18 20 19  '$ Temp: 98.5 F (36.9 C) 98.9 F (37.2 C) 98.3 F (36.8 C) 97.8 F (36.6 C)  TempSrc: Oral  Oral Oral  SpO2: 96% 95% 95% 97%  Weight:      Height:        Intake/Output Summary (Last 24 hours) at 12/28/2022 0839 Last data filed at 12/27/2022 1800 Gross per 24 hour  Intake 375.3 ml  Output --  Net 375.3 ml      12/25/2022   10:33 AM 10/09/2022    6:34 PM 12/08/2011    5:40 PM  Last 3 Weights  Weight (lbs) 164 lb 14.5 oz 165 lb   Weight (kg) 74.8 kg 74.844 kg      Information is confidential and restricted. Go to Review Flowsheets to unlock data.      Telemetry    Sinus tachycardia - Personally Reviewed  ECG    No new - Personally Reviewed  Physical Exam   GEN: No acute distress.   Neck: No JVD Cardiac: tachycardic, normal rhythm, no murmurs, rubs, or gallops.  Respiratory: Clear to auscultation bilaterally. GI: Soft, nontender, non-distended  MS: No edema; No deformity. Neuro:  not assessed Psych: Normal  affect   Labs    High Sensitivity Troponin:  No results for input(s): "TROPONINIHS" in the last 720 hours.   Chemistry Recent Labs  Lab 12/25/22 1237 12/25/22 1329  NA 142 141  K 3.7 3.9  CL 105 107  CO2  --  22  GLUCOSE 110* 110*  BUN 20 19  CREATININE 1.10 0.96  CALCIUM  --  8.8*  PROT  --  6.6  ALBUMIN  --  2.8*  AST  --  31  ALT  --  22  ALKPHOS  --  50  BILITOT  --  0.5  GFRNONAA  --  >60  ANIONGAP  --  12    Lipids  Recent Labs  Lab 12/26/22 1217  CHOL 164  TRIG 82  HDL 39*  LDLCALC 109*  CHOLHDL 4.2    Hematology Recent Labs  Lab 12/25/22 1237 12/25/22 1329  WBC  --  5.6  RBC  --  4.21*  HGB 12.6* 12.2*  HCT 37.0* 37.0*  MCV  --  87.9  MCH  --  29.0  MCHC  --  33.0  RDW  --  15.3  PLT  --  267  Thyroid No results for input(s): "TSH", "FREET4" in the last 168 hours.  BNPNo results for input(s): "BNP", "PROBNP" in the last 168 hours.  DDimer No results for input(s): "DDIMER" in the last 168 hours.   Radiology    ECHOCARDIOGRAM COMPLETE  Result Date: 12/26/2022    ECHOCARDIOGRAM REPORT   Patient Name:   [name] Date of Exam: 12/26/2022 Medical Rec #:  [id number]       Height:       72.0 in Accession #:    [id number]      Weight:       164.9 lb Date of Birth:  13-Aug-1981      BSA:          1.963 m Patient Age:    42 years        BP:           124/85 mmHg Patient Gender: M               HR:           100 bpm. Exam Location:  Inpatient Procedure: 2D Echo, Cardiac Doppler and Color Doppler Indications:    Stroke I63.9  History:        Patient has no prior history of Echocardiogram examinations.                 Stroke; Risk Factors:Hypertension and Current Smoker.  Sonographer:    Ronny Flurry Referring Phys: [id number] ERIC J [place] IMPRESSIONS  1. Left ventricular ejection fraction, by estimation, is 25 to 30%. The left ventricle has severely decreased function. The left ventricle demonstrates global hypokinesis. The left ventricular internal  cavity size was moderately dilated. There is severe  left ventricular hypertrophy of the inferior segment. Left ventricular diastolic function could not be evaluated. There is no LV thrombus visualized.  2. Right ventricular systolic function mildly reduced with normal basal excursion. The right ventricular size is mild to moderately. Tricuspid regurgitation signal is inadequate for assessing PA pressure.  3. Left atrial size was mildly dilated.  4. Right atrial size was mildly dilated.  5. A small pericardial effusion is present. The pericardial effusion is circumferential.  6. MR mechanism appears ventricular functional in nature, with blunting of the right sided pulmonary vein Doppler. The mitral valve is normal in structure. Moderate to severe mitral valve regurgitation.  7. The aortic valve is tricuspid. Aortic valve regurgitation is not visualized. No aortic stenosis is present.  8. The inferior vena cava is dilated in size with <50% respiratory variability, suggesting right atrial pressure of 15 mmHg. FINDINGS  Left Ventricle: Left ventricular ejection fraction, by estimation, is 25 to 30%. The left ventricle has severely decreased function. The left ventricle demonstrates global hypokinesis. The left ventricular internal cavity size was moderately dilated. There is severe left ventricular hypertrophy of the inferior segment. Left ventricular diastolic function could not be evaluated due to mitral regurgitation (moderate or greater). Left ventricular diastolic function could not be evaluated. Right Ventricle: The right ventricular size is mild to moderately. No increase in right ventricular wall thickness. Right ventricular systolic function mildly reduced with normal basal excursion. Tricuspid regurgitation signal is inadequate for assessing  PA pressure. Left Atrium: Left atrial size was mildly dilated. Right Atrium: Right atrial size was mildly dilated. Pericardium: A small pericardial effusion is present.  The pericardial effusion is circumferential. Mitral Valve: MR mechanism appears ventricular functional in nature, with blunting of the right sided pulmonary vein Doppler. The mitral valve  is normal in structure. Moderate to severe mitral valve regurgitation. Tricuspid Valve: The tricuspid valve is normal in structure. Tricuspid valve regurgitation is not demonstrated. No evidence of tricuspid stenosis. Aortic Valve: The aortic valve is tricuspid. Aortic valve regurgitation is not visualized. No aortic stenosis is present. Aortic valve mean gradient measures 4.0 mmHg. Aortic valve peak gradient measures 7.1 mmHg. Aortic valve area, by VTI measures 2.40 cm. Pulmonic Valve: The pulmonic valve was normal in structure. Pulmonic valve regurgitation is not visualized. No evidence of pulmonic stenosis. Aorta: The aortic root and ascending aorta are structurally normal, with no evidence of dilitation. Venous: The inferior vena cava is dilated in size with less than 50% respiratory variability, suggesting right atrial pressure of 15 mmHg. IAS/Shunts: No atrial level shunt detected by color flow Doppler.  LEFT VENTRICLE PLAX 2D LVIDd:         6.10 cm   Diastology LVIDs:         5.40 cm   LV e' medial:    6.20 cm/s LV PW:         1.50 cm   LV E/e' medial:  22.7 LV IVS:        1.10 cm   LV e' lateral:   10.10 cm/s LVOT diam:     2.20 cm   LV E/e' lateral: 14.0 LV SV:         54 LV SV Index:   27 LVOT Area:     3.80 cm  RIGHT VENTRICLE             IVC RV S prime:     15.30 cm/s  IVC diam: 2.80 cm TAPSE (M-mode): 1.6 cm LEFT ATRIUM             Index        RIGHT ATRIUM           Index LA diam:        4.30 cm 2.19 cm/m   RA Area:     20.20 cm LA Vol (A2C):   68.4 ml 34.85 ml/m  RA Volume:   62.30 ml  31.74 ml/m LA Vol (A4C):   73.3 ml 37.34 ml/m LA Biplane Vol: 70.8 ml 36.07 ml/m  AORTIC VALVE AV Area (Vmax):    2.88 cm AV Area (Vmean):   2.62 cm AV Area (VTI):     2.40 cm AV Vmax:           133.00 cm/s AV Vmean:           95.300 cm/s AV VTI:            0.223 m AV Peak Grad:      7.1 mmHg AV Mean Grad:      4.0 mmHg LVOT Vmax:         100.60 cm/s LVOT Vmean:        65.600 cm/s LVOT VTI:          0.141 m LVOT/AV VTI ratio: 0.63  AORTA Ao Root diam: 3.70 cm Ao Asc diam:  3.05 cm MV E velocity: 141.00 cm/s                             SHUNTS                             Systemic VTI:  0.14 m  Systemic Diam: 2.20 cm Rudean Haskell MD Electronically signed by Rudean Haskell MD Signature Date/Time: 12/26/2022/4:27:48 PM    Final    MR BRAIN WO CONTRAST  Result Date: 12/26/2022 CLINICAL DATA:  Presented on 02/29 with left arm weakness. EXAM: MRI HEAD WITHOUT CONTRAST TECHNIQUE: Multiplanar, multiecho pulse sequences of the brain and surrounding structures were obtained without intravenous contrast. COMPARISON:  CT/CTA head and neck 12/25/2022 FINDINGS: Incomplete study with axial and coronal DWI, motion degraded axial T2 and FLAIR, and motion degraded axial GRE sequences obtained. Brain: There is moderate-to-large area of diffusion restriction in the right MCA distribution involving the posterior temporal lobe, insula, external capsule, and corona radiata consistent with acute infarct. There are also punctate acute infarcts in the high right parietal cortex. There is associated FLAIR signal abnormality and edema with sulcal effacement but no midline shift. There is no definite evidence of hemorrhage. Background parenchymal volume is normal. The ventricles are normal in size. Parenchymal signal is otherwise normal. Vascular: The major flow voids are normal on the provided sequences. Skull and upper cervical spine: Suboptimally evaluated on the provided sequences. Sinuses/Orbits: The paranasal sinuses are clear. The globes and orbits are grossly unremarkable. Other: None. IMPRESSION: Moderate-to-large area of acute infarct in the right MCA distribution with associated edema and regional sulcal effacement  but no midline shift or definite hemorrhage. Electronically Signed   By: Valetta Mole M.D.   On: 12/26/2022 10:11    Cardiac Studies     Patient Profile     42 y.o. male with polysubstance abuse and acute ischemic R MCA stroke, with newly recognized severe LV systolic dysfunction.  Assessment & Plan    Principal Problem:   Acute ischemic right MCA stroke (HCC) Active Problems:   Cocaine abuse ([place])   Acute CVA (cerebrovascular accident) ([place])   HTN (hypertension)   Continuous tobacco abuse   HFrEF Orthopnea/sob - EF of 25%, etiology may include untreated hypertension, cocaine abuse and recent stroke. -Patient denies any recent exertional chest pain.  Started on low-dose carvedilol.   - consider outpatient CCTA to rule out ischemia, he is currently tachycardic and it will be difficult to safely control HR for coronary study with need for permissive HTN while inpatient. -c/o SOB, will give one time dose of lasix and monitor response.  Heart Failure Therapy ACE-I/ARB/ARNI: losartan 12.5 mg daily. Consider entresto transition as permissive HTN period completes.   BB: coreg 3.125 mg BID - best option with ongoing cocaine use. MRA: none yet SGLT2I: start jardience 10 mg daily. Diuretic plan: one time dose of lasix 20 mg IV for orthopnea. No le edema, lungs grossly clear.  Moderate to severe MR: Likely functional from LV dysfunction. Reevaluate if HF sx or at follow up.   Acute right MCA stroke with resultant left-sided weakness: No LV thrombus on TTE report. Will coordinate TEE per neuro request.  INFORMED CONSENT: After careful review of history and examination, the risks and benefits of transesophageal echocardiogram have been explained. Risks include esophageal damage, perforation (1:10,000 risk), bleeding, tooth fracture, pharyngeal hematoma as well as other potential complications associated with conscious sedation including aspiration, arrhythmia, respiratory failure and death.  Alternatives to treatment were discussed, questions were answered. Patient is willing to proceed.     Hypertension: Has 5 mg amlodipine and 100 mg losartan listed, however per patient, he has run out of medication a long time ago and was only able to pick up new prescription recently.  Started on low-dose carvedilol and losartan  12.5 mg daily, titrate as tolerated from stroke perspective. See prob #1.   Obstructive sleep apnea: Not on CPAP therapy at this time   Polysubstance abuse: Cocaine, marijuana and tobacco.   Positive RVR: RVR titer 1: 64, started on penicillin for possible neurosyphilis.      For questions or updates, please contact [place] Please consult [email or web address] for contact info under        Signed, Elouise Munroe, MD  12/28/2022, 8:39 AM

## 2022-12-28 NOTE — Progress Notes (Signed)
Pt c/o SOB. Lung sounds diminished but clear. RR 24 BP 156/124 HR 117 o2 95% on RA. On call provider paged.

## 2022-12-28 NOTE — Plan of Care (Signed)
  Problem: Education: Goal: Knowledge of disease or condition will improve Outcome: Progressing Goal: Knowledge of secondary prevention will improve (MUST DOCUMENT ALL) Outcome: Progressing Goal: Knowledge of patient specific risk factors will improve (Mark N/A or DELETE if not current risk factor) Outcome: Progressing   

## 2022-12-28 NOTE — Progress Notes (Signed)
PROGRESS NOTE    Jeremy Fritz  [id number] DOB: 20-Apr-1981 DOA: 12/25/2022 PCP: Patient, No Pcp Per    Brief Narrative:   Jeremy Fritz is a 42 y.o. male with past medical history significant for essential hypertension, polysubstance abuse with tobacco/THC/cocaine who presented to [facility] ED on 2/29 with left arm weakness.  Patient reports onset roughly 1 week ago, he reports awoke from sleep and went to the fridge rater and tried to get a drink out and dropped it to the floor.  Patient did not seek care until his mother noticed his symptoms.  Patient endorses continued tobacco abuse.  He also reports that he has a history of high cholesterol currently not treated.  Also family history of diabetes, heart disease, hyperlipidemia.  Patient denies headache, no dizziness, no chest pain, no palpitations, no shortness of breath, no abdominal pain, no fever/chills/night sweats, no nausea/vomiting/diarrhea, no paresthesias.   In the ED, temperature 98.6 F, HR 99, RR 18, BP 145/97, SpO2 100% room air.  Sodium 141, potassium 3.9, chloride 107, CO2 22, glucose 110, BUN 19, creatinine 0.96.  AST 31, ALT 22, total bilirubin 0.5.  WBC 5.6, hemoglobin 12.2, platelet count 267.  INR 1.1.  EtOH level less than 10.  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  Neurology was consulted.  TRH consulted for admission for further evaluation management of acute versus subacute CVA.  Assessment & Plan:   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  MR brain with findings of moderate to  large area of acute infarct right MCA distribution with associated edema and regional sulcal effacement but no midline shift or definite hemorrhage.  TTE with LVEF 25-30%, no LV thrombus identified, no intra-atrial shunt.  Hemoglobin A1c 5.9, LDL 109. -- Neurology following, appreciate assistance -- DAPT with aspirin 81 mg p.o. daily, Plavix 75 mg p.o. daily x 21 days followed by aspirin alone -- Atorvastatin 40 mg p.o. daily -- Monitor on telemetry -- Neurology requesting TEE to rule out LV thrombus, discussed with cardiology, n.p.o. after midnight for possible procedure tomorrow -- Continue PT/OT efforts while inpatient, pending CIR evaluation  Cardiomyopathy TTE with LVEF 25-30%, LV severely decreased function, LV with global hypokinesis, LV moderately dilated, severe LVH, no LV thrombus visualized, RV systolic function mildly reduced, biatrial enlargement, small pericardial effusion which is circumferential, moderate/severe MR, no aortic stenosis, IVC dilated.  History of chronic cocaine/tobacco use.  Denies chest pain. --Cardiology following, appreciate assistance --Carvedilol 3.125 mg p.o. twice daily --Losartan 12.5 mg p.o. daily --On aspirin/statin/Plavix   Essential hypertension Patient reports currently on losartan and amlodipine; intermittent use -- BP 141/121 this a.m., -- Started on carvedilol and losartan as above  Polysubstance abuse with history of tobacco, cocaine, THC UDS positive for cocaine, THC.  Counseled on need for complete abstinence/cessation. 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  Discussed with ID, Dr. Juleen China.  Recommend LP, but patient refused. -- T palladium antibodies: Pending -- Penicillin 12,000,000 units IV every 12 hours   DVT prophylaxis: enoxaparin (LOVENOX) injection 40 mg Start: 12/25/22 2200 SCD's  Start: 12/25/22 1814    Code Status: Full Code Family Communication:   Disposition Plan:   Level of care: Telemetry Medical Status is: Inpatient Remains inpatient appropriate because: IV antibiotics, pending CIR evaluation   Consultants:  Neurology Cardiology Infectious disease  Procedures:  TTE Antimicrobials:  IV penicillin 3/1>>   Subjective: Patient seen examined at bedside.  Lying in bed.  Sleeping but arousable.  Complaining of some shortness of breath, otherwise no other complaints. 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  Discussed with cardiology neurology's recommendation for TEE to rule out LV thrombus, possible procedure tomorrow.  Denies headache, no chest pain, no fever/chills/night sweats, no nausea/vomiting/diarrhea,, no abdominal pain.  No other acute concerns overnight per nursing staff.  Objective: Vitals:   12/27/22 2328 12/28/22 0329 12/28/22 0429 12/28/22 0818  BP: (!) 140/106 (!) 156/124 (!) 141/121 (!) 147/108  Pulse: (!) 105 (!) 117 (!) 108 (!) 56  Resp: '18 18 20 19  '$ Temp: 98.5 F (36.9 C) 98.9 F (37.2 C) 98.3 F (36.8 C) 97.8 F (36.6 C)  TempSrc: Oral  Oral Oral  SpO2: 96% 95% 95% 97%  Weight:      Height:        Intake/Output Summary (Last 24 hours) at 12/28/2022 0941 Last data filed at 12/27/2022 1800 Gross per 24 hour  Intake 375.3 ml  Output --  Net 375.3 ml   Filed Weights   12/25/22 1033  Weight: 74.8 kg    Examination:  Physical Exam: GEN: NAD, alert and oriented x 3, chronically ill in appearance, appears older than stated age HEENT: NCAT, PERRL, EOMI, sclera clear, MMM PULM: CTAB w/o wheezes/crackles, normal respiratory effort, on room air CV: RRR w/o M/G/R GI: abd soft, NTND, NABS, no R/G/M GU: No appreciable lesions noted to penis MSK: no peripheral edema, flaccid paralysis left upper extremity, otherwise moves all extremities independently NEURO: CN II-XII intact, sensation to light touch intact, flaccid paralysis L UE, otherwise muscle strength globally intact PSYCH: Depressed mood, flat  affect Integumentary: No rashes noted to abdomen, no other concerning lesions rashes/wounds noted to exposed skin surfaces    Data Reviewed: I have personally reviewed following labs and imaging studies  CBC: Recent Labs  Lab 12/25/22 1237 12/25/22 1329  WBC  --  5.6  NEUTROABS  --  3.4  HGB 12.6* 12.2*  HCT 37.0* 37.0*  MCV  --  87.9  PLT  --  99991111   Basic Metabolic Panel: Recent Labs  Lab 12/25/22 1237 12/25/22 1329  NA 142 141  K 3.7 3.9  CL 105 107  CO2  --  22  GLUCOSE 110* 110*  BUN 20 19  CREATININE 1.10 0.96  CALCIUM  --  8.8*   GFR: Estimated Creatinine Clearance: 106.1 mL/min (by C-G formula based on SCr of 0.96 mg/dL). Liver Function Tests: Recent Labs  Lab 12/25/22 1329  AST 31  ALT 22  ALKPHOS 50  BILITOT 0.5  PROT 6.6  ALBUMIN 2.8*   No results for input(s): "LIPASE", "AMYLASE" in the last 168 hours. No results for input(s): "AMMONIA" in the last 168 hours. Coagulation Profile: Recent Labs  Lab 12/25/22 1329  INR 1.1   Cardiac Enzymes: No results for input(s): "CKTOTAL", "CKMB", "CKMBINDEX", "TROPONINI" in the last 168 hours. BNP (last 3 results) No results for input(s): "PROBNP" in the last 8760 hours. HbA1C: Recent Labs    12/26/22 1217  HGBA1C 5.9*   CBG: No results for input(s): "GLUCAP" in the last 168 hours.  Lipid Profile: Recent Labs    12/26/22 1217  CHOL 164  HDL 39*  LDLCALC 109*  TRIG 82  CHOLHDL 4.2   Thyroid Function Tests: No results for input(s): "TSH", "T4TOTAL", "FREET4", "T3FREE", "THYROIDAB" in the last 72 hours. Anemia Panel: No results for input(s): "VITAMINB12", "FOLATE", "FERRITIN", "TIBC", "IRON", "RETICCTPCT" in the last 72 hours. Sepsis Labs: No results for input(s): "PROCALCITON", "LATICACIDVEN" in the last 168 hours.  No results found for this or any previous visit (from the past 240 hour(s)).       Radiology Studies: ECHOCARDIOGRAM COMPLETE  Result Date: 12/26/2022    ECHOCARDIOGRAM  REPORT   Patient Name:   WEI UEHARA Date of Exam: 12/26/2022 Medical Rec #:  [id number]       Height:       72.0 in Accession #:    [id number]      Weight:       164.9 lb Date of Birth:  1981-05-07      BSA:          1.963 m Patient Age:    28 years        BP:           124/85 mmHg Patient Gender: M               HR:           100 bpm. Exam Location:  Inpatient Procedure: 2D Echo, Cardiac Doppler and Color Doppler Indications:    Stroke I63.9  History:        Patient has no prior history of Echocardiogram examinations.                 Stroke; Risk Factors:Hypertension and Current Smoker.  Sonographer:    Ronny Flurry Referring Phys: [id number] Thaniel Coluccio J [place] IMPRESSIONS  1. Left ventricular ejection fraction, by estimation, is 25 to 30%. The left ventricle has severely decreased function. The left ventricle demonstrates global hypokinesis. The left ventricular internal cavity size was moderately dilated. There is severe  left ventricular hypertrophy of the inferior segment. Left ventricular diastolic function could not be evaluated. There is no LV thrombus visualized.  2. Right ventricular systolic function mildly reduced with normal basal excursion. The right ventricular size is mild to moderately. Tricuspid regurgitation signal is inadequate for assessing PA pressure.  3. Left atrial size was mildly dilated.  4. Right atrial size was mildly dilated.  5. A small pericardial effusion is present. The pericardial effusion is circumferential.  6. MR mechanism appears ventricular functional in nature, with blunting of the right sided pulmonary vein Doppler. The mitral valve is normal in structure. Moderate to severe mitral valve regurgitation.  7. The aortic valve is tricuspid. Aortic valve regurgitation is not visualized. No aortic stenosis is present.  8. The inferior vena cava is dilated in size with <50% respiratory variability, suggesting right atrial pressure of 15 mmHg. FINDINGS  Left Ventricle: Left  ventricular ejection fraction, by estimation, is 25 to 30%. The left ventricle has severely decreased function. The left ventricle demonstrates global hypokinesis. The left ventricular internal cavity size was moderately dilated. There is severe left ventricular hypertrophy of the inferior segment. Left ventricular diastolic function could not be evaluated due to mitral regurgitation (moderate or greater). Left ventricular diastolic function could not be evaluated. Right Ventricle: The right ventricular size is mild to moderately. No increase in right ventricular wall thickness. Right ventricular systolic function mildly reduced with normal basal excursion. Tricuspid regurgitation signal is inadequate  for assessing  PA pressure. Left Atrium: Left atrial size was mildly dilated. Right Atrium: Right atrial size was mildly dilated. Pericardium: A small pericardial effusion is present. The pericardial effusion is circumferential. Mitral Valve: MR mechanism appears ventricular functional in nature, with blunting of the right sided pulmonary vein Doppler. The mitral valve is normal in structure. Moderate to severe mitral valve regurgitation. Tricuspid Valve: The tricuspid valve is normal in structure. Tricuspid valve regurgitation is not demonstrated. No evidence of tricuspid stenosis. Aortic Valve: The aortic valve is tricuspid. Aortic valve regurgitation is not visualized. No aortic stenosis is present. Aortic valve mean gradient measures 4.0 mmHg. Aortic valve peak gradient measures 7.1 mmHg. Aortic valve area, by VTI measures 2.40 cm. Pulmonic Valve: The pulmonic valve was normal in structure. Pulmonic valve regurgitation is not visualized. No evidence of pulmonic stenosis. Aorta: The aortic root and ascending aorta are structurally normal, with no evidence of dilitation. Venous: The inferior vena cava is dilated in size with less than 50% respiratory variability, suggesting right atrial pressure of 15 mmHg.  IAS/Shunts: No atrial level shunt detected by color flow Doppler.  LEFT VENTRICLE PLAX 2D LVIDd:         6.10 cm   Diastology LVIDs:         5.40 cm   LV e' medial:    6.20 cm/s LV PW:         1.50 cm   LV E/e' medial:  22.7 LV IVS:        1.10 cm   LV e' lateral:   10.10 cm/s LVOT diam:     2.20 cm   LV E/e' lateral: 14.0 LV SV:         54 LV SV Index:   27 LVOT Area:     3.80 cm  RIGHT VENTRICLE             IVC RV S prime:     15.30 cm/s  IVC diam: 2.80 cm TAPSE (M-mode): 1.6 cm LEFT ATRIUM             Index        RIGHT ATRIUM           Index LA diam:        4.30 cm 2.19 cm/m   RA Area:     20.20 cm LA Vol (A2C):   68.4 ml 34.85 ml/m  RA Volume:   62.30 ml  31.74 ml/m LA Vol (A4C):   73.3 ml 37.34 ml/m LA Biplane Vol: 70.8 ml 36.07 ml/m  AORTIC VALVE AV Area (Vmax):    2.88 cm AV Area (Vmean):   2.62 cm AV Area (VTI):     2.40 cm AV Vmax:           133.00 cm/s AV Vmean:          95.300 cm/s AV VTI:            0.223 m AV Peak Grad:      7.1 mmHg AV Mean Grad:      4.0 mmHg LVOT Vmax:         100.60 cm/s LVOT Vmean:        65.600 cm/s LVOT VTI:          0.141 m LVOT/AV VTI ratio: 0.63  AORTA Ao Root diam: 3.70 cm Ao Asc diam:  3.05 cm MV E velocity: 141.00 cm/s  SHUNTS                             Systemic VTI:  0.14 m                             Systemic Diam: 2.20 cm Rudean Haskell MD Electronically signed by Rudean Haskell MD Signature Date/Time: 12/26/2022/4:27:48 PM    Final         Scheduled Meds:  aspirin EC  81 mg Oral Daily   atorvastatin  40 mg Oral Daily   carvedilol  3.125 mg Oral BID WC   clopidogrel  75 mg Oral Daily   enoxaparin (LOVENOX) injection  40 mg Subcutaneous Q24H   losartan  12.5 mg Oral Daily   nicotine  21 mg Transdermal Daily   Continuous Infusions:  penicillin G potassium 12 Million Units in dextrose 5 % 500 mL CONTINUOUS infusion 12 Million Units (12/27/22 2352)     LOS: 2 days    Time spent: 51 minutes spent on chart  review, discussion with nursing staff, consultants, updating family and interview/physical exam; more than 50% of that time was spent in counseling and/or coordination of care.    Yaslin Kirtley J [place] DO [facility] Available via Epic secure chat 7am-7pm After these hours, please refer to coverage provider listed on [email or web address] 12/28/2022, 9:41 AM

## 2022-12-28 NOTE — Progress Notes (Addendum)
STROKE TEAM PROGRESS NOTE   INTERVAL HISTORY No family at bedside.   Patient sleeping, but awakes to voice/oriented.  Left-sided weakness continues with decreased sensation on the left    Vitals:   12/28/22 0329 12/28/22 0429 12/28/22 0818 12/28/22 1136  BP: (!) 156/124 (!) 141/121 (!) 147/108 (!) 149/104  Pulse: (!) 117 (!) 108 (!) 56 (!) 115  Resp: '18 20 19 18  '$ Temp: 98.9 F (37.2 C) 98.3 F (36.8 C) 97.8 F (36.6 C) 98.8 F (37.1 C)  TempSrc:  Oral Oral Oral  SpO2: 95% 95% 97% 100%  Weight:      Height:       CBC:  Recent Labs  Lab 12/25/22 1237 12/25/22 1329  WBC  --  5.6  NEUTROABS  --  3.4  HGB 12.6* 12.2*  HCT 37.0* 37.0*  MCV  --  87.9  PLT  --  99991111    Basic Metabolic Panel:  Recent Labs  Lab 12/25/22 1237 12/25/22 1329  NA 142 141  K 3.7 3.9  CL 105 107  CO2  --  22  GLUCOSE 110* 110*  BUN 20 19  CREATININE 1.10 0.96  CALCIUM  --  8.8*    Lipid Panel:  Recent Labs  Lab 12/26/22 1217  CHOL 164  TRIG 82  HDL 39*  CHOLHDL 4.2  VLDL 16  LDLCALC 109*    HgbA1c:  Recent Labs  Lab 12/26/22 1217  HGBA1C 5.9*    Urine Drug Screen:  Recent Labs  Lab 12/25/22 2014  LABOPIA NONE DETECTED  COCAINSCRNUR POSITIVE*  LABBENZ NONE DETECTED  AMPHETMU NONE DETECTED  THCU POSITIVE*  LABBARB NONE DETECTED     Alcohol Level  Recent Labs  Lab 12/25/22 1328  ETH <10     IMAGING past 24 hours No results found.  PHYSICAL EXAM  Temp:  [97.8 F (36.6 C)-99.4 F (37.4 C)] 98.8 F (37.1 C) (03/03 1136) Pulse Rate:  [56-120] 115 (03/03 1136) Resp:  [17-26] 18 (03/03 1136) BP: (129-156)/(94-124) 149/104 (03/03 1136) SpO2:  [95 %-100 %] 100 % (03/03 1136)  General -  Appears poorly nourished,  in no apparent distress. Cardiovascular - Regular rhythm and rate. Pulmonary -  unlabored breathing, no supplemental oxygen.   Mental Status -  Drowsy, flat affect, orientation to time, place, and person were intact. Language including  expression, naming, repetition, comprehension was assessed and found intact.  Cranial Nerves II - XII - II - Visual field intact OU. III, IV, VI - Extraocular movements intact. V - Facial sensation intact bilaterally. VII - Slight L facial droop  VIII - Hearing & vestibular intact bilaterally. X - Palate elevates symmetrically, no hoarseness. XI - Chin turning & shoulder shrug intact bilaterally. XII - Tongue protrusion intact.  Motor Strength - Left hemiplegia with drift seen in left arm and leg, increased drift in left arm 3/5 proximal and distal.  3/5 left grip, 2/5 fine finger movements. Bulk was normal and fasciculations were absent.   Motor Tone - Muscle tone was assessed at the neck and appendages and was normal.  Sensory - Light touch assessed, decreased on left face, LUE, LLE.   Coordination - The patient had normal movements in the hands and feet with no ataxia or dysmetria on right side, unable to assess left.  Tremor was absent.  Gait and Station - deferred.   ASSESSMENT/PLAN Jeremy Fritz is a 42 y.o. male with history significant for hypertension, polysubstance abuse including tobacco/THC/cocaine who  presents with one week of L-sided weakness and found to have a R MCA infarct on CT head/MRI.   Acute Ischemic R MCA Stroke, likely due to large vessel disease with uncontrolled risk factors, versus cardiomyopathy with low EF, versus small right carotid web, versus infectious vasculitis from neurosyphilis CT head Acute or subacute right posterior MCA territory infarct. Regional mass effect with slight leftward midline shift. Probable hyperdense vessel within a right M2 MCA branch, suspicious for thrombus. CTA head & neck: Acute proximal stump right M3 occlusion. Subtle transversely oriented shelf-like defect at the origin of the cervical right ICA, suspicious for a small carotid web. No significant stenosis MRI: Moderate-to-large area of acute infarct in the right MCA  distribution with associated edema and regional sulcal effacement but no midline shift or definite hemorrhage 2D Echo w/bubble: EF 25 to 30%, global hypokinesis, no LV thrombus Recommend TEE to evaluate for LV thrombus, pending scheduling LDL 109 HgbA1c 5.9 UDS + cocaine and THC VTE prophylaxis -Lovenox No antithrombotic prior to admission, now on ASA and Plavix daily. Recommend aspirin and plavix for 3 months, then ASA alone given intracranial occlusion.  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 Patient advised to stop using due to stroke risk.  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 Note/plan to be edited by MD as needed.    Otelia Santee, DNP, AGACNP-BC [facility] Please use AMION for pager and EPIC for messaging  ATTENDING NOTE: I reviewed above note and agree with the assessment and plan. Pt was seen and examined.   Pt was sleeping during round, easily arousable and fully orientated. Neuro stable, unchanged, pending TEE next week. Continue DAPT and statin.  PT/OT recommend CIR.  For detailed assessment and plan, please refer to above/below as I have made changes wherever appropriate.   Rosalin Hawking, MD PhD Stroke Neurology 12/28/2022 3:56 PM     To contact Stroke Continuity provider, please refer to [email or web address] After hours, contact General Neurology

## 2022-12-29 DIAGNOSIS — F1410 Cocaine abuse, uncomplicated: Secondary | ICD-10-CM | POA: Diagnosis not present

## 2022-12-29 DIAGNOSIS — A523 Neurosyphilis, unspecified: Secondary | ICD-10-CM | POA: Diagnosis not present

## 2022-12-29 DIAGNOSIS — F17200 Nicotine dependence, unspecified, uncomplicated: Secondary | ICD-10-CM

## 2022-12-29 DIAGNOSIS — I63511 Cerebral infarction due to unspecified occlusion or stenosis of right middle cerebral artery: Secondary | ICD-10-CM | POA: Diagnosis not present

## 2022-12-29 DIAGNOSIS — I255 Ischemic cardiomyopathy: Secondary | ICD-10-CM | POA: Diagnosis not present

## 2022-12-29 LAB — CBC
HCT: 37.7 % — ABNORMAL LOW (ref 39.0–52.0)
Hemoglobin: 13.2 g/dL (ref 13.0–17.0)
MCH: 29.7 pg (ref 26.0–34.0)
MCHC: 35 g/dL (ref 30.0–36.0)
MCV: 84.7 fL (ref 80.0–100.0)
Platelets: 297 10*3/uL (ref 150–400)
RBC: 4.45 MIL/uL (ref 4.22–5.81)
RDW: 15.1 % (ref 11.5–15.5)
WBC: 8.6 10*3/uL (ref 4.0–10.5)
nRBC: 0 % (ref 0.0–0.2)

## 2022-12-29 LAB — BASIC METABOLIC PANEL
Anion gap: 6 (ref 5–15)
BUN: 15 mg/dL (ref 6–20)
CO2: 25 mmol/L (ref 22–32)
Calcium: 8.5 mg/dL — ABNORMAL LOW (ref 8.9–10.3)
Chloride: 106 mmol/L (ref 98–111)
Creatinine, Ser: 1.07 mg/dL (ref 0.61–1.24)
GFR, Estimated: >60 mL/min (ref >60)
Glucose, Bld: 121 mg/dL — ABNORMAL HIGH (ref 70–99)
Potassium: 3.9 mmol/L (ref 3.5–5.1)
Sodium: 137 mmol/L (ref 135–145)

## 2022-12-29 LAB — PROTIME-INR
INR: 1.1 (ref 0.8–1.2)
Prothrombin Time: 14.3 seconds (ref 11.4–15.2)

## 2022-12-29 LAB — MAGNESIUM: Magnesium: 2 mg/dL (ref 1.7–2.4)

## 2022-12-29 LAB — T.PALLIDUM AB, TOTAL: T Pallidum Abs: REACTIVE — AB

## 2022-12-29 NOTE — Progress Notes (Signed)
Inpatient Rehab Admissions Coordinator:   CIR following. I confirmed with Pt.'s mother Dvid Bedi that Pt. Will d/c home with her and that she can provide 24/7 supervision to min assist upon d/c. I will send case to insurance once work up is complete (TEE remains pending).   Clemens Catholic, [place], [place] Admissions Coordinator  [phone] ([place]) [phone] (office)

## 2022-12-29 NOTE — Progress Notes (Incomplete)
   Heart Failure Stewardship Pharmacist Progress Note   PCP: Patient, No Pcp Per PCP-Cardiologist: Shelva Majestic, MD    HPI:  ***  Current HF Medications: {HF MEDS CURRENT:26665}  Prior to admission HF Medications: {HF MEDS PTA:26664}  Pertinent Lab Values: Serum creatinine ***, BUN ***, Potassium ***, Sodium ***, BNP ***, Magnesium ***, A1c ***, Digoxin ***   Vital Signs: Weight: *** lbs (admission weight: *** lbs) Blood pressure: ***  Heart rate: ***  I/O: -***L yesterday; net -***L  Medication Assistance / Insurance Benefits Check: Does the patient have prescription insurance?  {Yes/No/Pending:24180} Type of insurance plan: ***  Does the patient qualify for medication assistance through manufacturers or grants?   {Yes/No/Pending:24180} Eligible grants and/or patient assistance programs: *** Medication assistance applications in progress: ***  Medication assistance applications approved: *** Approved medication assistance renewals will be completed by: ***  Outpatient Pharmacy:  Prior to admission outpatient pharmacy: *** Is the patient willing to use [facility] at discharge? {Yes/No/Pending:24180} Is the patient willing to transition their outpatient pharmacy to utilize a [facility] outpatient pharmacy?   {Yes/No/Pending:24180}    Assessment: 1. Acute on ***chronic ***systolic CHF (LVEF ***%), due to ***. NYHA class *** symptoms. -    Plan: 1) Medication changes recommended at this time: -  2) Patient assistance: -  3)  Education  - To be completed prior to discharge  Kerby Nora, PharmD, BCPS Heart Failure Stewardship Pharmacist Phone [phone]

## 2022-12-29 NOTE — Plan of Care (Signed)

## 2022-12-29 NOTE — Progress Notes (Addendum)
Physical Therapy Treatment Patient Details Name: Jeremy Fritz DOB: 11-Jun-1981 Today's Date: 12/29/2022   History of Present Illness Pt is a 42 y/o male presenting on 2/29 with 1 week of L facial droop and UE weakness.  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 MRI with moderate-to-large area of acute infarct in the right MCA distribution. PMH includes: polysubstance abuse.    PT Comments    Pt progressing steadily towards his physical therapy goals; demonstrates decreased impulsivity this session and improving awareness/attention. Session focused on progressive ambulation, promoting L side awareness, L sided weightbearing and strengthening exercises, and dynamic balance. Pt requiring min assist overall for functional mobility. Continues to demonstrate L hemiplegia (L distal UE weaker than LLE), impaired balance, cognitive deficits, gait abnormalities. Based on age, motivation, family support, PLOF, suspect excellent progress. Continue to recommend acute inpatient rehab (AIR) for post-acute therapy needs.    Recommendations for follow up therapy are one component of a multi-disciplinary discharge planning process, led by the attending physician.  Recommendations may be updated based on patient status, additional functional criteria and insurance authorization.  Follow Up Recommendations  Acute inpatient rehab (3hours/day)     Assistance Recommended at Discharge Frequent or constant Supervision/Assistance  Patient can return home with the following Assistance with cooking/housework;Direct supervision/assist for medications management;Direct supervision/assist for financial management;Help with stairs or ramp for entrance;Assist for transportation;A little help with walking and/or transfers;A little help with bathing/dressing/bathroom   Equipment Recommendations  None recommended by PT    Recommendations  for Other Services       Precautions / Restrictions Precautions Precautions: Fall Precaution Comments: L inattention Restrictions Weight Bearing Restrictions: No     Mobility  Bed Mobility               General bed mobility comments: EOB upon entry    Transfers Overall transfer level: Needs assistance Equipment used: None Transfers: Sit to/from Stand Sit to Stand: Min assist           General transfer comment: to steady with cueing for L sided awareness    Ambulation/Gait Ambulation/Gait assistance: Min assist Gait Distance (Feet): 200 Feet (200", 50") Assistive device: None Gait Pattern/deviations: Step-through pattern, Decreased step length - left, Drifts right/left Gait velocity: decreased     General Gait Details: Circumduction with LLE in swing phase, tendency for left drift with mod multimodal cues and frequent pauses for increased L attention and awareness. pt running into 2 obstacles. MinA for dynamic balance   Stairs             Wheelchair Mobility    Modified Rankin (Stroke Patients Only) Modified Rankin (Stroke Patients Only) Pre-Morbid Rankin Score: No symptoms Modified Rankin: Moderately severe disability     Balance Overall balance assessment: Needs assistance Sitting-balance support: No upper extremity supported, Feet supported Sitting balance-Leahy Scale: Good     Standing balance support: During functional activity, No upper extremity supported Standing balance-Leahy Scale: Fair                              Cognition Arousal/Alertness: Awake/alert Behavior During Therapy: Flat affect, Impulsive Overall Cognitive Status: Impaired/Different from baseline Area of Impairment: Attention, Memory, Following commands, Safety/judgement, Awareness, Problem solving                   Current Attention Level: Selective Memory: Decreased recall of  precautions, Decreased short-term memory Following Commands:  Follows one step commands consistently, Follows one step commands with increased time, Follows multi-step commands inconsistently Safety/Judgement: Decreased awareness of safety, Decreased awareness of deficits Awareness: Emergent Problem Solving: Slow processing, Decreased initiation, Difficulty sequencing, Requires verbal cues, Requires tactile cues General Comments: pt with increased awareness and safety today, continues to requires cueing to slow down and attend to L side        Exercises General Exercises - Lower Extremity Long Arc Quad: Both, 10 reps, Seated Hip ABduction/ADduction: Both, 10 reps, Standing Hip Flexion/Marching: Both, 10 reps, Seated Heel Raises: Both, 10 reps, Standing Other Exercises Other Exercises: Standing: bilateral hamstring curls x 10, squats x 10 Other Exercises: Standing: functional reaching with LUE with visual targets, modified wall push ups x 10    General Comments        Pertinent Vitals/Pain Pain Assessment Pain Assessment: Faces Faces Pain Scale: No hurt    Home Living                          Prior Function            PT Goals (current goals can now be found in the care plan section) Acute Rehab PT Goals Potential to Achieve Goals: Good Progress towards PT goals: Progressing toward goals    Frequency    Min 4X/week      PT Plan Current plan remains appropriate    Co-evaluation              AM-PAC PT "6 Clicks" Mobility   Outcome Measure  Help needed turning from your back to your side while in a flat bed without using bedrails?: None Help needed moving from lying on your back to sitting on the side of a flat bed without using bedrails?: None Help needed moving to and from a bed to a chair (including a wheelchair)?: A Little Help needed standing up from a chair using your arms (e.g., wheelchair or bedside chair)?: A Little Help needed to walk in hospital room?: A Little Help needed climbing 3-5 steps with  a railing? : A Little 6 Click Score: 20    End of Session Equipment Utilized During Treatment: Gait belt Activity Tolerance: Patient tolerated treatment well Patient left: in bed;with call bell/phone within reach;Other (comment) (with OT) Nurse Communication: Mobility status PT Visit Diagnosis: Unsteadiness on feet (R26.81);Other abnormalities of gait and mobility (R26.89);Muscle weakness (generalized) (M62.81);Difficulty in walking, not elsewhere classified (R26.2);Other symptoms and signs involving the nervous system (R29.898);Hemiplegia and hemiparesis Hemiplegia - Right/Left: Left Hemiplegia - dominant/non-dominant: Non-dominant Hemiplegia - caused by: Cerebral infarction     Time: 0939-1000 PT Time Calculation (min) (ACUTE ONLY): 21 min  Charges:  $Therapeutic Activity: 8-22 mins                     Wyona Almas, PT, DPT Acute Rehabilitation Services Office [phone]    Jeremy Fritz 12/29/2022, 11:11 AM

## 2022-12-29 NOTE — Progress Notes (Addendum)
Occupational Therapy Treatment Patient Details Name: Jeremy Fritz MRN: [id number] DOB: 11/16/1980 Today's Date: 12/29/2022   History of present illness Pt is a 42 y/o male presenting on 2/29 with 1 week of L facial droop and UE weakness.  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 MRI with moderate-to-large area of acute infarct in the right MCA distribution. PMH includes: polysubstance abuse.   OT comments  Patient EOB upon entry with PT present.  Worked on L UE strength and coordination, see below for details on exercises.  Transfers with min guard to min assist to steady, cueing for L sided awareness.  Functional mobility to sink with functional reaching and grasp with L UE during grooming tasks, continues to require min assist to attend to L side.  Issued squeeze ball and encouraged pt to use L UE functionally as stabilizer during ADLs. Patient highly motivated and continue to recommend AIR to optimize independence and safety.    Recommendations for follow up therapy are one component of a multi-disciplinary discharge planning process, led by the attending physician.  Recommendations may be updated based on patient status, additional functional criteria and insurance authorization.    Follow Up Recommendations  Acute inpatient rehab (3hours/day)     Assistance Recommended at Discharge Frequent or constant Supervision/Assistance  Patient can return home with the following  A little help with bathing/dressing/bathroom;Assistance with cooking/housework;Direct supervision/assist for medications management;Direct supervision/assist for financial management;Assist for transportation;Help with stairs or ramp for entrance;A little help with walking and/or transfers   Equipment Recommendations  Other (comment) (defer)    Recommendations for Other Services Rehab consult    Precautions / Restrictions  Precautions Precautions: Fall Precaution Comments: L inattention Restrictions Weight Bearing Restrictions: No       Mobility Bed Mobility               General bed mobility comments: EOB upon entry    Transfers Overall transfer level: Needs assistance Equipment used: None Transfers: Sit to/from Stand Sit to Stand: Min assist           General transfer comment: to steady with cueing for L sided awareness     Balance Overall balance assessment: Needs assistance Sitting-balance support: No upper extremity supported, Feet supported Sitting balance-Leahy Scale: Fair     Standing balance support: During functional activity, Single extremity supported, No upper extremity supported Standing balance-Leahy Scale: Poor Standing balance comment: reaching for R UE support,                           ADL either performed or assessed with clinical judgement   ADL Overall ADL's : Needs assistance/impaired   Eating/Feeding Details (indicate cue type and reason): cueing to use L hand as stabilizer to open containers Grooming: Minimal assistance;Wash/dry hands;Standing Grooming Details (indicate cue type and reason): cueing to use L hand, functional reaching crossbody; grasping trash with L hand to throw away                 Toilet Transfer: Minimal assistance;Ambulation Toilet Transfer Details (indicate cue type and reason): simulated in room         Functional mobility during ADLs: Minimal assistance;Cueing for safety General ADL Comments: cueing to attend to L side, avoid obstacles    Extremity/Trunk Assessment              Vision  Perception     Praxis      Cognition Arousal/Alertness: Awake/alert Behavior During Therapy: Flat affect, Impulsive Overall Cognitive Status: Impaired/Different from baseline Area of Impairment: Attention, Memory, Following commands, Safety/judgement, Awareness, Problem solving                    Current Attention Level: Sustained Memory: Decreased recall of precautions, Decreased short-term memory Following Commands: Follows one step commands consistently, Follows one step commands with increased time, Follows multi-step commands inconsistently Safety/Judgement: Decreased awareness of safety, Decreased awareness of deficits Awareness: Emergent Problem Solving: Slow processing, Decreased initiation, Difficulty sequencing, Requires verbal cues, Requires tactile cues General Comments: pt with increased awareness and safety today, continues to requires cueing to slow down and attend to L side        Exercises Exercises: Other exercises Other Exercises Other Exercises: L UE exercises x 5-8 reps of: shoulder elevation, retraction, flexion, elbow flexion/extension, supination/pronation, wrist extension/flexion and grasp/extension.  Cueing for full ROM and controlled movement.    Shoulder Instructions       General Comments      Pertinent Vitals/ Pain       Pain Assessment Pain Assessment: Faces Faces Pain Scale: No hurt  Home Living                                          Prior Functioning/Environment              Frequency  Min 2X/week        Progress Toward Goals  OT Goals(current goals can now be found in the care plan section)  Progress towards OT goals: Progressing toward goals  Acute Rehab OT Goals Patient Stated Goal: use my L arm Time For Goal Achievement: 01/09/23 Potential to Achieve Goals: [place] Discharge plan remains appropriate;Frequency remains appropriate    Co-evaluation                 AM-PAC OT "6 Clicks" Daily Activity     Outcome Measure   Help from another person eating meals?: A Little Help from another person taking care of personal grooming?: A Little Help from another person toileting, which includes using toliet, bedpan, or urinal?: A Little Help from another person bathing (including washing,  rinsing, drying)?: A Little Help from another person to put on and taking off regular upper body clothing?: A Little Help from another person to put on and taking off regular lower body clothing?: A Little 6 Click Score: 18    End of Session    OT Visit Diagnosis: Other abnormalities of gait and mobility (R26.89);Hemiplegia and hemiparesis;Other symptoms and signs involving cognitive function;History of falling (Z91.81) Hemiplegia - Right/Left: Left Hemiplegia - dominant/non-dominant: Non-Dominant Hemiplegia - caused by: Cerebral infarction   Activity Tolerance Patient tolerated treatment well   Patient Left with call bell/phone within reach;with bed alarm set;Other (comment) (sitting EOB)   Nurse Communication Mobility status        Time: [id number] OT Time Calculation (min): 19 min  Charges: OT General Charges $OT Visit: 1 Visit OT Treatments $Neuromuscular Re-education: 8-22 mins  [facility] [place] 12/29/2022, 11:54 AM

## 2022-12-29 NOTE — Progress Notes (Signed)
Progress Note  Patient Name: Jeremy Fritz Date of Encounter: 12/29/2022  Primary Cardiologist: Shelva Majestic, MD   Subjective   Patient seen examined at his bedside.  Family members at the bedside.  He offers no particular complaints at this time.  Telemetry reviewed patient in sinus rhythm.  Inpatient Medications    Scheduled Meds:  aspirin EC  81 mg Oral Daily   atorvastatin  40 mg Oral Daily   carvedilol  3.125 mg Oral BID WC   clopidogrel  75 mg Oral Daily   empagliflozin  10 mg Oral Daily   enoxaparin (LOVENOX) injection  40 mg Subcutaneous Q24H   losartan  12.5 mg Oral Daily   nicotine  21 mg Transdermal Daily   Continuous Infusions:  sodium chloride     penicillin G potassium 12 Million Units in dextrose 5 % 500 mL CONTINUOUS infusion 12 Million Units (12/29/22 0033)   PRN Meds: acetaminophen **OR** acetaminophen (TYLENOL) oral liquid 160 mg/5 mL **OR** acetaminophen, levalbuterol, senna-docusate, traZODone   Vital Signs    Vitals:   12/28/22 1549 12/28/22 2112 12/29/22 0040 12/29/22 0413  BP: (!) 132/107 (!) 121/90 (!) 130/97 (!) 129/90  Pulse: (!) 104 (!) 109 97 90  Resp: '18 18 18 16  '$ Temp: 97.9 F (36.6 C) 99.1 F (37.3 C) 98.6 F (37 C) 97.7 F (36.5 C)  TempSrc: Oral Oral Oral Oral  SpO2: 97% 95% 100% 97%  Weight:      Height:        Intake/Output Summary (Last 24 hours) at 12/29/2022 0941 Last data filed at 12/29/2022 0400 Gross per 24 hour  Intake --  Output 500 ml  Net -500 ml   Filed Weights   12/25/22 1033  Weight: 74.8 kg    Telemetry    This rhythm.- Personally Reviewed  ECG    None today- Personally Reviewed  Physical Exam    General: Comfortable, lying in bed. Head: Atraumatic, normal size  Eyes: PEERLA, EOMI  Neck: Supple, normal JVD Cardiac: Normal S1, S2; RRR; no murmurs, rubs, or gallops Lungs: Clear to auscultation bilaterally Abd: Soft, nontender, no hepatomegaly  Ext: warm, no edema Musculoskeletal: No  deformities, did not perform full neurological exam. Skin: Warm and dry, no rashes, tattoos Neuro: Alert and oriented to person, place, time, and situation, did not perform full neurological exam. Psych: Normal mood and affect   Labs    Chemistry Recent Labs  Lab 12/25/22 1237 12/25/22 1329 12/29/22 0456  NA 142 141 137  K 3.7 3.9 3.9  CL 105 107 106  CO2  --  22 25  GLUCOSE 110* 110* 121*  BUN '20 19 15  '$ CREATININE 1.10 0.96 1.07  CALCIUM  --  8.8* 8.5*  PROT  --  6.6  --   ALBUMIN  --  2.8*  --   AST  --  31  --   ALT  --  22  --   ALKPHOS  --  50  --   BILITOT  --  0.5  --   GFRNONAA  --  >60 >60  ANIONGAP  --  12 6     Hematology Recent Labs  Lab 12/25/22 1237 12/25/22 1329 12/29/22 0456  WBC  --  5.6 8.6  RBC  --  4.21* 4.45  HGB 12.6* 12.2* 13.2  HCT 37.0* 37.0* 37.7*  MCV  --  87.9 84.7  MCH  --  29.0 29.7  MCHC  --  33.0 35.0  RDW  --  15.3 15.1  PLT  --  267 297    Cardiac EnzymesNo results for input(s): "TROPONINI" in the last 168 hours. No results for input(s): "TROPIPOC" in the last 168 hours.   BNP Recent Labs  Lab 12/28/22 1417  BNP 376.4*     DDimer No results for input(s): "DDIMER" in the last 168 hours.   Radiology    No results found.  Cardiac Studies   TTE 12/26/2022 IMPRESSIONS     1. Left ventricular ejection fraction, by estimation, is 25 to 30%. The  left ventricle has severely decreased function. The left ventricle  demonstrates global hypokinesis. The left ventricular internal cavity size  was moderately dilated. There is severe   left ventricular hypertrophy of the inferior segment. Left ventricular  diastolic function could not be evaluated. There is no LV thrombus  visualized.   2. Right ventricular systolic function mildly reduced with normal basal  excursion. The right ventricular size is mild to moderately. Tricuspid  regurgitation signal is inadequate for assessing PA pressure.   3. Left atrial size was mildly  dilated.   4. Right atrial size was mildly dilated.   5. A small pericardial effusion is present. The pericardial effusion is  circumferential.   6. MR mechanism appears ventricular functional in nature, with blunting  of the right sided pulmonary vein Doppler. The mitral valve is normal in  structure. Moderate to severe mitral valve regurgitation.   7. The aortic valve is tricuspid. Aortic valve regurgitation is not  visualized. No aortic stenosis is present.   8. The inferior vena cava is dilated in size with <50% respiratory  variability, suggesting right atrial pressure of 15 mmHg.   FINDINGS   Left Ventricle: Left ventricular ejection fraction, by estimation, is 25  to 30%. The left ventricle has severely decreased function. The left  ventricle demonstrates global hypokinesis. The left ventricular internal  cavity size was moderately dilated.  There is severe left ventricular hypertrophy of the inferior segment. Left  ventricular diastolic function could not be evaluated due to mitral  regurgitation (moderate or greater). Left ventricular diastolic function  could not be evaluated.   Right Ventricle: The right ventricular size is mild to moderately. No  increase in right ventricular wall thickness. Right ventricular systolic  function mildly reduced with normal basal excursion. Tricuspid  regurgitation signal is inadequate for assessing   PA pressure.   Left Atrium: Left atrial size was mildly dilated.   Right Atrium: Right atrial size was mildly dilated.   Pericardium: A small pericardial effusion is present. The pericardial  effusion is circumferential.   Mitral Valve: MR mechanism appears ventricular functional in nature, with  blunting of the right sided pulmonary vein Doppler. The mitral valve is  normal in structure. Moderate to severe mitral valve regurgitation.   Tricuspid Valve: The tricuspid valve is normal in structure. Tricuspid  valve regurgitation is not  demonstrated. No evidence of tricuspid  stenosis.   Aortic Valve: The aortic valve is tricuspid. Aortic valve regurgitation is  not visualized. No aortic stenosis is present. Aortic valve mean gradient  measures 4.0 mmHg. Aortic valve peak gradient measures 7.1 mmHg. Aortic  valve area, by VTI measures 2.40  cm.   Pulmonic Valve: The pulmonic valve was normal in structure. Pulmonic valve  regurgitation is not visualized. No evidence of pulmonic stenosis.   Aorta: The aortic root and ascending aorta are structurally normal, with  no evidence of dilitation.   Venous: The  inferior vena cava is dilated in size with less than 50%  respiratory variability, suggesting right atrial pressure of 15 mmHg.   IAS/Shunts: No atrial level shunt detected by color flow Doppler.     Patient Profile     42 y.o. male with polysubstance abuse and acute ischemic right MCA stroke and newly diagnosed severe left ventricular systolic function EF 20 to 25%.  Assessment & Plan    Acute ischemic right MCA stroke ([place]) Cocaine abuse ([place]) Severely depressed ejection fraction EF 25 to 30%. Moderate to severe mitral regurgitation Obstructive sleep apnea. 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  He is on carvedilol, losartan we will plan to transition to [facility] prior to discharge, Jardiance 10 mg daily.  In terms of his moderate to severe mitral regurgitation suspect secondary in the setting of his severe LV dysfunction.  Will continue to monitor.  Will reassess this once his echo has been repeated in the outpatient setting.  He is being followed by neuro as well for his acute right MCA stroke which has led to left-sided weakness.  Plan for TEE tomorrow.  Patient is aware of this.  Shared  Decision Making/Informed Consent The risks [esophageal damage, perforation (1:10,000 risk), bleeding, pharyngeal hematoma as well as other potential complications associated with conscious sedation including aspiration, arrhythmia, respiratory failure and death], benefits (treatment guidance and diagnostic support) and alternatives of a transesophageal echocardiogram were discussed in detail with Mr. Juhasz and he is willing to proceed.    His blood pressure is acceptable today.  Will continue the current regimen and adjust as appropriate.  OSA-will need CPAP.  Polysubstance abuse will need to be evaluated by the case management but will defer to the primary team for next steps.      For questions or updates, please contact [place] Please consult [email or web address] for contact info under Cardiology/STEMI.      Rolly Pancake, DO  12/29/2022, 9:41 AM

## 2022-12-29 NOTE — Progress Notes (Signed)
PROGRESS NOTE    Jeremy Fritz  [id number] DOB: 01/14/1981 DOA: 12/25/2022 PCP: Patient, No Pcp Per    Brief Narrative:   Jeremy Fritz is a 42 y.o. male with past medical history significant for essential hypertension, polysubstance abuse with tobacco/THC/cocaine who presented to [facility] ED on 2/29 with left arm weakness.  Patient reports onset roughly 1 week ago, he reports awoke from sleep and went to the fridge rater and tried to get a drink out and dropped it to the floor.  Patient did not seek care until his mother noticed his symptoms.  Patient endorses continued tobacco abuse.  He also reports that he has a history of high cholesterol currently not treated.  Also family history of diabetes, heart disease, hyperlipidemia.  Patient denies headache, no dizziness, no chest pain, no palpitations, no shortness of breath, no abdominal pain, no fever/chills/night sweats, no nausea/vomiting/diarrhea, no paresthesias.   In the ED, temperature 98.6 F, HR 99, RR 18, BP 145/97, SpO2 100% room air.  Sodium 141, potassium 3.9, chloride 107, CO2 22, glucose 110, BUN 19, creatinine 0.96.  AST 31, ALT 22, total bilirubin 0.5.  WBC 5.6, hemoglobin 12.2, platelet count 267.  INR 1.1.  EtOH level less than 10.  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  Neurology was consulted.  TRH consulted for admission for further evaluation management of acute versus subacute CVA.  Assessment & Plan:   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  MR brain with findings of moderate to  large area of acute infarct right MCA distribution with associated edema and regional sulcal effacement but no midline shift or definite hemorrhage.  TTE with LVEF 25-30%, no LV thrombus identified, no intra-atrial shunt.  Hemoglobin A1c 5.9, LDL 109. -- Neurology following, appreciate assistance -- DAPT with aspirin 81 mg p.o. daily, Plavix 75 mg p.o. daily x 21 days followed by aspirin alone -- Atorvastatin 40 mg p.o. daily -- Monitor on telemetry -- Neurology requesting TEE to rule out LV thrombus, discussed with cardiology, n.p.o. after midnight for planned procedure tomorrow -- Continue PT/OT efforts while inpatient, pending CIR evaluation  Cardiomyopathy TTE with LVEF 25-30%, LV severely decreased function, LV with global hypokinesis, LV moderately dilated, severe LVH, no LV thrombus visualized, RV systolic function mildly reduced, biatrial enlargement, small pericardial effusion which is circumferential, moderate/severe MR, no aortic stenosis, IVC dilated.  History of chronic cocaine/tobacco use.  Denies chest pain. -- Cardiology following, appreciate assistance -- Carvedilol 3.125 mg p.o. twice daily -- Losartan 12.5 mg p.o. daily -- Jardiance 10 mg p.o. daily -- On aspirin/statin/Plavix   Essential hypertension Patient reports currently on losartan and amlodipine; intermittent use -- BP 126/90 this a.m., -- Started on carvedilol and losartan as above  Polysubstance abuse with history of tobacco, cocaine, THC UDS positive for cocaine, THC.  Counseled on need for complete abstinence/cessation. 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  Discussed with ID, Dr. Juleen China.  Recommend LP, but patient refused. -- T palladium antibodies: Pending -- Penicillin 12,000,000 units IV every 12 hours   DVT  prophylaxis: enoxaparin (LOVENOX) injection 40 mg Start: 12/25/22 2200 SCD's Start: 12/25/22 1814    Code Status: Full Code Family  Communication: No family present at bedside this morning  Disposition Plan:  Level of care: Telemetry Medical Status is: Inpatient Remains inpatient appropriate because: IV antibiotics, TEE planned for tomorrow, pending CIR evaluation   Consultants:  Neurology Cardiology Infectious disease  Procedures:  TTE Antimicrobials:  IV penicillin 3/1>>   Subjective: Patient seen examined at bedside.  Lying in bed.  Sleeping but arousable.  No complaints this morning.  TEE planned for tomorrow to rule out LV thrombus.   Denies headache, no chest pain, no fever/chills/night sweats, no nausea/vomiting/diarrhea, no abdominal pain.  No other acute concerns overnight per nursing staff.  Objective: Vitals:   12/28/22 1549 12/28/22 2112 12/29/22 0040 12/29/22 0413  BP: (!) 132/107 (!) 121/90 (!) 130/97 (!) 129/90  Pulse: (!) 104 (!) 109 97 90  Resp: '18 18 18 16  '$ Temp: 97.9 F (36.6 C) 99.1 F (37.3 C) 98.6 F (37 C) 97.7 F (36.5 C)  TempSrc: Oral Oral Oral Oral  SpO2: 97% 95% 100% 97%  Weight:      Height:        Intake/Output Summary (Last 24 hours) at 12/29/2022 1019 Last data filed at 12/29/2022 0400 Gross per 24 hour  Intake --  Output 500 ml  Net -500 ml   Filed Weights   12/25/22 1033  Weight: 74.8 kg    Examination:  Physical Exam: GEN: NAD, alert and oriented x 3, chronically ill in appearance, appears older than stated age HEENT: NCAT, PERRL, EOMI, sclera clear, MMM PULM: CTAB w/o wheezes/crackles, normal respiratory effort, on room air CV: RRR w/o M/G/R GI: abd soft, NTND, NABS, no R/G/M GU: No appreciable lesions noted to penis MSK: no peripheral edema, flaccid paralysis left upper extremity, otherwise moves all extremities independently NEURO: CN II-XII intact, sensation to light touch intact, flaccid paralysis L UE, otherwise muscle strength globally intact PSYCH: Depressed mood, flat affect Integumentary: No rashes noted to abdomen, no other concerning lesions  rashes/wounds noted to exposed skin surfaces    Data Reviewed: I have personally reviewed following labs and imaging studies  CBC: Recent Labs  Lab 12/25/22 1237 12/25/22 1329 12/29/22 0456  WBC  --  5.6 8.6  NEUTROABS  --  3.4  --   HGB 12.6* 12.2* 13.2  HCT 37.0* 37.0* 37.7*  MCV  --  87.9 84.7  PLT  --  267 [id number]   Basic Metabolic Panel: Recent Labs  Lab 12/25/22 1237 12/25/22 1329 12/29/22 0456  NA 142 141 137  K 3.7 3.9 3.9  CL 105 107 106  CO2  --  22 25  GLUCOSE 110* 110* 121*  BUN '20 19 15  '$ CREATININE 1.10 0.96 1.07  CALCIUM  --  8.8* 8.5*  MG  --   --  2.0   GFR: Estimated Creatinine Clearance: 95.2 mL/min (by C-G formula based on SCr of 1.07 mg/dL). Liver Function Tests: Recent Labs  Lab 12/25/22 1329  AST 31  ALT 22  ALKPHOS 50  BILITOT 0.5  PROT 6.6  ALBUMIN 2.8*   No results for input(s): "LIPASE", "AMYLASE" in the last 168 hours. No results for input(s): "AMMONIA" in the last 168 hours. Coagulation Profile: Recent Labs  Lab 12/25/22 1329 12/29/22 0456  INR 1.1 1.1   Cardiac Enzymes: No results for input(s): "CKTOTAL", "CKMB", "CKMBINDEX", "TROPONINI" in the last 168 hours. BNP (last 3 results) No results  for input(s): "PROBNP" in the last 8760 hours. HbA1C: Recent Labs    12/26/22 1217  HGBA1C 5.9*   CBG: No results for input(s): "GLUCAP" in the last 168 hours. Lipid Profile: Recent Labs    12/26/22 1217  CHOL 164  HDL 39*  LDLCALC 109*  TRIG 82  CHOLHDL 4.2   Thyroid Function Tests: No results for input(s): "TSH", "T4TOTAL", "FREET4", "T3FREE", "THYROIDAB" in the last 72 hours. Anemia Panel: No results for input(s): "VITAMINB12", "FOLATE", "FERRITIN", "TIBC", "IRON", "RETICCTPCT" in the last 72 hours. Sepsis Labs: No results for input(s): "PROCALCITON", "LATICACIDVEN" in the last 168 hours.  No results found for this or any previous visit (from the past 240 hour(s)).       Radiology Studies: No results  found.      Scheduled Meds:  aspirin EC  81 mg Oral Daily   atorvastatin  40 mg Oral Daily   carvedilol  3.125 mg Oral BID WC   clopidogrel  75 mg Oral Daily   empagliflozin  10 mg Oral Daily   enoxaparin (LOVENOX) injection  40 mg Subcutaneous Q24H   losartan  12.5 mg Oral Daily   nicotine  21 mg Transdermal Daily   Continuous Infusions:  sodium chloride     penicillin G potassium 12 Million Units in dextrose 5 % 500 mL CONTINUOUS infusion 12 Million Units (12/29/22 0033)     LOS: 3 days    Time spent: 46 minutes spent on chart review, discussion with nursing staff, consultants, updating family and interview/physical exam; more than 50% of that time was spent in counseling and/or coordination of care.    Janet Decesare J [place] DO [facility] Available via Epic secure chat 7am-7pm After these hours, please refer to coverage provider listed on [email or web address] 12/29/2022, 10:19 AM

## 2022-12-29 NOTE — TOC Progression Note (Signed)
Transition of Care [facility]) - Progression Note    Patient Details  Name: Jeremy Fritz MRN: [id number] Date of Birth: 07-22-1981  Transition of [facility]) CM/SW Contact  Pollie Friar, RN Phone Number: 12/29/2022, 3:07 PM  Clinical Narrative:    CIR to start insurance auth once medically ready.  Pt for TEE tomorrow. TOC following.   Expected Discharge Plan: IP Rehab Facility Barriers to Discharge: Continued Medical Work up, Ship broker  Expected Discharge Plan and Services     Post Acute Care Choice: IP Rehab Living arrangements for the past 2 months: Single Family Home                                       Social Determinants of Health (SDOH) Interventions SDOH Screenings   Food Insecurity: Food Insecurity Present (12/25/2022)  Housing: High Risk (12/25/2022)  Transportation Needs: Unmet Transportation Needs (12/25/2022)  Utilities: At Risk (12/25/2022)  Tobacco Use: High Risk (12/26/2022)    Readmission Risk Interventions     No data to display

## 2022-12-29 NOTE — Progress Notes (Signed)
STROKE TEAM PROGRESS NOTE   INTERVAL HISTORY Family members are at the bedside. Pt lying in bed, AAO x 3, still has left hemiparesis, encourage self exercise. Scheduled TEE tomorrow.   Vitals:   12/28/22 2112 12/29/22 0040 12/29/22 0413 12/29/22 1104  BP: (!) 121/90 (!) 130/97 (!) 129/90 (!) 130/95  Pulse: (!) 109 97 90 97  Resp: '18 18 16 18  '$ Temp: 99.1 F (37.3 C) 98.6 F (37 C) 97.7 F (36.5 C) 97.7 F (36.5 C)  TempSrc: Oral Oral Oral Oral  SpO2: 95% 100% 97% 98%  Weight:      Height:       CBC:  Recent Labs  Lab 12/25/22 1329 12/29/22 0456  WBC 5.6 8.6  NEUTROABS 3.4  --   HGB 12.2* 13.2  HCT 37.0* 37.7*  MCV 87.9 84.7  PLT 267 [id number]   Basic Metabolic Panel:  Recent Labs  Lab 12/25/22 1329 12/29/22 0456  NA 141 137  K 3.9 3.9  CL 107 106  CO2 22 25  GLUCOSE 110* 121*  BUN 19 15  CREATININE 0.96 1.07  CALCIUM 8.8* 8.5*  MG  --  2.0   Lipid Panel:  Recent Labs  Lab 12/26/22 1217  CHOL 164  TRIG 82  HDL 39*  CHOLHDL 4.2  VLDL 16  LDLCALC 109*   HgbA1c:  Recent Labs  Lab 12/26/22 1217  HGBA1C 5.9*   Urine Drug Screen:  Recent Labs  Lab 12/25/22 2014  LABOPIA NONE DETECTED  COCAINSCRNUR POSITIVE*  LABBENZ NONE DETECTED  AMPHETMU NONE DETECTED  THCU POSITIVE*  LABBARB NONE DETECTED    Alcohol Level  Recent Labs  Lab 12/25/22 1328  ETH <10    IMAGING past 24 hours No results found.  PHYSICAL EXAM  Temp:  [97.7 F (36.5 C)-99.1 F (37.3 C)] 97.7 F (36.5 C) (03/04 1104) Pulse Rate:  [90-109] 97 (03/04 1104) Resp:  [16-18] 18 (03/04 1104) BP: (121-132)/(90-107) 130/95 (03/04 1104) SpO2:  [95 %-100 %] 98 % (03/04 1104)  General -  Appears poorly nourished,  in no apparent distress. Cardiovascular - Regular rhythm and rate. Pulmonary -  unlabored breathing, no supplemental oxygen.   Mental Status -  Drowsy, flat affect, orientation to time, place, and person were intact. Language including expression, naming, repetition,  comprehension was assessed and found intact.  Cranial Nerves II - XII - II - Visual field intact OU. III, IV, VI - Extraocular movements intact. V - Facial sensation intact bilaterally. VII - Slight L facial droop  VIII - Hearing & vestibular intact bilaterally. X - Palate elevates symmetrically, no hoarseness. XI - Chin turning & shoulder shrug intact bilaterally. XII - Tongue protrusion intact.  Motor Strength - Left hemiplegia with drift seen in left arm and leg, increased drift in left arm 3/5 proximal and distal.  3/5 left grip, 2/5 fine finger movements. Bulk was normal and fasciculations were absent.   Motor Tone - Muscle tone was assessed at the neck and appendages and was normal.  Sensory - Light touch assessed, decreased on left face, LUE, LLE.   Coordination - The patient had normal movements in the hands and feet with no ataxia or dysmetria on right side, unable to assess left.  Tremor was absent.  Gait and Station - deferred.   ASSESSMENT/PLAN Mr. Jeremy Fritz is a 42 y.o. male with history significant for hypertension, polysubstance abuse including tobacco/THC/cocaine who presents with one week of L-sided weakness and found to have a R  MCA infarct on CT head/MRI.   Acute Ischemic R MCA Stroke, likely due to large vessel disease with uncontrolled risk factors, versus cardiomyopathy with low EF, versus small right carotid web, versus infectious vasculitis from neurosyphilis CT head Acute or subacute right posterior MCA territory infarct. Regional mass effect with slight leftward midline shift. Probable hyperdense vessel within a right M2 MCA branch, suspicious for thrombus. CTA head & neck: Acute proximal stump right M3 occlusion. Subtle transversely oriented shelf-like defect at the origin of the cervical right ICA, suspicious for a small carotid web. No significant stenosis MRI: Moderate-to-large area of acute infarct in the right MCA distribution with associated edema  and regional sulcal effacement but no midline shift or definite hemorrhage 2D Echo w/bubble: EF 25 to 30%, global hypokinesis, no LV thrombus TEE tomorrow to evaluate for LV thrombus LDL 109 HgbA1c 5.9 UDS + cocaine and THC VTE prophylaxis -Lovenox No antithrombotic prior to admission, now on ASA and Plavix daily. Recommend aspirin and plavix for 3 months, then ASA alone given intracranial occlusion.  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 Patient advised to stop using due to stroke risk.  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 After hours, contact General Neurology

## 2022-12-29 NOTE — Consult Note (Signed)
[facility] for Infectious Disease    Date of Admission:  12/25/2022     Reason for Consult: Neurosyphilis     Referring Physician: [place]  Current antibiotics: Penicillin G IV  ASSESSMENT:    42 y.o. 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  HIV screen was nonreactive. Polysubstance use: Patient with reported history of cocaine and tobacco use.  RECOMMENDATIONS:    Agree with penicillin G 24,000,000 units IV daily for treatment of neurosyphilis Discussed LP with patient and that would be our current recommendation to assess for abnormalities and VDRL.  Patient states Jeremy Fritz will think about this but currently is declining to have done Regardless, anticipate treating empirically for neurosyphilis at this time May be a candidate as an outpatient for HIV prevention through PrEP Check HCV antibody Following   Principal Problem:   Acute ischemic right MCA stroke ([place]) Active Problems:   Cocaine abuse ([place])   Acute CVA (cerebrovascular accident) ([place])   HTN (hypertension)   Continuous tobacco abuse   MEDICATIONS:    Scheduled Meds:  aspirin EC  81 mg Oral Daily   atorvastatin  40 mg Oral Daily   carvedilol  3.125 mg Oral BID WC   clopidogrel  75 mg Oral Daily   empagliflozin  10 mg Oral Daily   enoxaparin (LOVENOX) injection  40 mg Subcutaneous Q24H   losartan  12.5 mg Oral Daily   nicotine  21 mg Transdermal Daily   Continuous Infusions:  sodium chloride     penicillin G potassium 12 Million Units in dextrose 5 % 500 mL CONTINUOUS infusion 12 Million Units (12/29/22 1249)   PRN Meds:.acetaminophen **OR** acetaminophen (TYLENOL) oral liquid 160 mg/5 mL **OR** acetaminophen, levalbuterol, senna-docusate, traZODone  HPI:    Jeremy Fritz is a 42 y.o. male with past medical  history of hypertension, substance use who presented to [facility] on 12/25/2022 with left arm weakness.  Jeremy Fritz presented to the emergency department.  Imaging showed a posterior MCA infarct.  Further workup as part of his admission included HIV testing which was nonreactive and syphilis testing which was notable for an RPR reactive with a titer of 1: 64.  Jeremy Fritz was seen by neurology who raised the question of neurosyphilis.  Treponemal confirmatory antibodies are pending.  Jeremy Fritz was started on IV penicillin.  LP was recommended and declined by the patient.  We have been consulted for further recommendations.   Past Medical History:  Diagnosis Date   Narcotic abuse in remission [facility])     Social History   Tobacco Use   Smoking status: Every Day    Packs/day: 1.00    Years: 12.00    Total pack years: 12.00    Types: Cigarettes  Vaping Use   Vaping Use: Never used  Substance Use Topics   Alcohol use: Not Currently    Alcohol/week: 24.0 standard drinks of alcohol    Types: 24 Cans of beer per week   Drug use: Yes    Types: Cocaine, Marijuana    Family History  Problem Relation Age of Onset   Hyperlipidemia Mother    Hypertension Mother    Heart failure Father     No Known Allergies  Review of Systems  All other systems reviewed and are negative. Except as noted above in the HPI.  OBJECTIVE:  Blood pressure (!) 130/95, pulse 97, temperature 97.7 F (36.5 C), temperature source Oral, resp. rate 18, height 6' (1.829 m), weight 74.8 kg, SpO2 98 %. Body mass index is 22.37 kg/m.  Physical Exam Constitutional:      Appearance: Normal appearance.  HENT:     Head: Normocephalic and atraumatic.  Eyes:     Extraocular Movements: Extraocular movements intact.     Conjunctiva/sclera: Conjunctivae normal.  Abdominal:     General: There is no distension.     Palpations: Abdomen is soft.  Musculoskeletal:     Cervical back: Normal range of motion and neck supple.     Right lower leg:  No edema.     Left lower leg: No edema.  Skin:    General: Skin is warm and dry.  Neurological:     General: No focal deficit present.     Mental Status: Jeremy Fritz is alert and oriented to person, place, and time.  Psychiatric:        Mood and Affect: Mood normal.        Behavior: Behavior normal.      Lab Results: Lab Results  Component Value Date   WBC 8.6 12/29/2022   HGB 13.2 12/29/2022   HCT 37.7 (L) 12/29/2022   MCV 84.7 12/29/2022   PLT 297 12/29/2022    Lab Results  Component Value Date   NA 137 12/29/2022   K 3.9 12/29/2022   CO2 25 12/29/2022   GLUCOSE 121 (H) 12/29/2022   BUN 15 12/29/2022   CREATININE 1.07 12/29/2022   CALCIUM 8.5 (L) 12/29/2022   GFRNONAA >60 12/29/2022   GFRAA >90 12/09/2011    Lab Results  Component Value Date   ALT 22 12/25/2022   AST 31 12/25/2022   ALKPHOS 50 12/25/2022   BILITOT 0.5 12/25/2022    No results found for: "CRP"  No results found for: "ESRSEDRATE"  I have reviewed the micro and lab results in Epic.  Imaging: No results found.   Imaging independently reviewed in Epic.  Raynelle Highland for Infectious Disease [place] [phone] pager 12/29/2022, 1:19 PM

## 2022-12-30 ENCOUNTER — Encounter (HOSPITAL_COMMUNITY): Payer: Self-pay | Admitting: Internal Medicine

## 2022-12-30 ENCOUNTER — Encounter (HOSPITAL_COMMUNITY)
Admission: EM | Disposition: A | Discharge status: Rehabilitation Facility | Payer: Self-pay | Source: Home / Self Care | Attending: Internal Medicine

## 2022-12-30 ENCOUNTER — Inpatient Hospital Stay (HOSPITAL_COMMUNITY): Payer: Commercial Managed Care - HMO

## 2022-12-30 ENCOUNTER — Inpatient Hospital Stay (HOSPITAL_COMMUNITY): Payer: Commercial Managed Care - HMO | Admitting: Certified Registered Nurse Anesthetist

## 2022-12-30 DIAGNOSIS — I10 Essential (primary) hypertension: Secondary | ICD-10-CM

## 2022-12-30 DIAGNOSIS — I63511 Cerebral infarction due to unspecified occlusion or stenosis of right middle cerebral artery: Secondary | ICD-10-CM | POA: Diagnosis not present

## 2022-12-30 DIAGNOSIS — I639 Cerebral infarction, unspecified: Secondary | ICD-10-CM

## 2022-12-30 DIAGNOSIS — I340 Nonrheumatic mitral (valve) insufficiency: Secondary | ICD-10-CM

## 2022-12-30 DIAGNOSIS — I255 Ischemic cardiomyopathy: Secondary | ICD-10-CM | POA: Diagnosis not present

## 2022-12-30 DIAGNOSIS — F17210 Nicotine dependence, cigarettes, uncomplicated: Secondary | ICD-10-CM | POA: Diagnosis not present

## 2022-12-30 DIAGNOSIS — A539 Syphilis, unspecified: Secondary | ICD-10-CM | POA: Diagnosis not present

## 2022-12-30 DIAGNOSIS — F1410 Cocaine abuse, uncomplicated: Secondary | ICD-10-CM | POA: Diagnosis not present

## 2022-12-30 HISTORY — PX: BUBBLE STUDY: SHX6837

## 2022-12-30 HISTORY — PX: TEE WITHOUT CARDIOVERSION: SHX5443

## 2022-12-30 LAB — ECHO TEE
AV Mean grad: 2 mmHg
AV Peak grad: 3.8 mmHg
Ao pk vel: 0.97 m/s

## 2022-12-30 LAB — HEPATITIS C ANTIBODY: HCV Ab: NONREACTIVE

## 2022-12-30 SURGERY — ECHOCARDIOGRAM, TRANSESOPHAGEAL
Anesthesia: Monitor Anesthesia Care

## 2022-12-30 MED ORDER — LIDOCAINE 2% (20 MG/ML) 5 ML SYRINGE
INTRAMUSCULAR | Status: DC | PRN
  Administered 2022-12-30: 100 mg via INTRAVENOUS

## 2022-12-30 MED ORDER — PROPOFOL 10 MG/ML IV BOLUS
INTRAVENOUS | Status: DC | PRN
  Administered 2022-12-30: 30 mg via INTRAVENOUS

## 2022-12-30 MED ORDER — PROPOFOL 500 MG/50ML IV EMUL
INTRAVENOUS | Status: DC | PRN
  Administered 2022-12-30: 150 ug/kg/min via INTRAVENOUS

## 2022-12-30 NOTE — Progress Notes (Signed)
PT Cancellation Note  Patient Details Name: MAXYM JIMENO MRN: [id number] DOB: Oct 26, 1981   Cancelled Treatment:    Reason Eval/Treat Not Completed: Patient at procedure or test/unavailable  Wyona Almas, PT, DPT Acute Rehabilitation Services Office [place] 12/30/2022, 11:42 AM

## 2022-12-30 NOTE — H&P (View-Only) (Signed)
Progress Note  Patient Name: Jeremy Fritz Date of Encounter: 12/30/2022  Primary Cardiologist: Shelva Majestic, MD   Subjective   Patient seen examined at his bedside.  Nurse at bedside.  Patient aware of procedure today.  He offers no complaints at this time.  Scheduled Meds:  aspirin EC  81 mg Oral Daily   atorvastatin  40 mg Oral Daily   carvedilol  3.125 mg Oral BID WC   clopidogrel  75 mg Oral Daily   empagliflozin  10 mg Oral Daily   enoxaparin (LOVENOX) injection  40 mg Subcutaneous Q24H   losartan  12.5 mg Oral Daily   nicotine  21 mg Transdermal Daily   Continuous Infusions:  sodium chloride     penicillin G potassium 12 Million Units in dextrose 5 % 500 mL CONTINUOUS infusion 12 Million Units (12/30/22 0055)   PRN Meds: acetaminophen **OR** acetaminophen (TYLENOL) oral liquid 160 mg/5 mL **OR** acetaminophen, levalbuterol, senna-docusate, traZODone   Vital Signs    Vitals:   12/29/22 2321 12/30/22 0402 12/30/22 0500 12/30/22 0842  BP: 116/80 (!) 126/100  121/89  Pulse: 98 96  99  Resp: '18 18  17  '$ Temp: 98 F (36.7 C) 98.5 F (36.9 C)  98.3 F (36.8 C)  TempSrc: Oral Oral  Oral  SpO2: 100% 99%  99%  Weight:   75.1 kg   Height:       No intake or output data in the 24 hours ending 12/30/22 0859  Filed Weights   12/25/22 1033 12/30/22 0500  Weight: 74.8 kg 75.1 kg    Telemetry    Sinus rhythm- Personally Reviewed  ECG    None today- Personally Reviewed  Physical Exam    General: Comfortable, lying in bed. Head: Atraumatic, normal size  Eyes: PEERLA, EOMI  Neck: Supple, normal JVD Cardiac: Normal S1, S2; RRR; no murmurs, rubs, or gallops Lungs: Clear to auscultation bilaterally Abd: Soft, nontender, no hepatomegaly  Ext: warm, no edema Musculoskeletal: No deformities, did not perform full neurological exam. Skin: Warm and dry, no rashes, tattoos Neuro: Alert and oriented to person, place, time, and situation, did not perform full  neurological exam. Psych: Normal mood and affect   Labs    Chemistry Recent Labs  Lab 12/25/22 1237 12/25/22 1329 12/29/22 0456  NA 142 141 137  K 3.7 3.9 3.9  CL 105 107 106  CO2  --  22 25  GLUCOSE 110* 110* 121*  BUN '20 19 15  '$ CREATININE 1.10 0.96 1.07  CALCIUM  --  8.8* 8.5*  PROT  --  6.6  --   ALBUMIN  --  2.8*  --   AST  --  31  --   ALT  --  22  --   ALKPHOS  --  50  --   BILITOT  --  0.5  --   GFRNONAA  --  >60 >60  ANIONGAP  --  12 6     Hematology Recent Labs  Lab 12/25/22 1237 12/25/22 1329 12/29/22 0456  WBC  --  5.6 8.6  RBC  --  4.21* 4.45  HGB 12.6* 12.2* 13.2  HCT 37.0* 37.0* 37.7*  MCV  --  87.9 84.7  MCH  --  29.0 29.7  MCHC  --  33.0 35.0  RDW  --  15.3 15.1  PLT  --  267 297    Cardiac EnzymesNo results for input(s): "TROPONINI" in the last 168 hours. No results for input(s): "  [place]" in the last 168 hours.   BNP Recent Labs  Lab 12/28/22 1417  BNP 376.4*     DDimer No results for input(s): "DDIMER" in the last 168 hours.   Radiology    No results found.  Cardiac Studies   TTE 12/26/2022 IMPRESSIONS     1. Left ventricular ejection fraction, by estimation, is 25 to 30%. The  left ventricle has severely decreased function. The left ventricle  demonstrates global hypokinesis. The left ventricular internal cavity size  was moderately dilated. There is severe   left ventricular hypertrophy of the inferior segment. Left ventricular  diastolic function could not be evaluated. There is no LV thrombus  visualized.   2. Right ventricular systolic function mildly reduced with normal basal  excursion. The right ventricular size is mild to moderately. Tricuspid  regurgitation signal is inadequate for assessing PA pressure.   3. Left atrial size was mildly dilated.   4. Right atrial size was mildly dilated.   5. A small pericardial effusion is present. The pericardial effusion is  circumferential.   6. MR mechanism appears  ventricular functional in nature, with blunting  of the right sided pulmonary vein Doppler. The mitral valve is normal in  structure. Moderate to severe mitral valve regurgitation.   7. The aortic valve is tricuspid. Aortic valve regurgitation is not  visualized. No aortic stenosis is present.   8. The inferior vena cava is dilated in size with <50% respiratory  variability, suggesting right atrial pressure of 15 mmHg.   FINDINGS   Left Ventricle: Left ventricular ejection fraction, by estimation, is 25  to 30%. The left ventricle has severely decreased function. The left  ventricle demonstrates global hypokinesis. The left ventricular internal  cavity size was moderately dilated.  There is severe left ventricular hypertrophy of the inferior segment. Left  ventricular diastolic function could not be evaluated due to mitral  regurgitation (moderate or greater). Left ventricular diastolic function  could not be evaluated.   Right Ventricle: The right ventricular size is mild to moderately. No  increase in right ventricular wall thickness. Right ventricular systolic  function mildly reduced with normal basal excursion. Tricuspid  regurgitation signal is inadequate for assessing   PA pressure.   Left Atrium: Left atrial size was mildly dilated.   Right Atrium: Right atrial size was mildly dilated.   Pericardium: A small pericardial effusion is present. The pericardial  effusion is circumferential.   Mitral Valve: MR mechanism appears ventricular functional in nature, with  blunting of the right sided pulmonary vein Doppler. The mitral valve is  normal in structure. Moderate to severe mitral valve regurgitation.   Tricuspid Valve: The tricuspid valve is normal in structure. Tricuspid  valve regurgitation is not demonstrated. No evidence of tricuspid  stenosis.   Aortic Valve: The aortic valve is tricuspid. Aortic valve regurgitation is  not visualized. No aortic stenosis is  present. Aortic valve mean gradient  measures 4.0 mmHg. Aortic valve peak gradient measures 7.1 mmHg. Aortic  valve area, by VTI measures 2.40  cm.   Pulmonic Valve: The pulmonic valve was normal in structure. Pulmonic valve  regurgitation is not visualized. No evidence of pulmonic stenosis.   Aorta: The aortic root and ascending aorta are structurally normal, with  no evidence of dilitation.   Venous: The inferior vena cava is dilated in size with less than 50%  respiratory variability, suggesting right atrial pressure of 15 mmHg.   IAS/Shunts: No atrial level shunt detected by color  flow Doppler.     Patient Profile     42 y.o. male with polysubstance abuse and acute ischemic right MCA stroke and newly diagnosed severe left ventricular systolic function EF 20 to 25%.  Assessment & Plan    Acute ischemic right MCA stroke ([place]) Cocaine abuse ([place]) Severely depressed ejection fraction EF 25 to 30%. Moderate to severe mitral regurgitation Obstructive sleep apnea. 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   Continue on current medical therapy on carvedilol, losartan we will plan to transition to [facility] prior to discharge, Jardiance 10 mg daily.  In terms of his moderate to severe mitral regurgitation suspect secondary in the setting of his severe LV dysfunction.  Will continue to monitor.  Will reassess this once his echo has been repeated in the outpatient setting.  He is being followed by neuro as well for his acute right MCA stroke which has led to left-sided weakness.  Plan for TEE today.  Patient is aware of this.  Shared Decision Making/Informed Consent The risks [esophageal damage, perforation (1:10,000 risk), bleeding, pharyngeal hematoma as well as other potential complications associated with conscious sedation including aspiration, arrhythmia, respiratory failure and death], benefits (treatment guidance  and diagnostic support) and alternatives of a transesophageal echocardiogram were discussed in detail with Mr. Anthis and he is willing to proceed.    His blood pressure is acceptable today.  Will continue the current regimen and adjust as appropriate.  OSA-will need CPAP.  Polysubstance abuse will need to be evaluated by the case management but will defer to the primary team for next steps.      For questions or updates, please contact [place] Please consult [email or web address] for contact info under Cardiology/STEMI.      Signed, Berniece Salines, DO  12/30/2022, 8:59 AM

## 2022-12-30 NOTE — Interval H&P Note (Signed)
History and Physical Interval Note:  12/30/2022 10:54 AM  Jeremy Fritz  has presented today for surgery, with the diagnosis of STROKE.  The various methods of treatment have been discussed with the patient and family. After consideration of risks, benefits and other options for treatment, the patient has consented to  Procedure(s): TRANSESOPHAGEAL ECHOCARDIOGRAM (TEE) (N/A) as a surgical intervention.  The patient's history has been reviewed, patient examined, no change in status, stable for surgery.  I have reviewed the patient's chart and labs.  Questions were answered to the patient's satisfaction.     Kirk Ruths

## 2022-12-30 NOTE — Progress Notes (Signed)
Physical Therapy Treatment Patient Details Name: Jeremy Fritz MRN: [id number] DOB: 11-17-1980 Today's Date: 12/30/2022   History of Present Illness Pt is a 42 y/o male presenting on 2/29 with 1 week of L facial droop and UE weakness.  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 MRI with moderate-to-large area of acute infarct in the right MCA distribution. PMH includes: polysubstance abuse.    PT Comments    Pt making excellent progress towards his physical therapy goals and remains motivated to participate. Session focused on promoting left sided awareness via locating rooms/objects on left side of hallway and left sided strengthening and progressive weightbearing. Pt continuing to require min assist for dynamic balance and gait. Pt requiring min-mod cues for environmental navigation and running into one obstacle on left. Remains great candidate for AIR based on age, PLOF, family support, motivation.    Recommendations for follow up therapy are one component of a multi-disciplinary discharge planning process, led by the attending physician.  Recommendations may be updated based on patient status, additional functional criteria and insurance authorization.  Follow Up Recommendations  Acute inpatient rehab (3hours/day)     Assistance Recommended at Discharge Frequent or constant Supervision/Assistance  Patient can return home with the following Assistance with cooking/housework;Direct supervision/assist for medications management;Direct supervision/assist for financial management;Help with stairs or ramp for entrance;Assist for transportation;A little help with walking and/or transfers;A little help with bathing/dressing/bathroom   Equipment Recommendations  None recommended by PT    Recommendations for Other Services       Precautions / Restrictions Precautions Precautions: Fall Precaution Comments: L  inattention Restrictions Weight Bearing Restrictions: No     Mobility  Bed Mobility               General bed mobility comments: EOB upon entry    Transfers Overall transfer level: Needs assistance Equipment used: None Transfers: Sit to/from Stand Sit to Stand: Min guard                Ambulation/Gait Ambulation/Gait assistance: Min assist Gait Distance (Feet): 200 Feet (50", 200") Assistive device: None Gait Pattern/deviations: Step-through pattern, Decreased step length - left, Drifts right/left Gait velocity: decreased     General Gait Details: Circumduction with LLE in swing phase, tendency for left drift with min-mod multimodal cues and frequent pauses for increased L attention and awareness. pt running into 1 obstacle   Stairs Stairs: Yes Stairs assistance: Min guard Stair Management: Two rails Number of Stairs: 20     Wheelchair Mobility    Modified Rankin (Stroke Patients Only) Modified Rankin (Stroke Patients Only) Pre-Morbid Rankin Score: No symptoms Modified Rankin: Moderately severe disability     Balance Overall balance assessment: Needs assistance Sitting-balance support: No upper extremity supported, Feet supported Sitting balance-Leahy Scale: Good     Standing balance support: During functional activity, No upper extremity supported Standing balance-Leahy Scale: Fair                              Cognition Arousal/Alertness: Awake/alert Behavior During Therapy: Flat affect, Impulsive Overall Cognitive Status: Impaired/Different from baseline Area of Impairment: Attention, Memory, Following commands, Safety/judgement, Awareness, Problem solving                   Current Attention Level: Selective Memory: Decreased recall of precautions, Decreased short-term memory Following Commands: Follows one step commands consistently, Follows one step  commands with increased time, Follows multi-step commands  inconsistently Safety/Judgement: Decreased awareness of safety, Decreased awareness of deficits Awareness: Emergent Problem Solving: Slow processing, Decreased initiation, Difficulty sequencing, Requires verbal cues, Requires tactile cues General Comments: pt with increased awareness and safety today, continues to requires cueing to slow down and attend to L side        Exercises Other Exercises Other Exercises: x10 forward step ups, x 10 lateral step ups leading with L leg Other Exercises: Standing: calf raises x 10, functional reaching with LUE and RUE (when reaching with RUE, kept L hand propped against counter to promote weightbearing and pushing through L hand) Other Exercises: x10 sit to stands with L foot posterior to R foot to promote increased weightbearing    General Comments        Pertinent Vitals/Pain Pain Assessment Pain Assessment: Faces Faces Pain Scale: No hurt    Home Living                          Prior Function            PT Goals (current goals can now be found in the care plan section) Acute Rehab PT Goals Potential to Achieve Goals: Good Progress towards PT goals: Progressing toward goals    Frequency    Min 4X/week      PT Plan Current plan remains appropriate    Co-evaluation              AM-PAC PT "6 Clicks" Mobility   Outcome Measure  Help needed turning from your back to your side while in a flat bed without using bedrails?: None Help needed moving from lying on your back to sitting on the side of a flat bed without using bedrails?: None Help needed moving to and from a bed to a chair (including a wheelchair)?: A Little Help needed standing up from a chair using your arms (e.g., wheelchair or bedside chair)?: A Little Help needed to walk in hospital room?: A Little Help needed climbing 3-5 steps with a railing? : A Little 6 Click Score: 20    End of Session Equipment Utilized During Treatment: Gait belt Activity  Tolerance: Patient tolerated treatment well Patient left: in bed;with call bell/phone within reach;with family/visitor present Nurse Communication: Mobility status PT Visit Diagnosis: Unsteadiness on feet (R26.81);Other abnormalities of gait and mobility (R26.89);Muscle weakness (generalized) (M62.81);Difficulty in walking, not elsewhere classified (R26.2);Other symptoms and signs involving the nervous system (R29.898);Hemiplegia and hemiparesis Hemiplegia - Right/Left: Left Hemiplegia - dominant/non-dominant: Non-dominant Hemiplegia - caused by: Cerebral infarction     Time: [id number] PT Time Calculation (min) (ACUTE ONLY): 30 min  Charges:  $Therapeutic Activity: 23-37 mins                     Wyona Almas, PT, DPT Acute Rehabilitation Services Office [phone]    Deno Etienne 12/30/2022, 3:43 PM

## 2022-12-30 NOTE — Anesthesia Preprocedure Evaluation (Addendum)
Anesthesia Evaluation  Patient identified by MRN, date of birth, ID band Patient awake    Reviewed: Allergy & Precautions, NPO status , Patient's Chart, lab work & pertinent test results  Airway Mallampati: I       Dental  (+) Poor Dentition   Pulmonary Current Smoker and Patient abstained from smoking.   Pulmonary exam normal        Cardiovascular hypertension, Pt. on medications Normal cardiovascular exam     Neuro/Psych CVA, Residual Symptoms  negative psych ROS   GI/Hepatic ,,,(+)     substance abuse  cocaine use  Endo/Other    Renal/GU      Musculoskeletal   Abdominal Normal abdominal exam  (+)   Peds  Hematology   Anesthesia Other Findings TTE 12/26/2022 IMPRESSIONS     1. Left ventricular ejection fraction, by estimation, is 25 to 30%. The  left ventricle has severely decreased function. The left ventricle  demonstrates global hypokinesis. The left ventricular internal cavity size  was moderately dilated. There is severe   left ventricular hypertrophy of the inferior segment. Left ventricular  diastolic function could not be evaluated. There is no LV thrombus  visualized.   2. Right ventricular systolic function mildly reduced with normal basal  excursion. The right ventricular size is mild to moderately. Tricuspid  regurgitation signal is inadequate for assessing PA pressure.   3. Left atrial size was mildly dilated.   4. Right atrial size was mildly dilated.   5. A small pericardial effusion is present. The pericardial effusion is  circumferential.   6. MR mechanism appears ventricular functional in nature, with blunting  of the right sided pulmonary vein Doppler. The mitral valve is normal in  structure. Moderate to severe mitral valve regurgitation.   7. The aortic valve is tricuspid. Aortic valve regurgitation is not  visualized. No aortic stenosis is present.   8. The inferior vena cava is  dilated in size with <50% respiratory  variability, suggesting right atrial pressure of 15 mmHg.     Reproductive/Obstetrics                              Anesthesia Physical Anesthesia Plan  ASA: 3  Anesthesia Plan: MAC   Post-op Pain Management: Minimal or no pain anticipated   Induction: Intravenous  PONV Risk Score and Plan: Propofol infusion and TIVA  Airway Management Planned: Natural Airway and Mask  Additional Equipment: TEE  Intra-op Plan:   Post-operative Plan:   Informed Consent: I have reviewed the patients History and Physical, chart, labs and discussed the procedure including the risks, benefits and alternatives for the proposed anesthesia with the patient or authorized representative who has indicated his/her understanding and acceptance.     Dental advisory given  Plan Discussed with: CRNA  Anesthesia Plan Comments:          Anesthesia Quick Evaluation

## 2022-12-30 NOTE — Progress Notes (Signed)
Progress Note  Patient Name: Jeremy Fritz Date of Encounter: 12/30/2022  Primary Cardiologist: Shelva Majestic, MD   Subjective   Patient seen examined at his bedside.  Nurse at bedside.  Patient aware of procedure today.  He offers no complaints at this time.  Scheduled Meds:  aspirin EC  81 mg Oral Daily   atorvastatin  40 mg Oral Daily   carvedilol  3.125 mg Oral BID WC   clopidogrel  75 mg Oral Daily   empagliflozin  10 mg Oral Daily   enoxaparin (LOVENOX) injection  40 mg Subcutaneous Q24H   losartan  12.5 mg Oral Daily   nicotine  21 mg Transdermal Daily   Continuous Infusions:  sodium chloride     penicillin G potassium 12 Million Units in dextrose 5 % 500 mL CONTINUOUS infusion 12 Million Units (12/30/22 0055)   PRN Meds: acetaminophen **OR** acetaminophen (TYLENOL) oral liquid 160 mg/5 mL **OR** acetaminophen, levalbuterol, senna-docusate, traZODone   Vital Signs    Vitals:   12/29/22 2321 12/30/22 0402 12/30/22 0500 12/30/22 0842  BP: 116/80 (!) 126/100  121/89  Pulse: 98 96  99  Resp: '18 18  17  '$ Temp: 98 F (36.7 C) 98.5 F (36.9 C)  98.3 F (36.8 C)  TempSrc: Oral Oral  Oral  SpO2: 100% 99%  99%  Weight:   75.1 kg   Height:       No intake or output data in the 24 hours ending 12/30/22 0859  Filed Weights   12/25/22 1033 12/30/22 0500  Weight: 74.8 kg 75.1 kg    Telemetry    Sinus rhythm- Personally Reviewed  ECG    None today- Personally Reviewed  Physical Exam    General: Comfortable, lying in bed. Head: Atraumatic, normal size  Eyes: PEERLA, EOMI  Neck: Supple, normal JVD Cardiac: Normal S1, S2; RRR; no murmurs, rubs, or gallops Lungs: Clear to auscultation bilaterally Abd: Soft, nontender, no hepatomegaly  Ext: warm, no edema Musculoskeletal: No deformities, did not perform full neurological exam. Skin: Warm and dry, no rashes, tattoos Neuro: Alert and oriented to person, place, time, and situation, did not perform full  neurological exam. Psych: Normal mood and affect   Labs    Chemistry Recent Labs  Lab 12/25/22 1237 12/25/22 1329 12/29/22 0456  NA 142 141 137  K 3.7 3.9 3.9  CL 105 107 106  CO2  --  22 25  GLUCOSE 110* 110* 121*  BUN '20 19 15  '$ CREATININE 1.10 0.96 1.07  CALCIUM  --  8.8* 8.5*  PROT  --  6.6  --   ALBUMIN  --  2.8*  --   AST  --  31  --   ALT  --  22  --   ALKPHOS  --  50  --   BILITOT  --  0.5  --   GFRNONAA  --  >60 >60  ANIONGAP  --  12 6     Hematology Recent Labs  Lab 12/25/22 1237 12/25/22 1329 12/29/22 0456  WBC  --  5.6 8.6  RBC  --  4.21* 4.45  HGB 12.6* 12.2* 13.2  HCT 37.0* 37.0* 37.7*  MCV  --  87.9 84.7  MCH  --  29.0 29.7  MCHC  --  33.0 35.0  RDW  --  15.3 15.1  PLT  --  267 297    Cardiac EnzymesNo results for input(s): "TROPONINI" in the last 168 hours. No results for input(s): "  Doctor Phillips" in the last 168 hours.   BNP Recent Labs  Lab 12/28/22 1417  BNP 376.4*     DDimer No results for input(s): "DDIMER" in the last 168 hours.   Radiology    No results found.  Cardiac Studies   TTE 12/26/2022 IMPRESSIONS     1. Left ventricular ejection fraction, by estimation, is 25 to 30%. The  left ventricle has severely decreased function. The left ventricle  demonstrates global hypokinesis. The left ventricular internal cavity size  was moderately dilated. There is severe   left ventricular hypertrophy of the inferior segment. Left ventricular  diastolic function could not be evaluated. There is no LV thrombus  visualized.   2. Right ventricular systolic function mildly reduced with normal basal  excursion. The right ventricular size is mild to moderately. Tricuspid  regurgitation signal is inadequate for assessing PA pressure.   3. Left atrial size was mildly dilated.   4. Right atrial size was mildly dilated.   5. A small pericardial effusion is present. The pericardial effusion is  circumferential.   6. MR mechanism appears  ventricular functional in nature, with blunting  of the right sided pulmonary vein Doppler. The mitral valve is normal in  structure. Moderate to severe mitral valve regurgitation.   7. The aortic valve is tricuspid. Aortic valve regurgitation is not  visualized. No aortic stenosis is present.   8. The inferior vena cava is dilated in size with <50% respiratory  variability, suggesting right atrial pressure of 15 mmHg.   FINDINGS   Left Ventricle: Left ventricular ejection fraction, by estimation, is 25  to 30%. The left ventricle has severely decreased function. The left  ventricle demonstrates global hypokinesis. The left ventricular internal  cavity size was moderately dilated.  There is severe left ventricular hypertrophy of the inferior segment. Left  ventricular diastolic function could not be evaluated due to mitral  regurgitation (moderate or greater). Left ventricular diastolic function  could not be evaluated.   Right Ventricle: The right ventricular size is mild to moderately. No  increase in right ventricular wall thickness. Right ventricular systolic  function mildly reduced with normal basal excursion. Tricuspid  regurgitation signal is inadequate for assessing   PA pressure.   Left Atrium: Left atrial size was mildly dilated.   Right Atrium: Right atrial size was mildly dilated.   Pericardium: A small pericardial effusion is present. The pericardial  effusion is circumferential.   Mitral Valve: MR mechanism appears ventricular functional in nature, with  blunting of the right sided pulmonary vein Doppler. The mitral valve is  normal in structure. Moderate to severe mitral valve regurgitation.   Tricuspid Valve: The tricuspid valve is normal in structure. Tricuspid  valve regurgitation is not demonstrated. No evidence of tricuspid  stenosis.   Aortic Valve: The aortic valve is tricuspid. Aortic valve regurgitation is  not visualized. No aortic stenosis is  present. Aortic valve mean gradient  measures 4.0 mmHg. Aortic valve peak gradient measures 7.1 mmHg. Aortic  valve area, by VTI measures 2.40  cm.   Pulmonic Valve: The pulmonic valve was normal in structure. Pulmonic valve  regurgitation is not visualized. No evidence of pulmonic stenosis.   Aorta: The aortic root and ascending aorta are structurally normal, with  no evidence of dilitation.   Venous: The inferior vena cava is dilated in size with less than 50%  respiratory variability, suggesting right atrial pressure of 15 mmHg.   IAS/Shunts: No atrial level shunt detected by color  flow Doppler.     Patient Profile     42 y.o. male with polysubstance abuse and acute ischemic right MCA stroke and newly diagnosed severe left ventricular systolic function EF 20 to 25%.  Assessment & Plan    Acute ischemic right MCA stroke ([place]) Cocaine abuse ([place]) Severely depressed ejection fraction EF 25 to 30%. Moderate to severe mitral regurgitation Obstructive sleep apnea. 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   Continue on current medical therapy on carvedilol, losartan we will plan to transition to [facility] prior to discharge, Jardiance 10 mg daily.  In terms of his moderate to severe mitral regurgitation suspect secondary in the setting of his severe LV dysfunction.  Will continue to monitor.  Will reassess this once his echo has been repeated in the outpatient setting.  He is being followed by neuro as well for his acute right MCA stroke which has led to left-sided weakness.  Plan for TEE today.  Patient is aware of this.  Shared Decision Making/Informed Consent The risks [esophageal damage, perforation (1:10,000 risk), bleeding, pharyngeal hematoma as well as other potential complications associated with conscious sedation including aspiration, arrhythmia, respiratory failure and death], benefits (treatment guidance  and diagnostic support) and alternatives of a transesophageal echocardiogram were discussed in detail with Mr. Alcorta and he is willing to proceed.    His blood pressure is acceptable today.  Will continue the current regimen and adjust as appropriate.  OSA-will need CPAP.  Polysubstance abuse will need to be evaluated by the case management but will defer to the primary team for next steps.      For questions or updates, please contact [place] Please consult [email or web address] for contact info under Cardiology/STEMI.      Signed, Berniece Salines, DO  12/30/2022, 8:59 AM

## 2022-12-30 NOTE — Transfer of Care (Signed)
Immediate Anesthesia Transfer of Care Note  Patient: Jeremy Fritz  Procedure(s) Performed: TRANSESOPHAGEAL ECHOCARDIOGRAM (TEE) BUBBLE STUDY  Patient Location: Endoscopy Unit  Anesthesia Type:MAC  Level of Consciousness: drowsy  Airway & Oxygen Therapy: Patient Spontanous Breathing and Patient connected to nasal cannula oxygen  Post-op Assessment: Report given to RN and Post -op Vital signs reviewed and stable  Post vital signs: Reviewed and stable  Last Vitals:  Vitals Value Taken Time  BP    Temp    Pulse    Resp    SpO2      Last Pain:  Vitals:   12/30/22 1123  TempSrc: Tympanic  PainSc: 0-No pain      Patients Stated Pain Goal: 0 ([id number])  Complications: No notable events documented.

## 2022-12-30 NOTE — Anesthesia Postprocedure Evaluation (Signed)
Anesthesia Post Note  Patient: Jeremy Fritz  Procedure(s) Performed: TRANSESOPHAGEAL ECHOCARDIOGRAM (TEE) BUBBLE STUDY     Patient location during evaluation: Endoscopy Anesthesia Type: MAC Level of consciousness: awake Pain management: pain level controlled Vital Signs Assessment: post-procedure vital signs reviewed and stable Respiratory status: spontaneous breathing Cardiovascular status: stable Postop Assessment: no apparent nausea or vomiting Anesthetic complications: no  No notable events documented.  Last Vitals:  Vitals:   12/30/22 1230 12/30/22 1240  BP: 106/80 (!) 120/95  Pulse: 94 93  Resp: (!) 27 18  Temp:    SpO2: 97% 97%    Last Pain:  Vitals:   12/30/22 1240  TempSrc:   PainSc: 0-No pain                 Huston Foley

## 2022-12-30 NOTE — Anesthesia Procedure Notes (Signed)
Procedure Name: MAC Date/Time: 12/30/2022 12:09 PM  Performed by: Valda Favia, CRNAPre-anesthesia Checklist: Patient identified, Emergency Drugs available, Suction available, Patient being monitored and Timeout performed Patient Re-evaluated:Patient Re-evaluated prior to induction Oxygen Delivery Method: Nasal cannula Preoxygenation: Pre-oxygenation with 100% oxygen Induction Type: IV induction Airway Equipment and Method: Bite block Placement Confirmation: positive ETCO2 Dental Injury: Teeth and Oropharynx as per pre-operative assessment

## 2022-12-30 NOTE — Progress Notes (Addendum)
STROKE TEAM PROGRESS NOTE   INTERVAL HISTORY Jeremy Fritz is at the bedside. Pt sitting at the edge of the bed, neuro stable and still has mild left hemiparesis. TEE done today showed no LV thrombus. Pending CIR.   Vitals:   12/30/22 1123 12/30/22 1220 12/30/22 1230 12/30/22 1240  BP: (!) 128/106 (!) 108/92 106/80 (!) 120/95  Pulse:  96 94 93  Resp: 11 (!) 25 (!) 27 18  Temp: (!) 97.3 F (36.3 C)     TempSrc: Tympanic     SpO2: 98% 98% 97% 97%  Weight:      Height:       CBC:  Recent Labs  Lab 12/25/22 1329 12/29/22 0456  WBC 5.6 8.6  NEUTROABS 3.4  --   HGB 12.2* 13.2  HCT 37.0* 37.7*  MCV 87.9 84.7  PLT 267 [id number]   Basic Metabolic Panel:  Recent Labs  Lab 12/25/22 1329 12/29/22 0456  NA 141 137  K 3.9 3.9  CL 107 106  CO2 22 25  GLUCOSE 110* 121*  BUN 19 15  CREATININE 0.96 1.07  CALCIUM 8.8* 8.5*  MG  --  2.0   Lipid Panel:  Recent Labs  Lab 12/26/22 1217  CHOL 164  TRIG 82  HDL 39*  CHOLHDL 4.2  VLDL 16  LDLCALC 109*   HgbA1c:  Recent Labs  Lab 12/26/22 1217  HGBA1C 5.9*   Urine Drug Screen:  Recent Labs  Lab 12/25/22 2014  LABOPIA NONE DETECTED  COCAINSCRNUR POSITIVE*  LABBENZ NONE DETECTED  AMPHETMU NONE DETECTED  THCU POSITIVE*  LABBARB NONE DETECTED    Alcohol Level  Recent Labs  Lab 12/25/22 1328  ETH <10    IMAGING past 24 hours ECHO TEE  Result Date: 12/30/2022    TRANSESOPHOGEAL ECHO REPORT   Patient Name:   Jeremy Fritz Date of Exam: 12/30/2022 Medical Rec #:  [id number]       Height:       72.0 in Accession #:    [id number]      Weight:       165.6 lb Date of Birth:  Mar 04, 1981      BSA:          1.966 m Patient Age:    42 years        BP:           132/106 mmHg Patient Gender: M               HR:           105 bpm. Exam Location:  Inpatient Procedure: Transesophageal Echo, Color Doppler, Cardiac Doppler and Saline            Contrast Bubble Study Indications:     Stroke i63.9  History:         Patient has prior history of  Echocardiogram examinations, most                  recent 12/26/2022. Risk Factors:Hypertension and Polysubstance                  Abuse.  Sonographer:     Raquel Sarna Senior RDCS Referring Phys:  Jeremy Fritz, R Diagnosing Phys: Kirk Ruths MD PROCEDURE: After discussion of the risks and benefits of a TEE, an informed consent was obtained from the patient. The transesophogeal probe was passed without difficulty through the esophogus of the patient. Sedation performed by different physician. The patient was monitored while under deep sedation. Anesthestetic  sedation was provided intravenously by Anesthesiology: '233mg'$  of Propofol, '100mg'$  of Lidocaine. The patient developed no complications during the procedure.  IMPRESSIONS  1. No source of embolus identified.  2. Left ventricular ejection fraction, by estimation, is 20 to 25%. The left ventricle has severely decreased function. The left ventricle demonstrates global hypokinesis. The left ventricular internal cavity size was moderately dilated.  3. Right ventricular systolic function is moderately reduced. The right ventricular size is normal.  4. Left atrial size was mildly dilated. No left atrial/left atrial appendage thrombus was detected.  5. Right atrial size was mildly dilated.  6. The mitral valve is normal in structure. Moderate mitral valve regurgitation.  7. The aortic valve is tricuspid. Aortic valve regurgitation is not visualized.  8. Agitated saline contrast bubble study was negative, with no evidence of any interatrial shunt. FINDINGS  Left Ventricle: Left ventricular ejection fraction, by estimation, is 20 to 25%. The left ventricle has severely decreased function. The left ventricle demonstrates global hypokinesis. The left ventricular internal cavity size was moderately dilated. Right Ventricle: The right ventricular size is normal. Right ventricular systolic function is moderately reduced. Left Atrium: Left atrial size was mildly dilated. No left  atrial/left atrial appendage thrombus was detected. Right Atrium: Right atrial size was mildly dilated. Pericardium: There is no evidence of pericardial effusion. Mitral Valve: The mitral valve is normal in structure. Moderate mitral valve regurgitation. Tricuspid Valve: The tricuspid valve is normal in structure. Tricuspid valve regurgitation is trivial. Aortic Valve: The aortic valve is tricuspid. Aortic valve regurgitation is not visualized. Aortic valve mean gradient measures 2.0 mmHg. Aortic valve peak gradient measures 3.8 mmHg. Pulmonic Valve: The pulmonic valve was normal in structure. Pulmonic valve regurgitation is trivial. Aorta: The aortic root is normal in size and structure. There is minimal (Grade I) plaque involving the descending aorta. IAS/Shunts: No atrial level shunt detected by color flow Doppler. Agitated saline contrast was given intravenously to evaluate for intracardiac shunting. Agitated saline contrast bubble study was negative, with no evidence of any interatrial shunt. Additional Comments: No source of embolus identified. Spectral Doppler performed. AORTIC VALVE AV Vmax:      97.30 cm/s AV Vmean:     64.200 cm/s AV VTI:       0.155 m AV Peak Grad: 3.8 mmHg AV Mean Grad: 2.0 mmHg  AORTA Ao Root diam: 3.10 cm Ao Asc diam:  2.80 cm Kirk Ruths MD Electronically signed by Kirk Ruths MD Signature Date/Time: 12/30/2022/1:04:05 PM    Final     PHYSICAL EXAM  Temp:  [97.3 F (36.3 C)-98.5 F (36.9 C)] 97.3 F (36.3 C) (03/05 1123) Pulse Rate:  [92-101] 93 (03/05 1240) Resp:  [11-27] 18 (03/05 1240) BP: (106-140)/(79-106) 120/95 (03/05 1240) SpO2:  [97 %-100 %] 97 % (03/05 1240) Weight:  [75.1 kg] 75.1 kg (03/05 0500)  General -  Appears poorly nourished,  in no apparent distress. Cardiovascular - Regular rhythm and rate. Pulmonary -  unlabored breathing, no supplemental oxygen.   Mental Status -  Drowsy, flat affect, orientation to time, place, and person were  intact. Language including expression, naming, repetition, comprehension was assessed and found intact.  Cranial Nerves II - XII - II - Visual field intact OU. III, IV, VI - Extraocular movements intact. V - Facial sensation intact bilaterally. VII - Slight L facial droop  VIII - Hearing & vestibular intact bilaterally. X - Palate elevates symmetrically, no hoarseness. XI - Chin turning & shoulder shrug intact bilaterally. XII -  Tongue protrusion intact.  Motor Strength - Left hemiparesis with LUE proximal 3+/5 and 3/5 left grip, 2/5 fine finger movements. LLE 4/5 proximal and 4+/5 distally. Bulk was normal and fasciculations were absent.   Motor Tone - Muscle tone was assessed at the neck and appendages and was normal.  Sensory - Light touch assessed, decreased on left face, LUE, LLE.   Coordination - The patient had normal movements in the hands and feet with no ataxia or dysmetria on right side, unable to assess left.  Tremor was absent.  Gait and Station - deferred.   ASSESSMENT/PLAN Mr. CRAIG WESTERMANN is a 42 y.o. male with history significant for hypertension, polysubstance abuse including tobacco/THC/cocaine who presents with one week of L-sided weakness and found to have a R MCA infarct on CT head/MRI.   Stroke - Acute R MCA infarct, likely due to large vessel disease with uncontrolled risk factors, versus cardiomyopathy with low EF, versus infectious vasculitis from neurosyphilis. Small right carotid web can be a potential cause but less likely in this case.  CT head Acute or subacute right posterior MCA territory infarct. Regional mass effect with slight leftward midline shift. Probable hyperdense vessel within a right M2 MCA branch, suspicious for thrombus. CTA head & neck: Acute proximal stump right M3 occlusion. Subtle transversely oriented shelf-like defect at the origin of the cervical right ICA, suspicious for a small carotid web. No significant stenosis MRI:  Moderate-to-large area of acute infarct in the right MCA distribution with associated edema and regional sulcal effacement but no midline shift or definite hemorrhage 2D Echo w/bubble: EF 25 to 30%, global hypokinesis, no LV thrombus TEE Severe global reduction in LV function (EF 25-30%); no apical thrombus  LDL 109 HgbA1c 5.9 UDS + cocaine and THC VTE prophylaxis -Lovenox No antithrombotic prior to admission, now on ASA and Plavix daily. Recommend aspirin and plavix for 3 months, then ASA alone given intracranial occlusion.  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 Patient advised to stop using due to stroke risk.  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 Please call with questions. Pt will follow up with stroke clinic NP at [facility] in about 4 weeks. Thanks for the consult.   Rosalin Hawking, MD PhD Stroke Neurology 12/30/2022 1:21 PM     To contact Stroke Continuity provider, please refer to [email or web address] After  hours, contact General Neurology

## 2022-12-30 NOTE — Progress Notes (Signed)
Inpatient Rehab Admissions Coordinator:   I am following for CIR. Will send case to insurance after TEE complete if no further work up or treatment appears indicated.   Clemens Catholic, [place], [place] Admissions Coordinator  [phone] ([place]) [phone] (office)

## 2022-12-30 NOTE — Progress Notes (Signed)
Speech Language Pathology Treatment: Cognitive-Linquistic  Patient Details Name: Jeremy Fritz MRN: [id number] DOB: Dec 29, 1980 Today's Date: 12/30/2022 Time: [id number] SLP Time Calculation (min) (ACUTE ONLY): 12 min  Assessment / Plan / Recommendation Clinical Impression  Pt seen today for ongoing cognitive linguistic therapy.  Pt seems to be improving since initial evaluation on 3/1. Pt was able to discuss progress with PT/OT and exercises for L sided weakness.  He feels he is not back to 100% yet. Pt completed calculations for time, money, and medication management with 80% accuracy independently.  Pt exhibited ability to self correct.  Pt benefited from moderate verbal cuing to reach 100% accuracy.   Pt was given the reasoning subtests from the COGNISTAT to further assess cognitive function for higher level problem solving.  Pt scored 6/8 on similarties scale and 5/6 on judgments scale.  He additional passed the screening item on judgments scales.  With min verbal cues pt achieved 100% accuracy on these tasks.   Recommend continuing cognitive-linguistic therapy and ongoing assessment of higher level cognitive function. Pt would benefit from intensive IPR.   HPI HPI: Pt is a 42 y/o male presenting on 2/29 with 1 week of L facial droop and UE weakness.  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 MRI with moderate-to-large area of acute infarct in the right MCA distribution. PMH includes: polysubstance abuse.      SLP Plan  Continue with current plan of care      Recommendations for follow up therapy are one component of a multi-disciplinary discharge planning process, led by the attending physician.  Recommendations may be updated based on patient status, additional functional criteria and insurance authorization.    Recommendations  Diet recommendations: Regular                Follow Up Recommendations: Acute  inpatient rehab (3hours/day) Assistance recommended at discharge: Frequent or constant Supervision/Assistance SLP Visit Diagnosis: Cognitive communication deficit [id number]) Plan: Continue with current plan of care           Celedonio Savage, [place], [place] Office: [phone] 12/30/2022, 11:05 AM

## 2022-12-30 NOTE — Progress Notes (Signed)
     Transesophageal Echocardiogram Note  Jeremy Fritz [id number] 02/19/1981  Procedure: Transesophageal Echocardiogram Indications: CVA  Procedure Details Consent: Obtained Time Out: Verified patient identification, verified procedure, site/side was marked, verified correct patient position, special equipment/implants available, Radiology Safety Procedures followed,  medications/allergies/relevent history reviewed, required imaging and test results available.  Performed  Medications:  Pt sedated by anesthesia with diprovan 250 mg IV total.  Severe global reduction in LV function (EF 25-30); no apical thrombus, LVE, severe RV dysfunction; mild biatrial enlargement; no LAA thrombus; moderate MR; negative saline microcavitation study.     Complications: No apparent complications Patient did tolerate procedure well.  Kirk Ruths, MD

## 2022-12-30 NOTE — Progress Notes (Addendum)
PROGRESS NOTE    Jeremy Fritz  [id number] DOB: [birth year] DOA: 12/25/2022 PCP: Patient, No Pcp Per    Brief Narrative:   Jeremy Fritz is a 42 y.o. male with past medical history significant for essential hypertension, polysubstance abuse with tobacco/THC/cocaine who presented to [facility] ED on 2/29 with left arm weakness.  Patient reports onset roughly 1 week ago, he reports awoke from sleep and went to the fridge rater and tried to get a drink out and dropped it to the floor.  Patient did not seek care until his mother noticed his symptoms.  Patient endorses continued tobacco abuse.  He also reports that he has a history of high cholesterol currently not treated.  Also family history of diabetes, heart disease, hyperlipidemia.  Patient denies headache, no dizziness, no chest pain, no palpitations, no shortness of breath, no abdominal pain, no fever/chills/night sweats, no nausea/vomiting/diarrhea, no paresthesias.   In the ED, temperature 98.6 F, HR 99, RR 18, BP 145/97, SpO2 100% room air.  Sodium 141, potassium 3.9, chloride 107, CO2 22, glucose 110, BUN 19, creatinine 0.96.  AST 31, ALT 22, total bilirubin 0.5.  WBC 5.6, hemoglobin 12.2, platelet count 267.  INR 1.1.  EtOH level less than 10.  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  Neurology was consulted.  TRH consulted for admission for further evaluation management of acute versus subacute CVA.  Assessment & Plan:   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  MR brain with findings of moderate to  large area of acute infarct right MCA distribution with associated edema and regional sulcal effacement but no midline shift or definite hemorrhage.  TTE with LVEF 25-30%, no LV thrombus identified, no intra-atrial shunt.  Hemoglobin A1c 5.9, LDL 109. -- Neurology following, appreciate assistance -- DAPT with aspirin 81 mg p.o. daily, Plavix 75 mg p.o. daily x 3 months followed by aspirin alone -- Atorvastatin 40 mg p.o. daily -- Monitor on telemetry -- TEE negative for LV thrombus -- Continue PT/OT efforts while inpatient, pending CIR evaluation  Cardiomyopathy TTE with LVEF 25-30%, LV severely decreased function, LV with global hypokinesis, LV moderately dilated, severe LVH, no LV thrombus visualized, RV systolic function mildly reduced, biatrial enlargement, small pericardial effusion which is circumferential, moderate/severe MR, no aortic stenosis, IVC dilated.  History of chronic cocaine/tobacco use.  Denies chest pain. -- Cardiology following, appreciate assistance -- Carvedilol 3.125 mg p.o. twice daily -- Losartan 12.5 mg p.o. daily -- Jardiance 10 mg p.o. daily -- On aspirin/statin/Plavix   Essential hypertension Patient reports currently on losartan and amlodipine; intermittent use -- BP 126/100 this a.m., -- Started on carvedilol and losartan as above  Polysubstance abuse with history of tobacco, cocaine, THC UDS positive for cocaine, THC.  Counseled on need for complete abstinence/cessation. 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  Discussed with ID, Dr. Juleen China.  Recommend LP, but patient refused. -- T palladium antibodies: Reactive -- Penicillin 12,000,000 units IV every 12 hours   DVT prophylaxis: enoxaparin (LOVENOX) injection 40 mg Start: 12/25/22 2200 SCD's Start: 12/25/22 1814  Code Status: Full Code Family Communication: No family present at bedside this morning  Disposition Plan:  Level of care: Telemetry  Medical Status is: Inpatient Remains inpatient appropriate because: IV antibiotics, TEE planned for today, pending CIR evaluation   Consultants:  Neurology Cardiology Infectious disease  Procedures:  TTE Antimicrobials:  IV penicillin 3/1>>   Subjective: Patient seen examined at bedside.  Lying in bed.  Sleeping but arousable.  No complaints this morning.  TEE planned for this afternoon to rule out LV thrombus.   Denies headache, no chest pain, no fever/chills/night sweats, no nausea/vomiting/diarrhea, no abdominal pain.  No other acute concerns overnight per nursing staff.  Objective: Vitals:   12/29/22 2321 12/30/22 0402 12/30/22 0500 12/30/22 0842  BP: 116/80 (!) 126/100  121/89  Pulse: 98 96  99  Resp: '18 18  17  '$ Temp: 98 F (36.7 C) 98.5 F (36.9 C)  98.3 F (36.8 C)  TempSrc: Oral Oral  Oral  SpO2: 100% 99%  99%  Weight:   75.1 kg   Height:       No intake or output data in the 24 hours ending 12/30/22 0915  Filed Weights   12/25/22 1033 12/30/22 0500  Weight: 74.8 kg 75.1 kg    Examination:  Physical Exam: GEN: NAD, alert and oriented x 3, chronically ill in appearance, appears older than stated age HEENT: NCAT, PERRL, EOMI, sclera clear, MMM PULM: CTAB w/o wheezes/crackles, normal respiratory effort, on room air CV: RRR w/o M/G/R GI: abd soft, NTND, NABS, no R/G/M GU: No appreciable lesions noted to penis MSK: no peripheral edema, flaccid paralysis left upper extremity, otherwise moves all extremities independently NEURO: CN II-XII intact, sensation to light touch intact, muscle strength 2/5 L UE, otherwise muscle strength globally intact PSYCH: Depressed mood, flat affect Integumentary: No rashes noted to abdomen, no other concerning lesions rashes/wounds noted to exposed skin surfaces    Data Reviewed: I have personally reviewed following labs and imaging studies  CBC: Recent Labs  Lab 12/25/22 1237 12/25/22 1329 12/29/22 0456  WBC  --  5.6  8.6  NEUTROABS  --  3.4  --   HGB 12.6* 12.2* 13.2  HCT 37.0* 37.0* 37.7*  MCV  --  87.9 84.7  PLT  --  267 [id number]   Basic Metabolic Panel: Recent Labs  Lab 12/25/22 1237 12/25/22 1329 12/29/22 0456  NA 142 141 137  K 3.7 3.9 3.9  CL 105 107 106  CO2  --  22 25  GLUCOSE 110* 110* 121*  BUN '20 19 15  '$ CREATININE 1.10 0.96 1.07  CALCIUM  --  8.8* 8.5*  MG  --   --  2.0   GFR: Estimated Creatinine Clearance: 95.5 mL/min (by C-G formula based on SCr of 1.07 mg/dL). Liver Function Tests: Recent Labs  Lab 12/25/22 1329  AST 31  ALT 22  ALKPHOS 50  BILITOT 0.5  PROT 6.6  ALBUMIN 2.8*   No results for input(s): "LIPASE", "AMYLASE" in the last 168 hours. No results for input(s): "AMMONIA" in the last 168 hours. Coagulation Profile: Recent Labs  Lab 12/25/22 1329 12/29/22 0456  INR 1.1 1.1   Cardiac Enzymes: No results for input(s): "CKTOTAL", "CKMB", "CKMBINDEX", "TROPONINI" in the last 168 hours. BNP (last 3 results) No results for input(s): "PROBNP" in the last 8760 hours. HbA1C: No results for input(s): "HGBA1C" in the last 72 hours.  CBG: No results for input(s): "GLUCAP" in the last 168 hours. Lipid Profile: No results for  input(s): "CHOL", "HDL", "LDLCALC", "TRIG", "CHOLHDL", "LDLDIRECT" in the last 72 hours.  Thyroid Function Tests: No results for input(s): "TSH", "T4TOTAL", "FREET4", "T3FREE", "THYROIDAB" in the last 72 hours. Anemia Panel: No results for input(s): "VITAMINB12", "FOLATE", "FERRITIN", "TIBC", "IRON", "RETICCTPCT" in the last 72 hours. Sepsis Labs: No results for input(s): "PROCALCITON", "LATICACIDVEN" in the last 168 hours.  No results found for this or any previous visit (from the past 240 hour(s)).       Radiology Studies: No results found.      Scheduled Meds:  aspirin EC  81 mg Oral Daily   atorvastatin  40 mg Oral Daily   carvedilol  3.125 mg Oral BID WC   clopidogrel  75 mg Oral Daily   empagliflozin  10 mg Oral Daily    enoxaparin (LOVENOX) injection  40 mg Subcutaneous Q24H   losartan  12.5 mg Oral Daily   nicotine  21 mg Transdermal Daily   Continuous Infusions:  sodium chloride     penicillin G potassium 12 Million Units in dextrose 5 % 500 mL CONTINUOUS infusion 12 Million Units (12/30/22 0055)     LOS: 4 days    Time spent: 46 minutes spent on chart review, discussion with nursing staff, consultants, updating family and interview/physical exam; more than 50% of that time was spent in counseling and/or coordination of care.    Jailan Trimm J [place] DO [facility] Available via Epic secure chat 7am-7pm After these hours, please refer to coverage provider listed on [email or web address] 12/30/2022, 9:15 AM

## 2022-12-31 ENCOUNTER — Encounter (HOSPITAL_COMMUNITY): Payer: Self-pay | Admitting: Internal Medicine

## 2022-12-31 ENCOUNTER — Other Ambulatory Visit: Payer: Self-pay

## 2022-12-31 ENCOUNTER — Inpatient Hospital Stay (HOSPITAL_COMMUNITY)
Admission: RE | Admit: 2022-12-31 | Discharge: 2023-01-10 | DRG: 056 | Disposition: A | Discharge status: Home / Self Care | Payer: Commercial Managed Care - HMO | Source: Intra-hospital | Attending: Physical Medicine & Rehabilitation | Admitting: Physical Medicine & Rehabilitation

## 2022-12-31 ENCOUNTER — Encounter (HOSPITAL_COMMUNITY): Payer: Self-pay | Admitting: Physical Medicine & Rehabilitation

## 2022-12-31 DIAGNOSIS — I5041 Acute combined systolic (congestive) and diastolic (congestive) heart failure: Secondary | ICD-10-CM | POA: Diagnosis not present

## 2022-12-31 DIAGNOSIS — F1210 Cannabis abuse, uncomplicated: Secondary | ICD-10-CM | POA: Diagnosis present

## 2022-12-31 DIAGNOSIS — M25462 Effusion, left knee: Secondary | ICD-10-CM | POA: Diagnosis present

## 2022-12-31 DIAGNOSIS — F17210 Nicotine dependence, cigarettes, uncomplicated: Secondary | ICD-10-CM | POA: Diagnosis present

## 2022-12-31 DIAGNOSIS — R4587 Impulsiveness: Secondary | ICD-10-CM | POA: Diagnosis present

## 2022-12-31 DIAGNOSIS — G4733 Obstructive sleep apnea (adult) (pediatric): Secondary | ICD-10-CM | POA: Diagnosis present

## 2022-12-31 DIAGNOSIS — F1910 Other psychoactive substance abuse, uncomplicated: Secondary | ICD-10-CM

## 2022-12-31 DIAGNOSIS — I63511 Cerebral infarction due to unspecified occlusion or stenosis of right middle cerebral artery: Secondary | ICD-10-CM

## 2022-12-31 DIAGNOSIS — R000 Tachycardia, unspecified: Secondary | ICD-10-CM | POA: Diagnosis present

## 2022-12-31 DIAGNOSIS — I5021 Acute systolic (congestive) heart failure: Secondary | ICD-10-CM | POA: Diagnosis not present

## 2022-12-31 DIAGNOSIS — G4700 Insomnia, unspecified: Secondary | ICD-10-CM | POA: Diagnosis present

## 2022-12-31 DIAGNOSIS — J189 Pneumonia, unspecified organism: Secondary | ICD-10-CM | POA: Diagnosis not present

## 2022-12-31 DIAGNOSIS — I081 Rheumatic disorders of both mitral and tricuspid valves: Secondary | ICD-10-CM | POA: Diagnosis present

## 2022-12-31 DIAGNOSIS — Z83438 Family history of other disorder of lipoprotein metabolism and other lipidemia: Secondary | ICD-10-CM | POA: Diagnosis not present

## 2022-12-31 DIAGNOSIS — M25562 Pain in left knee: Secondary | ICD-10-CM | POA: Diagnosis present

## 2022-12-31 DIAGNOSIS — F1410 Cocaine abuse, uncomplicated: Secondary | ICD-10-CM | POA: Diagnosis present

## 2022-12-31 DIAGNOSIS — I69392 Facial weakness following cerebral infarction: Secondary | ICD-10-CM

## 2022-12-31 DIAGNOSIS — Z8249 Family history of ischemic heart disease and other diseases of the circulatory system: Secondary | ICD-10-CM | POA: Diagnosis not present

## 2022-12-31 DIAGNOSIS — Z79899 Other long term (current) drug therapy: Secondary | ICD-10-CM

## 2022-12-31 DIAGNOSIS — R7301 Impaired fasting glucose: Secondary | ICD-10-CM | POA: Insufficient documentation

## 2022-12-31 DIAGNOSIS — J440 Chronic obstructive pulmonary disease with acute lower respiratory infection: Secondary | ICD-10-CM | POA: Diagnosis present

## 2022-12-31 DIAGNOSIS — G8929 Other chronic pain: Secondary | ICD-10-CM | POA: Diagnosis present

## 2022-12-31 DIAGNOSIS — I110 Hypertensive heart disease with heart failure: Secondary | ICD-10-CM | POA: Diagnosis present

## 2022-12-31 DIAGNOSIS — I428 Other cardiomyopathies: Secondary | ICD-10-CM | POA: Diagnosis present

## 2022-12-31 DIAGNOSIS — A523 Neurosyphilis, unspecified: Secondary | ICD-10-CM | POA: Diagnosis present

## 2022-12-31 DIAGNOSIS — I10 Essential (primary) hypertension: Secondary | ICD-10-CM | POA: Diagnosis present

## 2022-12-31 DIAGNOSIS — I69354 Hemiplegia and hemiparesis following cerebral infarction affecting left non-dominant side: Principal | ICD-10-CM

## 2022-12-31 MED ORDER — NICOTINE 21 MG/24HR TD PT24
21.0000 mg | MEDICATED_PATCH | Freq: Every day | TRANSDERMAL | Status: DC
  Filled 2022-12-31 (×7): qty 1

## 2022-12-31 MED ORDER — LOSARTAN POTASSIUM 25 MG PO TABS
12.5000 mg | ORAL_TABLET | Freq: Every day | ORAL | Status: DC
  Administered 2023-01-01 – 2023-01-07 (×7): 12.5 mg via ORAL
  Filled 2022-12-31 (×8): qty 0.5

## 2022-12-31 MED ORDER — CARVEDILOL 6.25 MG PO TABS
6.2500 mg | ORAL_TABLET | Freq: Two times a day (BID) | ORAL | Status: DC
  Administered 2022-12-31: 6.25 mg via ORAL
  Filled 2022-12-31: qty 1

## 2022-12-31 MED ORDER — CARVEDILOL 6.25 MG PO TABS: 6.2500 mg | ORAL_TABLET | Freq: Two times a day (BID) | ORAL | Status: DC

## 2022-12-31 MED ORDER — ALUM & MAG HYDROXIDE-SIMETH 200-200-20 MG/5ML PO SUSP: 30.0000 mL | ORAL | Status: DC | PRN

## 2022-12-31 MED ORDER — DIPHENHYDRAMINE HCL 12.5 MG/5ML PO ELIX: 12.5000 mg | ORAL_SOLUTION | Freq: Four times a day (QID) | ORAL | Status: DC | PRN

## 2022-12-31 MED ORDER — CARVEDILOL 12.5 MG PO TABS
12.5000 mg | ORAL_TABLET | Freq: Two times a day (BID) | ORAL | Status: DC
  Administered 2023-01-01 – 2023-01-10 (×19): 12.5 mg via ORAL
  Filled 2022-12-31 (×19): qty 1

## 2022-12-31 MED ORDER — PENICILLIN G POTASSIUM 20000000 UNITS IJ SOLR: 12.0000 10*6.[IU] | Freq: Two times a day (BID) | INTRAVENOUS | Status: DC

## 2022-12-31 MED ORDER — ATORVASTATIN CALCIUM 40 MG PO TABS
40.0000 mg | ORAL_TABLET | Freq: Every day | ORAL | Status: DC
  Administered 2023-01-01 – 2023-01-10 (×10): 40 mg via ORAL
  Filled 2022-12-31 (×10): qty 1

## 2022-12-31 MED ORDER — GUAIFENESIN-DM 100-10 MG/5ML PO SYRP
5.0000 mL | ORAL_SOLUTION | Freq: Four times a day (QID) | ORAL | Status: DC | PRN
  Administered 2023-01-08: 10 mL via ORAL
  Filled 2022-12-31: qty 10

## 2022-12-31 MED ORDER — EMPAGLIFLOZIN 10 MG PO TABS
10.0000 mg | ORAL_TABLET | Freq: Every day | ORAL | Status: DC
  Administered 2023-01-01 – 2023-01-10 (×10): 10 mg via ORAL
  Filled 2022-12-31 (×10): qty 1

## 2022-12-31 MED ORDER — ENOXAPARIN SODIUM 40 MG/0.4ML IJ SOSY
40.0000 mg | PREFILLED_SYRINGE | INTRAMUSCULAR | Status: DC
  Administered 2022-12-31 – 2023-01-09 (×10): 40 mg via SUBCUTANEOUS
  Filled 2022-12-31 (×10): qty 0.4

## 2022-12-31 MED ORDER — ASPIRIN 81 MG PO TBEC
81.0000 mg | DELAYED_RELEASE_TABLET | Freq: Every day | ORAL | Status: DC
  Administered 2023-01-01 – 2023-01-10 (×10): 81 mg via ORAL
  Filled 2022-12-31 (×10): qty 1

## 2022-12-31 MED ORDER — FLEET ENEMA 7-19 GM/118ML RE ENEM: 1.0000 | ENEMA | Freq: Once | RECTAL | Status: DC | PRN

## 2022-12-31 MED ORDER — SACUBITRIL-VALSARTAN 24-26 MG PO TABS: 1.0000 | ORAL_TABLET | Freq: Two times a day (BID) | ORAL | 0 refills | Status: DC

## 2022-12-31 MED ORDER — LEVALBUTEROL HCL 0.63 MG/3ML IN NEBU: 0.6300 mg | INHALATION_SOLUTION | Freq: Four times a day (QID) | RESPIRATORY_TRACT | Status: DC | PRN

## 2022-12-31 MED ORDER — PENICILLIN G BENZATHINE 1200000 UNIT/2ML IM SUSY
2.4000 10*6.[IU] | PREFILLED_SYRINGE | Freq: Once | INTRAMUSCULAR | Status: AC
  Administered 2023-01-10: 2.4 10*6.[IU] via INTRAMUSCULAR
  Filled 2022-12-31: qty 4

## 2022-12-31 MED ORDER — NICOTINE 21 MG/24HR TD PT24: 21.0000 mg | MEDICATED_PATCH | Freq: Every day | TRANSDERMAL | 0 refills | Status: DC

## 2022-12-31 MED ORDER — BISACODYL 10 MG RE SUPP: 10.0000 mg | Freq: Every day | RECTAL | Status: DC | PRN

## 2022-12-31 MED ORDER — PROCHLORPERAZINE 25 MG RE SUPP: 12.5000 mg | Freq: Four times a day (QID) | RECTAL | Status: DC | PRN

## 2022-12-31 MED ORDER — PROCHLORPERAZINE MALEATE 5 MG PO TABS: 5.0000 mg | ORAL_TABLET | Freq: Four times a day (QID) | ORAL | Status: DC | PRN

## 2022-12-31 MED ORDER — PENICILLIN G BENZATHINE 1200000 UNIT/2ML IM SUSY: 2.4000 10*6.[IU] | PREFILLED_SYRINGE | Freq: Once | INTRAMUSCULAR | Status: DC

## 2022-12-31 MED ORDER — PROCHLORPERAZINE EDISYLATE 10 MG/2ML IJ SOLN: 5.0000 mg | Freq: Four times a day (QID) | INTRAMUSCULAR | Status: DC | PRN

## 2022-12-31 MED ORDER — CARVEDILOL 6.25 MG PO TABS: 6.2500 mg | ORAL_TABLET | Freq: Two times a day (BID) | ORAL | 0 refills | Status: DC

## 2022-12-31 MED ORDER — DAPAGLIFLOZIN PROPANEDIOL 5 MG PO TABS: 5.0000 mg | ORAL_TABLET | Freq: Every day | ORAL | 0 refills | Status: DC

## 2022-12-31 MED ORDER — CLOPIDOGREL BISULFATE 75 MG PO TABS
75.0000 mg | ORAL_TABLET | Freq: Every day | ORAL | Status: DC
  Administered 2023-01-01 – 2023-01-10 (×10): 75 mg via ORAL
  Filled 2022-12-31 (×10): qty 1

## 2022-12-31 MED ORDER — POLYETHYLENE GLYCOL 3350 17 G PO PACK: 17.0000 g | PACK | Freq: Every day | ORAL | Status: DC | PRN

## 2022-12-31 MED ORDER — PENICILLIN G POTASSIUM 20000000 UNITS IJ SOLR
12.0000 10*6.[IU] | Freq: Two times a day (BID) | INTRAVENOUS | Status: AC
  Administered 2023-01-01 – 2023-01-09 (×18): 12 10*6.[IU] via INTRAVENOUS
  Filled 2022-12-31 (×18): qty 12

## 2022-12-31 MED ORDER — ACETAMINOPHEN 325 MG PO TABS
325.0000 mg | ORAL_TABLET | ORAL | Status: DC | PRN
  Administered 2023-01-03: 325 mg via ORAL
  Administered 2023-01-05 – 2023-01-06 (×2): 650 mg via ORAL
  Filled 2022-12-31 (×3): qty 2

## 2022-12-31 MED ORDER — CLOPIDOGREL BISULFATE 75 MG PO TABS: 75.0000 mg | ORAL_TABLET | Freq: Every day | ORAL | 0 refills | Status: DC

## 2022-12-31 MED ORDER — ATORVASTATIN CALCIUM 40 MG PO TABS: 40.0000 mg | ORAL_TABLET | Freq: Every day | ORAL | 0 refills | Status: DC

## 2022-12-31 MED ORDER — TRAZODONE HCL 50 MG PO TABS
25.0000 mg | ORAL_TABLET | Freq: Every evening | ORAL | Status: DC | PRN
  Administered 2022-12-31 – 2023-01-08 (×7): 25 mg via ORAL
  Filled 2022-12-31 (×7): qty 1

## 2022-12-31 MED ORDER — ASPIRIN 81 MG PO TBEC: 81.0000 mg | DELAYED_RELEASE_TABLET | Freq: Every day | ORAL | 12 refills | Status: DC

## 2022-12-31 NOTE — Plan of Care (Signed)
  Problem: Education: Goal: Knowledge of disease or condition will improve Outcome: Progressing Goal: Knowledge of secondary prevention will improve (MUST DOCUMENT ALL) Outcome: Progressing Goal: Knowledge of patient specific risk factors will improve (Mark N/A or DELETE if not current risk factor) Outcome: Progressing   Problem: Ischemic Stroke/TIA Tissue Perfusion: Goal: Complications of ischemic stroke/TIA will be minimized Outcome: Progressing   

## 2022-12-31 NOTE — Progress Notes (Signed)
Physical Therapy Treatment Patient Details Name: Jeremy Fritz MRN: [id number] DOB: 02-13-1981 Today's Date: 12/31/2022   History of Present Illness Pt is a 42 y/o male presenting on 2/29 with 1 week of L facial droop and UE weakness.  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 MRI with moderate-to-large area of acute infarct in the right MCA distribution. PMH includes: polysubstance abuse.    PT Comments    Pt progressing steadily towards his physical therapy goals and remains motivated to participate. Session focused on progressive ambulation with promoting left sided attention by locating objects within environment. Pt ambulating ~900 ft with no assistive device at a min guard assist level; requires min cueing for obstacle negotiation. Pt continues with left hemiplegia (distal LUE weaker than LLE), impaired balance, and left inattention. Continue to recommend acute inpatient rehab (AIR) for post-acute therapy needs.    Recommendations for follow up therapy are one component of a multi-disciplinary discharge planning process, led by the attending physician.  Recommendations may be updated based on patient status, additional functional criteria and insurance authorization.  Follow Up Recommendations  Acute inpatient rehab (3hours/day)     Assistance Recommended at Discharge Frequent or constant Supervision/Assistance  Patient can return home with the following Assistance with cooking/housework;Direct supervision/assist for medications management;Direct supervision/assist for financial management;Help with stairs or ramp for entrance;Assist for transportation;A little help with walking and/or transfers;A little help with bathing/dressing/bathroom   Equipment Recommendations  None recommended by PT    Recommendations for Other Services       Precautions / Restrictions Precautions Precautions: Fall Precaution  Comments: L inattention Restrictions Weight Bearing Restrictions: No     Mobility  Bed Mobility               General bed mobility comments: Sitting EOB upon arrival    Transfers Overall transfer level: Needs assistance Equipment used: None Transfers: Sit to/from Stand Sit to Stand: Supervision                Ambulation/Gait Ambulation/Gait assistance: Min guard Gait Distance (Feet): 900 Feet Assistive device: None Gait Pattern/deviations: Step-through pattern, Decreased step length - left, Drifts right/left, Decreased dorsiflexion - left Gait velocity: decreased Gait velocity interpretation: <1.8 ft/sec, indicate of risk for recurrent falls   General Gait Details: Verbal cues for neutral L foot positioning and L heel strike at initial contact, increased foot drag with fatigue.   Stairs Stairs: Yes Stairs assistance: Min guard Stair Management: Two rails Number of Stairs: 10 General stair comments: Forward step ups with L foot leading   Wheelchair Mobility    Modified Rankin (Stroke Patients Only)       Balance Overall balance assessment: Needs assistance Sitting-balance support: No upper extremity supported, Feet supported Sitting balance-Leahy Scale: Good     Standing balance support: During functional activity, No upper extremity supported Standing balance-Leahy Scale: Fair Standing balance comment: reaching for R UE support                            Cognition Arousal/Alertness: Awake/alert Behavior During Therapy: Flat affect, Impulsive Overall Cognitive Status: Impaired/Different from baseline Area of Impairment: Attention, Memory, Following commands, Safety/judgement, Awareness, Problem solving                   Current Attention Level: Selective Memory: Decreased short-term memory, Decreased recall of precautions Following Commands: Follows one step commands  consistently, Follows one step commands with increased  time, Follows multi-step commands inconsistently Safety/Judgement: Decreased awareness of safety, Decreased awareness of deficits Awareness: Emergent Problem Solving: Slow processing, Decreased initiation, Difficulty sequencing, Requires verbal cues, Requires tactile cues General Comments: pt with increased awareness and safety today, continues to requires cueing to slow down and attend to L side        Exercises      General Comments        Pertinent Vitals/Pain Pain Assessment Pain Assessment: Faces Faces Pain Scale: No hurt    Home Living                          Prior Function            PT Goals (current goals can now be found in the care plan section) Acute Rehab PT Goals Potential to Achieve Goals: Good Progress towards PT goals: Progressing toward goals    Frequency    Min 4X/week      PT Plan Current plan remains appropriate    Co-evaluation              AM-PAC PT "6 Clicks" Mobility   Outcome Measure  Help needed turning from your back to your side while in a flat bed without using bedrails?: None Help needed moving from lying on your back to sitting on the side of a flat bed without using bedrails?: None Help needed moving to and from a bed to a chair (including a wheelchair)?: A Little Help needed standing up from a chair using your arms (e.g., wheelchair or bedside chair)?: A Little Help needed to walk in hospital room?: A Little Help needed climbing 3-5 steps with a railing? : A Little 6 Click Score: 20    End of Session Equipment Utilized During Treatment: Gait belt Activity Tolerance: Patient tolerated treatment well Patient left: in bed;with call bell/phone within reach;with family/visitor present Nurse Communication: Mobility status PT Visit Diagnosis: Unsteadiness on feet (R26.81);Other abnormalities of gait and mobility (R26.89);Muscle weakness (generalized) (M62.81);Difficulty in walking, not elsewhere classified  (R26.2);Other symptoms and signs involving the nervous system (R29.898);Hemiplegia and hemiparesis Hemiplegia - Right/Left: Left Hemiplegia - dominant/non-dominant: Non-dominant Hemiplegia - caused by: Cerebral infarction     Time: [id number] PT Time Calculation (min) (ACUTE ONLY): 21 min  Charges:  $Therapeutic Activity: 8-22 mins                     Jeremy Fritz, PT, DPT Acute Rehabilitation Services Office [phone]    Deno Etienne 12/31/2022, 4:48 PM

## 2022-12-31 NOTE — Progress Notes (Signed)
Patient transferred to 4W via wheelchair

## 2022-12-31 NOTE — Progress Notes (Addendum)
[facility] for Infectious Disease  Date of Admission:  12/25/2022           Reason for visit: Follow up on neurosyphilis  Current antibiotics: Penicillin G    ASSESSMENT:    42 y.o. 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  HIV screen was nonreactive.  He has previously declined LP but states today he is willing to do so. Polysubstance use: Patient with reported history of cocaine and tobacco use.  This could also be an explanation for ischemic stroke, however, feel that neurosyphilis rule out is warranted in this setting.  RECOMMENDATIONS:    Continue penicillin G 24,000,000 units IV daily for neurosyphilis treatment Will order LP and request CSF VDRL in addition to cell count, protein, glucose, routine cultures Patient to be discharged to CIR where we will continue to follow   ADDENDUM 12:04 PM  Notified by IR that patient would need to have LP attempted at the bedside and failed by neurology prior to bringing him down to be done with fluoroscopy.  Reached out to neurology to inquire about having this done as patient had previously declined the request to do so last week, but is now more agreeable.  Discussed with neurology and they do not feel that an LP is indicated at this time given the imperfect sensitivity and specificity of CSF VDRL and FTA-ABS as we would likely still treat for neurosyphilis even if the results were negative.  They are not sure the benefits outweigh the risk of this procedure.  While confirming the diagnosis of neurosyphilis would be nice and I'm not completely sure why an LP last week was more willing to be pursued, I agree that a negative result would likely not sway our treatment decision at this time.  Will therefore plan for IV penicillin G through 01/09/2023 to complete 2  weeks of therapy and then give a dose of Bicillin on 01/10/2023.  I will arrange follow-up as an outpatient at [facility] and sign off for now.  Please call as needed.    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    MEDICATIONS:    Scheduled Meds:  aspirin EC  81 mg Oral Daily   atorvastatin  40 mg Oral Daily   carvedilol  3.125 mg Oral BID WC   clopidogrel  75 mg Oral Daily   empagliflozin  10 mg Oral Daily   enoxaparin (LOVENOX) injection  40 mg Subcutaneous Q24H   losartan  12.5 mg Oral Daily   nicotine  21 mg Transdermal Daily   Continuous Infusions:  penicillin G potassium 12 Million Units in dextrose 5 % 500 mL CONTINUOUS infusion 12 Million Units (12/31/22 0216)   PRN Meds:.acetaminophen **OR** acetaminophen (TYLENOL) oral liquid 160 mg/5 mL **OR** acetaminophen, levalbuterol, senna-docusate, traZODone  SUBJECTIVE:   24 hour events:  Status post TEE yesterday with no evidence of LV thrombus Evidence of severe global reduction in LV function LP has continued to be declined Neurology has signed off HCV antibody yesterday nonreactive   No new complaints.  States he will do an LP at this time  Review of Systems  All other systems  reviewed and are negative.     OBJECTIVE:   Blood pressure (!) 129/98, pulse 97, temperature 98.4 F (36.9 C), temperature source Oral, resp. rate (!) 21, height 6' (1.829 m), weight 72.8 kg, SpO2 98 %. Body mass index is 21.77 kg/m.  Physical Exam Constitutional:      Comments: Patient is sleeping but awakens easily.  He states that he is willing to undergo LP for further workup at this time  HENT:     Head: Normocephalic and atraumatic.  Pulmonary:     Effort: Pulmonary effort is normal. No respiratory distress.  Abdominal:     General: There is no  distension.     Palpations: Abdomen is soft.  Neurological:     General: No focal deficit present.     Mental Status: He is oriented to person, place, and time.  Psychiatric:        Mood and Affect: Mood normal.        Behavior: Behavior normal.      Lab Results: Lab Results  Component Value Date   WBC 8.6 12/29/2022   HGB 13.2 12/29/2022   HCT 37.7 (L) 12/29/2022   MCV 84.7 12/29/2022   PLT 297 12/29/2022    Lab Results  Component Value Date   NA 137 12/29/2022   K 3.9 12/29/2022   CO2 25 12/29/2022   GLUCOSE 121 (H) 12/29/2022   BUN 15 12/29/2022   CREATININE 1.07 12/29/2022   CALCIUM 8.5 (L) 12/29/2022   GFRNONAA >60 12/29/2022   GFRAA >90 12/09/2011    Lab Results  Component Value Date   ALT 22 12/25/2022   AST 31 12/25/2022   ALKPHOS 50 12/25/2022   BILITOT 0.5 12/25/2022    No results found for: "CRP"  No results found for: "ESRSEDRATE"   I have reviewed the micro and lab results in Epic.  Imaging: ECHO TEE  Result Date: 12/30/2022    TRANSESOPHOGEAL ECHO REPORT   Patient Name:   Jeremy Fritz Date of Exam: 12/30/2022 Medical Rec #:  [id number]       Height:       72.0 in Accession #:    [id number]      Weight:       165.6 lb Date of Birth:  16-Feb-1981      BSA:          1.966 m Patient Age:    28 years        BP:           132/106 mmHg Patient Gender: M               HR:           105 bpm. Exam Location:  Inpatient Procedure: Transesophageal Echo, Color Doppler, Cardiac Doppler and Saline            Contrast Bubble Study Indications:     Stroke i63.9  History:         Patient has prior history of Echocardiogram examinations, most                  recent 12/26/2022. Risk Factors:Hypertension and Polysubstance                  Abuse.  Sonographer:     Raquel Sarna Senior RDCS Referring Phys:  Cecilie Kicks, R Diagnosing Phys: Kirk Ruths MD PROCEDURE: After discussion of the risks and benefits of a TEE, an informed consent was obtained from the patient. The  transesophogeal probe was passed without difficulty through the esophogus of the patient. Sedation performed by different physician. The patient was monitored while under deep sedation. Anesthestetic sedation was provided intravenously by Anesthesiology: '233mg'$  of Propofol, '100mg'$  of Lidocaine. The patient developed no complications during the procedure.  IMPRESSIONS  1. No source of embolus identified.  2. Left ventricular ejection fraction, by estimation, is 20 to 25%. The left ventricle has severely decreased function. The left ventricle demonstrates global hypokinesis. The left ventricular internal cavity size was moderately dilated.  3. Right ventricular systolic function is moderately reduced. The right ventricular size is normal.  4. Left atrial size was mildly dilated. No left atrial/left atrial appendage thrombus was detected.  5. Right atrial size was mildly dilated.  6. The mitral valve is normal in structure. Moderate mitral valve regurgitation.  7. The aortic valve is tricuspid. Aortic valve regurgitation is not visualized.  8. Agitated saline contrast bubble study was negative, with no evidence of any interatrial shunt. FINDINGS  Left Ventricle: Left ventricular ejection fraction, by estimation, is 20 to 25%. The left ventricle has severely decreased function. The left ventricle demonstrates global hypokinesis. The left ventricular internal cavity size was moderately dilated. Right Ventricle: The right ventricular size is normal. Right ventricular systolic function is moderately reduced. Left Atrium: Left atrial size was mildly dilated. No left atrial/left atrial appendage thrombus was detected. Right Atrium: Right atrial size was mildly dilated. Pericardium: There is no evidence of pericardial effusion. Mitral Valve: The mitral valve is normal in structure. Moderate mitral valve regurgitation. Tricuspid Valve: The tricuspid valve is normal in structure. Tricuspid valve regurgitation is trivial. Aortic  Valve: The aortic valve is tricuspid. Aortic valve regurgitation is not visualized. Aortic valve mean gradient measures 2.0 mmHg. Aortic valve peak gradient measures 3.8 mmHg. Pulmonic Valve: The pulmonic valve was normal in structure. Pulmonic valve regurgitation is trivial. Aorta: The aortic root is normal in size and structure. There is minimal (Grade I) plaque involving the descending aorta. IAS/Shunts: No atrial level shunt detected by color flow Doppler. Agitated saline contrast was given intravenously to evaluate for intracardiac shunting. Agitated saline contrast bubble study was negative, with no evidence of any interatrial shunt. Additional Comments: No source of embolus identified. Spectral Doppler performed. AORTIC VALVE AV Vmax:      97.30 cm/s AV Vmean:     64.200 cm/s AV VTI:       0.155 m AV Peak Grad: 3.8 mmHg AV Mean Grad: 2.0 mmHg  AORTA Ao Root diam: 3.10 cm Ao Asc diam:  2.80 cm Kirk Ruths MD Electronically signed by Kirk Ruths MD Signature Date/Time: 12/30/2022/1:04:05 PM    Final      Imaging independently reviewed in Epic.    Raynelle Highland for [facility] [place] [facility] [phone] pager 12/31/2022, 7:54 AM

## 2022-12-31 NOTE — Progress Notes (Signed)
Occupational Therapy Treatment Patient Details Name: Jeremy Fritz MRN: [id number] DOB: 1981-07-31 Today's Date: 12/31/2022   History of present illness Pt is a 42 y/o male presenting on 2/29 with 1 week of L facial droop and UE weakness.  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 MRI with moderate-to-large area of acute infarct in the right MCA distribution. PMH includes: polysubstance abuse.   OT comments  Patient eager to participate in OT.  Worked on LUE strength and coordination, see below for details.  Completed visual scanning exercises with increased attention to L side, completed 3 item find in room with up to min assist for balance and avoidance of obstacles.  Continued to encourage exaggerated extension of L hand with squeeze ball exercises and use of L hand as stabilizer with ADL tasks. Pt hopeful for rehab soon.  Will follow acutely.    Recommendations for follow up therapy are one component of a multi-disciplinary discharge planning process, led by the attending physician.  Recommendations may be updated based on patient status, additional functional criteria and insurance authorization.    Follow Up Recommendations  Acute inpatient rehab (3hours/day)     Assistance Recommended at Discharge Frequent or constant Supervision/Assistance  Patient can return home with the following  A little help with bathing/dressing/bathroom;Assistance with cooking/housework;Direct supervision/assist for medications management;Direct supervision/assist for financial management;Assist for transportation;Help with stairs or ramp for entrance;A little help with walking and/or transfers   Equipment Recommendations  Other (comment) (defer)    Recommendations for Other Services Rehab consult    Precautions / Restrictions Precautions Precautions: Fall Precaution Comments: L inattention Restrictions Weight Bearing  Restrictions: No       Mobility Bed Mobility Overal bed mobility: Needs Assistance Bed Mobility: Supine to Sit     Supine to sit: Supervision          Transfers Overall transfer level: Needs assistance Equipment used: None Transfers: Sit to/from Stand Sit to Stand: Min guard           General transfer comment: to steady with cueing for L sided awareness     Balance Overall balance assessment: Needs assistance Sitting-balance support: No upper extremity supported, Feet supported Sitting balance-Leahy Scale: Good     Standing balance support: During functional activity, No upper extremity supported Standing balance-Leahy Scale: Fair Standing balance comment: reaching for R UE support                           ADL either performed or assessed with clinical judgement   ADL Overall ADL's : Needs assistance/impaired     Grooming: Minimal assistance;Standing                   Toilet Transfer: Minimal assistance;Ambulation           Functional mobility during ADLs: Minimal assistance;Cueing for safety General ADL Comments: 3 item find in room, min cueing to recall items and to scan to L side; min assist to avoid obstacles in room    Extremity/Trunk Assessment              Vision       Perception     Praxis      Cognition Arousal/Alertness: Awake/alert Behavior During Therapy: Flat affect, Impulsive Overall Cognitive Status: Impaired/Different from baseline Area of Impairment: Attention, Memory, Following commands, Safety/judgement, Awareness, Problem solving  Current Attention Level: Selective Memory: Decreased short-term memory, Decreased recall of precautions Following Commands: Follows one step commands consistently, Follows one step commands with increased time, Follows multi-step commands inconsistently Safety/Judgement: Decreased awareness of safety, Decreased awareness of deficits Awareness:  Emergent Problem Solving: Slow processing, Decreased initiation, Difficulty sequencing, Requires verbal cues, Requires tactile cues General Comments: pt with increased awareness and safety today, continues to requires cueing to slow down and attend to L side        Exercises Exercises: Other exercises Other Exercises Other Exercises: x 10 grasp and release with ball using L hand given cueing for exaggerated extension of fingers Other Exercises: letter cancellation with 100% accuracy (unorganized pattern but good attention to L side), line bisection with 100% accuracy (unorganized pattern as well, but able to come back to L side and find missed lines without cueing) Other Exercises: x 6-8 reps of shoulder elevation, retraction, flexion to 90, elbow flexion/extension, supination/pronation, wrist flexion/extension and grasp --cueing for controlled movement and avoidance of compensatory patterns    Shoulder Instructions       General Comments      Pertinent Vitals/ Pain       Pain Assessment Pain Assessment: Faces Faces Pain Scale: No hurt  Home Living                                          Prior Functioning/Environment              Frequency  Min 2X/week        Progress Toward Goals  OT Goals(current goals can now be found in the care plan section)  Progress towards OT goals: Progressing toward goals  Acute Rehab OT Goals Patient Stated Goal: use my L arm Time For Goal Achievement: 01/09/23 Potential to Achieve Goals: [place] Discharge plan remains appropriate;Frequency remains appropriate    Co-evaluation                 AM-PAC OT "6 Clicks" Daily Activity     Outcome Measure   Help from another person eating meals?: A Little Help from another person taking care of personal grooming?: A Little Help from another person toileting, which includes using toliet, bedpan, or urinal?: A Little Help from another person bathing (including  washing, rinsing, drying)?: A Little Help from another person to put on and taking off regular upper body clothing?: A Little Help from another person to put on and taking off regular lower body clothing?: A Little 6 Click Score: 18    End of Session    OT Visit Diagnosis: Other abnormalities of gait and mobility (R26.89);Hemiplegia and hemiparesis;Other symptoms and signs involving cognitive function;History of falling (Z91.81) Hemiplegia - Right/Left: Left Hemiplegia - dominant/non-dominant: Non-Dominant Hemiplegia - caused by: Cerebral infarction   Activity Tolerance Patient tolerated treatment well   Patient Left with call bell/phone within reach;with bed alarm set;Other (comment) (sitting EOB)   Nurse Communication Mobility status        Time: [id number] OT Time Calculation (min): 33 min  Charges: OT General Charges $OT Visit: 1 Visit OT Treatments $Neuromuscular Re-education: 23-37 mins  [place] Office [phone]   Delight Stare 12/31/2022, 1:49 PM

## 2022-12-31 NOTE — TOC Transition Note (Signed)
Transition of Care [facility]) - CM/SW Discharge Note   Patient Details  Name: TSERING MARDIROSSIAN MRN: [id number] Date of Birth: Feb 01, 1981  Transition of Care [facility]) CM/SW Contact:  Pollie Friar, RN Phone Number: 12/31/2022, 12:56 PM   Clinical Narrative:    Pt is discharging to CIR today. CM signing off.    Final next level of care: IP Rehab Facility Barriers to Discharge: No Barriers Identified   Patient Goals and CMS Choice CMS Medicare.gov Compare Post Acute Care list provided to:: Patient Choice offered to / list presented to : Patient  Discharge Placement                         Discharge Plan and Services Additional resources added to the After Visit Summary for       Post Acute Care Choice: IP Rehab                               Social Determinants of Health (SDOH) Interventions SDOH Screenings   Food Insecurity: Food Insecurity Present (12/25/2022)  Housing: High Risk (12/25/2022)  Transportation Needs: Unmet Transportation Needs (12/25/2022)  Utilities: At Risk (12/25/2022)  Tobacco Use: High Risk (12/30/2022)     Readmission Risk Interventions     No data to display

## 2022-12-31 NOTE — Progress Notes (Signed)
Heart Failure Nurse Navigator Progress Note  PCP: Patient, No Pcp Per PCP-Cardiologist: none Admission Diagnosis: Cerebral infarction.  Admitted from: Home  Presentation:   Jeremy Fritz presented with left facial droop and left arm weakness, per patient has been going on for a few weeks. BP 131/109, HR 86, Ct head showed Acute or subacute right posterior MCA stroke. Polysubstance abuse, last used reported 2 days before admission. (Cocaine, marijuana, tobacco)EKF showed NSR, Echo 12/26/22 showed EF 25-30%, severe LVH of inferior segment, no LV thrombus was visualized, mildly reduced RV systolic function, mild biatrial enlargement, small pericardial effusion, moderate to severe MR.  Patient and his mother were educated on the sign and symptoms of heart failure, daily weights, when to call his doctor or go to the ED, Diet/ fluid restrictions, taking all medications as prescribed and attending all medical appointments. Mother shared that patients father passed away from heart failure and understands the education, patient verbalized he understood, and his being discharged to CIR for 7-10 days, and then will go home to his mothers house .A hospital HF [facility] appointment was scheduled for 01/26/2023 @ 9 am after patient discharged from [place] for rehab.  ECHO/ LVEF: 25-30%   Clinical Course:  Past Medical History:  Diagnosis Date   Cocaine abuse ([place])    Drug overdose    Marijuana abuse    Suicidal ideation      Social History   Socioeconomic History   Marital status: Single    Spouse name: Not on file   Number of children: Not on file   Years of education: Not on file   Highest education level: Not on file  Occupational History   Not on file  Tobacco Use   Smoking status: Every Day    Packs/day: 1.00    Years: 12.00    Total pack years: 12.00    Types: Cigarettes   Smokeless tobacco: Not on file  Vaping Use   Vaping Use: Never used  Substance and Sexual Activity   Alcohol use: Not  Currently    Alcohol/week: 24.0 standard drinks of alcohol    Types: 24 Cans of beer per week   Drug use: Yes    Types: Cocaine, Marijuana   Sexual activity: Yes  Other Topics Concern   Not on file  Social History Narrative   Not on file   Social Determinants of Health   Financial Resource Strain: Not on file  Food Insecurity: Food Insecurity Present (12/25/2022)   Hunger Vital Sign    Worried About Running Out of Food in the Last Year: Often true    Ran Out of Food in the Last Year: Often true  Transportation Needs: Unmet Transportation Needs (12/25/2022)   PRAPARE - Hydrologist (Medical): Yes    Lack of Transportation (Non-Medical): Yes  Physical Activity: Not on file  Stress: Not on file  Social Connections: Not on file   Education Assessment and Provision:  Detailed education and instructions provided on heart failure disease management including the following:  Signs and symptoms of Heart Failure When to call the physician Importance of daily weights Low sodium diet Fluid restriction Medication management Anticipated future follow-up appointments  Patient education given on each of the above topics.  Patient acknowledges understanding via teach back method and acceptance of all instructions.  Education Materials:  "Living Better With Heart Failure" Booklet, HF zone tool, & Daily Weight Tracker Tool.  Patient has scale at home: yes Patient has  pill box at home: NA    High Risk Criteria for Readmission and/or Poor Patient Outcomes: Heart failure hospital admissions (last 6 months): 0  No Show rate: 0 Difficult social situation: No, will dic home after CIR to mothers house. Demonstrates medication adherence: No Primary Language: English Literacy level: reading, writing, and comprehension  Barriers of Care:   Medication compliance Diet/ fluids/ daily weights Substance Cessation ( cocaine, marijuana, tobacco) patient verbalized  interested in quitting.  HF education  Considerations/Referrals:   Referral made to Heart Failure Pharmacist Stewardship: Yes Referral made to Heart Failure CSW/NCM TOC: No Referral made to Heart & Vascular TOC clinic: Yes, 01/26/2023 @ 9 am  Items for Follow-up on DC/TOC: Medication compliance Diet/ fluids/ daily weights Substance cessation ( cocaine, marijuana, tobacco) HF education   Earnestine Leys, BSN, RN Heart Failure Leisure centre manager Chat Only

## 2022-12-31 NOTE — Progress Notes (Signed)
Report given to [place], LPN, [place]

## 2022-12-31 NOTE — Discharge Summary (Signed)
Physician Discharge Summary   Patient: Jeremy Fritz MRN: [id number] DOB: 11/19/1980  Admit date:     12/25/2022  Discharge date: 12/31/2022  Discharge Physician: Berle Mull  PCP: Patient, No Pcp Per  Recommendations at discharge:  Follow up with PCP in 1 week     Follow-up [facility]. Schedule an appointment as soon as possible for a visit in 1 month(s).   Specialty: Neurology Why: stroke clinic Contact information: [address] [place] [phone]        PCP. Schedule an appointment as soon as possible for a visit in 1 week(s).   Why: establish care with PCP of your choice and follow up.        [place] [facility]. Call.   Specialty: Cardiology Why: for a follow up. Contact information: [address], [address] [place] [facility]             . Call.   Why: As needed Contact information: [place] [address] [place] [phone]               Discharge Diagnoses: Principal Problem:   Acute ischemic right MCA stroke [facility]) Active Problems:   Cocaine abuse ([place])   Acute CVA (cerebrovascular accident) ([place])   HTN (hypertension)   Continuous tobacco abuse   Neurosyphilis in adult  Assessment and Plan  Acute Right MCA infarct,  Cerebral edema with sulcal effacement but no midline shift likely due to large vessel disease with uncontrolled risk factors, versus cardiomyopathy with low EF, versus infectious vasculitis from neurosyphilis. Small right carotid web can be a potential cause but less likely in this case.   Patient presenting to ED with 1 week history of left arm weakness.  CT head Acute or subacute right posterior MCA territory infarct. Regional mass effect with slight leftward midline shift. Probable hyperdense vessel  within a right M2 MCA branch, suspicious for thrombus. CTA head & neck: Acute proximal stump right M3 occlusion. Subtle transversely oriented shelf-like defect at the origin of the cervical right ICA, suspicious for a small carotid web. No significant stenosis MRI: Moderate-to-large area of acute infarct in the right MCA distribution with associated edema and regional sulcal effacement but no midline shift or definite hemorrhage 2D Echo w/bubble: EF 25 to 30%, global hypokinesis, no LV thrombus TEE Severe global reduction in LV function (EF 25-30%); no apical thrombus  LDL 109 on Lipitor. HgbA1c 5.9 on Farxiga. UDS + cocaine and THC No antithrombotic prior to admission, now on ASA and Plavix daily. Recommend aspirin and plavix for 3 months, then ASA alone given intracranial occlusion.  Therapy recommendations: AIR   Nonischemic cardiomyopathy TTE with LVEF 25-30%, LV severely decreased function, LV with global hypokinesis, LV moderately dilated, severe LVH, no LV thrombus visualized, RV systolic function mildly reduced, biatrial enlargement, small pericardial effusion which is circumferential, moderate/severe MR, no aortic stenosis, IVC dilated.  History of chronic cocaine/tobacco use.  Denies chest pain. Cardiology following, appreciate assistance currently on GDMT. -- Carvedilol 3.125 mg p.o. twice daily -- Losartan 12.5 mg p.o. daily switch to [facility] on discharge per cardiology recommendation. -- Jardiance 10 mg p.o. daily, switch to [place] on discharge due to insurance coverage. -- On aspirin/statin/Plavix   Essential hypertension Patient reports currently on losartan and amlodipine; intermittent use --  Started on carvedilol and losartan as above   Polysubstance abuse with history of tobacco, cocaine, THC UDS positive for cocaine, THC.  Counseled on need for complete abstinence/cessation. 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   Discussed with ID, Dr. Juleen China.  Recommend LP, but patient refused. -- T palladium antibodies: Reactive -- Penicillin 12,000,000 units IV every 12 hours, last of antibiotic on 3/15. Originally lumbar puncture was planned.  Patient initially refused.  Later on agreed for the procedure.  Although risk outweighed the benefit per discussion between ID and neurology without any significant change in plan of care.  Therefore LP was not performed.  Consultants:  ID Cardiology  Neurology  Procedures performed:  Echocardiogram  TEE  DISCHARGE MEDICATION: Allergies as of 12/31/2022   No Known Allergies      Medication List     STOP taking these medications    amLODipine 5 MG tablet Commonly known as: NORVASC   losartan 100 MG tablet Commonly known as: COZAAR       TAKE these medications    albuterol 108 (90 Base) MCG/ACT inhaler Commonly known as: VENTOLIN HFA Inhale 2 puffs into the lungs every 6 (six) hours as needed for wheezing or shortness of breath.   aspirin EC 81 MG tablet Take 1 tablet (81 mg total) by mouth daily. Swallow whole. Start taking on: January 01, 2023   atorvastatin 40 MG tablet Commonly known as: LIPITOR Take 1 tablet (40 mg total) by mouth daily. Start taking on: January 01, 2023   carvedilol 6.25 MG tablet Commonly known as: COREG Take 1 tablet (6.25 mg total) by mouth 2 (two) times daily with a meal.   clopidogrel 75 MG tablet Commonly known as: PLAVIX Take 1 tablet (75 mg total) by mouth daily. Start taking on: January 01, 2023   dapagliflozin propanediol 5 MG Tabs tablet Commonly known as: Farxiga Take 1 tablet (5 mg total) by mouth daily.   naloxone 4 MG/0.1ML Liqd nasal spray kit Commonly known as: NARCAN Take in case of overdose, call 911   nicotine 21 mg/24hr patch Commonly known as: NICODERM CQ - dosed in mg/24 hours Place 1 patch (21 mg total) onto the skin daily. Start taking on: January 01, 2023   penicillin G potassium 12 Million  Units in dextrose 5 % 500 mL Inject 12 Million Units into the vein every 12 (twelve) hours for 9 days.   sacubitril-valsartan 24-26 MG Commonly known as: ENTRESTO Take 1 tablet by mouth 2 (two) times daily. Start taking on: January 01, 2023       Disposition: CIR Diet recommendation: Cardiac diet  Discharge Exam: Vitals:   12/30/22 2356 12/31/22 0321 12/31/22 0755 12/31/22 1143  BP: 119/86 (!) 129/98 (!) 130/90 (!) 134/96  Pulse: 91 97 94 (!) 108  Resp: 20 (!) 21  18  Temp: (!) 97.5 F (36.4 C) 98.4 F (36.9 C) 98 F (36.7 C) 98 F (36.7 C)  TempSrc: Oral Oral Oral   SpO2: 98% 98%  100%  Weight:  72.8 kg    Height:       General: Appear in no distress; no visible Abnormal Neck Mass Or lumps, Conjunctiva normal Cardiovascular: S1 and S2 Present, no Murmur, Respiratory: good respiratory effort, Bilateral Air entry present and CTA, no Crackles, no wheezes Abdomen: Bowel Sound present, Non tender  Extremities: no Pedal edema Neurology: alert and oriented to time, place, and person  [employer]  12/25/22 1033 12/30/22 0500 12/31/22 0321  Weight: 74.8 kg 75.1 kg 72.8 kg   Condition at discharge: stable  The results of significant diagnostics from this hospitalization (including imaging, microbiology, ancillary and laboratory) are listed below for reference.   Imaging Studies: ECHO TEE  Result Date: 12/30/2022    TRANSESOPHOGEAL ECHO REPORT   Patient Name:   MARTESE VANATTA Date of Exam: 12/30/2022 Medical Rec #:  [id number]       Height:       72.0 in Accession #:    [id number]      Weight:       165.6 lb Date of Birth:  07/18/1981      BSA:          1.966 m Patient Age:    42 years        BP:           132/106 mmHg Patient Gender: M               HR:           105 bpm. Exam Location:  Inpatient Procedure: Transesophageal Echo, Color Doppler, Cardiac Doppler and Saline            Contrast Bubble Study Indications:     Stroke i63.9  History:         Patient has prior history of  Echocardiogram examinations, most                  recent 12/26/2022. Risk Factors:Hypertension and Polysubstance                  Abuse.  Sonographer:     Raquel Sarna Senior RDCS Referring Phys:  Cecilie Kicks, R Diagnosing Phys: Kirk Ruths MD PROCEDURE: After discussion of the risks and benefits of a TEE, an informed consent was obtained from the patient. The transesophogeal probe was passed without difficulty through the esophogus of the patient. Sedation performed by different physician. The patient was monitored while under deep sedation. Anesthestetic sedation was provided intravenously by Anesthesiology: 233mg  of Propofol, 100mg  of Lidocaine. The patient developed no complications during the procedure.  IMPRESSIONS  1. No source of embolus identified.  2. Left ventricular ejection fraction, by estimation, is 20 to 25%. The left ventricle has severely decreased function. The left ventricle demonstrates global hypokinesis. The left ventricular internal cavity size was moderately dilated.  3. Right ventricular systolic function is moderately reduced. The right ventricular size is normal.  4. Left atrial size was mildly dilated. No left atrial/left atrial appendage thrombus was detected.  5. Right atrial size was mildly dilated.  6. The mitral valve is normal in structure. Moderate mitral valve regurgitation.  7. The aortic valve is tricuspid. Aortic valve regurgitation is not visualized.  8. Agitated saline contrast bubble study was negative, with no evidence of any interatrial shunt. FINDINGS  Left Ventricle: Left ventricular ejection fraction, by estimation, is 20 to 25%. The left ventricle has severely decreased function. The left ventricle demonstrates global hypokinesis. The left ventricular internal cavity size was moderately dilated. Right Ventricle: The right ventricular size is normal. Right ventricular systolic function is moderately reduced. Left Atrium: Left atrial size was mildly dilated. No left  atrial/left atrial appendage thrombus was detected. Right Atrium: Right atrial size was mildly dilated. Pericardium: There is no evidence of pericardial effusion. Mitral Valve: The mitral valve is normal in structure. Moderate mitral valve regurgitation. Tricuspid Valve: The tricuspid valve is normal in structure. Tricuspid valve  regurgitation is trivial. Aortic Valve: The aortic valve is tricuspid. Aortic valve regurgitation is not visualized. Aortic valve mean gradient measures 2.0 mmHg. Aortic valve peak gradient measures 3.8 mmHg. Pulmonic Valve: The pulmonic valve was normal in structure. Pulmonic valve regurgitation is trivial. Aorta: The aortic root is normal in size and structure. There is minimal (Grade I) plaque involving the descending aorta. IAS/Shunts: No atrial level shunt detected by color flow Doppler. Agitated saline contrast was given intravenously to evaluate for intracardiac shunting. Agitated saline contrast bubble study was negative, with no evidence of any interatrial shunt. Additional Comments: No source of embolus identified. Spectral Doppler performed. AORTIC VALVE AV Vmax:      97.30 cm/s AV Vmean:     64.200 cm/s AV VTI:       0.155 m AV Peak Grad: 3.8 mmHg AV Mean Grad: 2.0 mmHg  AORTA Ao Root diam: 3.10 cm Ao Asc diam:  2.80 cm Kirk Ruths MD Electronically signed by Kirk Ruths MD Signature Date/Time: 12/30/2022/1:04:05 PM    Final    ECHOCARDIOGRAM COMPLETE  Result Date: 12/26/2022    ECHOCARDIOGRAM REPORT   Patient Name:   DONTRAIL BLACKWELL Date of Exam: 12/26/2022 Medical Rec #:  [id number]       Height:       72.0 in Accession #:    [id number]      Weight:       164.9 lb Date of Birth:  Sep 24, 1981      BSA:          1.963 m Patient Age:    51 years        BP:           124/85 mmHg Patient Gender: M               HR:           100 bpm. Exam Location:  Inpatient Procedure: 2D Echo, Cardiac Doppler and Color Doppler Indications:    Stroke I63.9  History:        Patient has no prior  history of Echocardiogram examinations.                 Stroke; Risk Factors:Hypertension and Current Smoker.  Sonographer:    Ronny Flurry Referring Phys: [id number] ERIC J [place] IMPRESSIONS  1. Left ventricular ejection fraction, by estimation, is 25 to 30%. The left ventricle has severely decreased function. The left ventricle demonstrates global hypokinesis. The left ventricular internal cavity size was moderately dilated. There is severe  left ventricular hypertrophy of the inferior segment. Left ventricular diastolic function could not be evaluated. There is no LV thrombus visualized.  2. Right ventricular systolic function mildly reduced with normal basal excursion. The right ventricular size is mild to moderately. Tricuspid regurgitation signal is inadequate for assessing PA pressure.  3. Left atrial size was mildly dilated.  4. Right atrial size was mildly dilated.  5. A small pericardial effusion is present. The pericardial effusion is circumferential.  6. MR mechanism appears ventricular functional in nature, with blunting of the right sided pulmonary vein Doppler. The mitral valve is normal in structure. Moderate to severe mitral valve regurgitation.  7. The aortic valve is tricuspid. Aortic valve regurgitation is not visualized. No aortic stenosis is present.  8. The inferior vena cava is dilated in size with <50% respiratory variability, suggesting right atrial pressure of 15 mmHg. FINDINGS  Left Ventricle: Left ventricular ejection fraction, by estimation, is 25 to 30%. The left ventricle  has severely decreased function. The left ventricle demonstrates global hypokinesis. The left ventricular internal cavity size was moderately dilated. There is severe left ventricular hypertrophy of the inferior segment. Left ventricular diastolic function could not be evaluated due to mitral regurgitation (moderate or greater). Left ventricular diastolic function could not be evaluated. Right Ventricle: The right  ventricular size is mild to moderately. No increase in right ventricular wall thickness. Right ventricular systolic function mildly reduced with normal basal excursion. Tricuspid regurgitation signal is inadequate for assessing  PA pressure. Left Atrium: Left atrial size was mildly dilated. Right Atrium: Right atrial size was mildly dilated. Pericardium: A small pericardial effusion is present. The pericardial effusion is circumferential. Mitral Valve: MR mechanism appears ventricular functional in nature, with blunting of the right sided pulmonary vein Doppler. The mitral valve is normal in structure. Moderate to severe mitral valve regurgitation. Tricuspid Valve: The tricuspid valve is normal in structure. Tricuspid valve regurgitation is not demonstrated. No evidence of tricuspid stenosis. Aortic Valve: The aortic valve is tricuspid. Aortic valve regurgitation is not visualized. No aortic stenosis is present. Aortic valve mean gradient measures 4.0 mmHg. Aortic valve peak gradient measures 7.1 mmHg. Aortic valve area, by VTI measures 2.40 cm. Pulmonic Valve: The pulmonic valve was normal in structure. Pulmonic valve regurgitation is not visualized. No evidence of pulmonic stenosis. Aorta: The aortic root and ascending aorta are structurally normal, with no evidence of dilitation. Venous: The inferior vena cava is dilated in size with less than 50% respiratory variability, suggesting right atrial pressure of 15 mmHg. IAS/Shunts: No atrial level shunt detected by color flow Doppler.  LEFT VENTRICLE PLAX 2D LVIDd:         6.10 cm   Diastology LVIDs:         5.40 cm   LV e' medial:    6.20 cm/s LV PW:         1.50 cm   LV E/e' medial:  22.7 LV IVS:        1.10 cm   LV e' lateral:   10.10 cm/s LVOT diam:     2.20 cm   LV E/e' lateral: 14.0 LV SV:         54 LV SV Index:   27 LVOT Area:     3.80 cm  RIGHT VENTRICLE             IVC RV S prime:     15.30 cm/s  IVC diam: 2.80 cm TAPSE (M-mode): 1.6 cm LEFT ATRIUM              Index        RIGHT ATRIUM           Index LA diam:        4.30 cm 2.19 cm/m   RA Area:     20.20 cm LA Vol (A2C):   68.4 ml 34.85 ml/m  RA Volume:   62.30 ml  31.74 ml/m LA Vol (A4C):   73.3 ml 37.34 ml/m LA Biplane Vol: 70.8 ml 36.07 ml/m  AORTIC VALVE AV Area (Vmax):    2.88 cm AV Area (Vmean):   2.62 cm AV Area (VTI):     2.40 cm AV Vmax:           133.00 cm/s AV Vmean:          95.300 cm/s AV VTI:            0.223 m AV Peak Grad:      7.1  mmHg AV Mean Grad:      4.0 mmHg LVOT Vmax:         100.60 cm/s LVOT Vmean:        65.600 cm/s LVOT VTI:          0.141 m LVOT/AV VTI ratio: 0.63  AORTA Ao Root diam: 3.70 cm Ao Asc diam:  3.05 cm MV E velocity: 141.00 cm/s                             SHUNTS                             Systemic VTI:  0.14 m                             Systemic Diam: 2.20 cm Rudean Haskell MD Electronically signed by Rudean Haskell MD Signature Date/Time: 12/26/2022/4:27:48 PM    Final    MR BRAIN WO CONTRAST  Result Date: 12/26/2022 CLINICAL DATA:  Presented on 02/29 with left arm weakness. EXAM: MRI HEAD WITHOUT CONTRAST TECHNIQUE: Multiplanar, multiecho pulse sequences of the brain and surrounding structures were obtained without intravenous contrast. COMPARISON:  CT/CTA head and neck 12/25/2022 FINDINGS: Incomplete study with axial and coronal DWI, motion degraded axial T2 and FLAIR, and motion degraded axial GRE sequences obtained. Brain: There is moderate-to-large area of diffusion restriction in the right MCA distribution involving the posterior temporal lobe, insula, external capsule, and corona radiata consistent with acute infarct. There are also punctate acute infarcts in the high right parietal cortex. There is associated FLAIR signal abnormality and edema with sulcal effacement but no midline shift. There is no definite evidence of hemorrhage. Background parenchymal volume is normal. The ventricles are normal in size. Parenchymal signal is otherwise  normal. Vascular: The major flow voids are normal on the provided sequences. Skull and upper cervical spine: Suboptimally evaluated on the provided sequences. Sinuses/Orbits: The paranasal sinuses are clear. The globes and orbits are grossly unremarkable. Other: None. IMPRESSION: Moderate-to-large area of acute infarct in the right MCA distribution with associated edema and regional sulcal effacement but no midline shift or definite hemorrhage. Electronically Signed   By: Valetta Mole M.D.   On: 12/26/2022 10:11   CT ANGIO HEAD NECK W WO CM  Result Date: 12/25/2022 CLINICAL DATA:  Initial evaluation for neuro deficit, stroke. EXAM: CT ANGIOGRAPHY HEAD AND NECK TECHNIQUE: Multidetector CT imaging of the head and neck was performed using the standard protocol during bolus administration of intravenous contrast. Multiplanar CT image reconstructions and MIPs were obtained to evaluate the vascular anatomy. Carotid stenosis measurements (when applicable) are obtained utilizing NASCET criteria, using the distal internal carotid diameter as the denominator. RADIATION DOSE REDUCTION: This exam was performed according to the departmental dose-optimization program which includes automated exposure control, adjustment of the mA and/or kV according to patient size and/or use of iterative reconstruction technique. CONTRAST:  52mL OMNIPAQUE IOHEXOL 350 MG/ML SOLN COMPARISON:  Prior CT from earlier the same day. FINDINGS: CTA NECK FINDINGS Aortic arch: Visualized aortic arch normal in caliber. Aberrant right subclavian artery noted. No stenosis about the origin of the great vessels. Right carotid system: Right common and internal carotid arteries are patent without dissection. Subtle transverse oriented Leigh shelf-like defect at the origin of the cervical right ICA, suspicious for a small carotid web (series 9, image 127).  No significant stenosis. Left carotid system: Left common and internal carotid arteries are patent  without stenosis or dissection. Vertebral arteries: Both vertebral arteries arise from subclavian arteries. No proximal subclavian artery stenosis. Left vertebral artery slightly dominant. Vertebral arteries are patent without stenosis or dissection. Skeleton: No discrete or worrisome osseous lesions. Mild cervical spondylosis without significant spinal stenosis. Other neck: No other acute soft tissue abnormality within the neck. Upper chest: Mild scattered ground-glass density with interlobular septal thickening within the visualized lungs, likely mild pulmonary interstitial congestion/edema. Visualized upper chest demonstrates no other acute finding. Review of the MIP images confirms the above findings CTA HEAD FINDINGS Anterior circulation: Both internal carotid arteries are patent to the termini without stenosis or other abnormality. A1 segments patent bilaterally. Normal anterior communicating artery complex. Anterior cerebral arteries patent without stenosis. No M1 stenosis or occlusion. Distal left MCA branches widely patent and well perfused. On the right, there is an acute proximal stump right M3 occlusion, inferior division (series 7, image 134), in keeping with the evolving right MCA territory infarct. Remainder of the right MCA branches are patent and perfused. Posterior circulation: Minimal nonstenotic plaque noted within the dominant left V4 segment. Right V4 widely patent as well. Both PICA patent. Basilar widely patent without stenosis. Superior cerebellar arteries patent bilaterally. Both PCAs primarily supplied via the basilar. Short-segment mild proximal left P2 stenosis noted. PCAs otherwise widely patent to their distal aspects. Venous sinuses: Patent allowing for timing the contrast bolus. Anatomic variants: None significant.  No aneurysm. Review of the MIP images confirms the above findings IMPRESSION: 1. Acute proximal stump right M3 occlusion, inferior division, in keeping with the evolving  right MCA territory infarct. 2. Otherwise wide patency of the major arterial vasculature of the head and neck. No other emergent finding. 3. Subtle transversely oriented shelf-like defect at the origin of the cervical right ICA, suspicious for a small carotid web. No significant stenosis. 4. Aberrant right subclavian artery. 5. Mild scattered ground-glass density with interlobular septal thickening within the visualized lungs, likely mild pulmonary interstitial congestion/edema Electronically Signed   By: Jeannine Boga M.D.   On: 12/25/2022 20:59   CT HEAD WO CONTRAST  Result Date: 12/25/2022 CLINICAL DATA:  Neuro deficit, acute, stroke suspected EXAM: CT HEAD WITHOUT CONTRAST TECHNIQUE: Contiguous axial images were obtained from the base of the skull through the vertex without intravenous contrast. RADIATION DOSE REDUCTION: This exam was performed according to the departmental dose-optimization program which includes automated exposure control, adjustment of the mA and/or kV according to patient size and/or use of iterative reconstruction technique. COMPARISON:  None Available. FINDINGS: Brain: Abnormal edema and loss of gray differentiation involving the posterior right insula as well as overlying frontal and parietal lobes, compatible with posterior right MCA territory infarct. Regional mass effect with slight leftward midline shift. No evidence of mass lesion or hydrocephalus. No mass occupying acute hemorrhage. Vascular: Probable hyperdense vessel within a right M2 MCA branch. Skull: No acute fracture. Sinuses/Orbits: Largely clear sinuses.  No acute orbital findings. Other: No mastoid effusions. IMPRESSION: 1. Acute or subacute right posterior MCA territory infarct. Regional mass effect with slight leftward midline shift. MRI could further characterize. 2. Probable hyperdense vessel within a right M2 MCA branch, suspicious for thrombus. A CTA could further evaluate. Electronically Signed   By:  Margaretha Sheffield M.D.   On: 12/25/2022 13:03    Microbiology: No results found for this or any previous visit. Labs: CBC: Recent Labs  Lab 12/25/22 1237 12/25/22  1329 12/29/22 0456  WBC  --  5.6 8.6  NEUTROABS  --  3.4  --   HGB 12.6* 12.2* 13.2  HCT 37.0* 37.0* 37.7*  MCV  --  87.9 84.7  PLT  --  267 071   Basic Metabolic Panel: Recent Labs  Lab 12/25/22 1237 12/25/22 1329 12/29/22 0456  NA 142 141 137  K 3.7 3.9 3.9  CL 105 107 106  CO2  --  22 25  GLUCOSE 110* 110* 121*  BUN 20 19 15   CREATININE 1.10 0.96 1.07  CALCIUM  --  8.8* 8.5*  MG  --   --  2.0   Liver Function Tests: Recent Labs  Lab 12/25/22 1329  AST 31  ALT 22  ALKPHOS 50  BILITOT 0.5  PROT 6.6  ALBUMIN 2.8*   CBG: No results for input(s): "GLUCAP" in the last 168 hours.  Discharge time spent: greater than 30 minutes.  Signed: Berle Mull, MD [facility]

## 2022-12-31 NOTE — Progress Notes (Addendum)
PMR Admission Coordinator Pre-Admission Assessment   Patient: Jeremy Fritz is an 42 y.o., male MRN: [id number] DOB: 07/06/1981 Height: 6' (182.9 cm) Weight: 72.8 kg   Insurance Information HMO: yes     PPO:  PCP:      IPA:      80/20:      OTHER:                   PRIMARYChristella Scheuermann [place] HMO Connect     Policy#: [id number]      Subscriber: pt.  CM Name: Sharyn Lull      Phone#: [phone] x [phone]     Fax#:  [phone] Pre-Cert#: [id number]      Employer:  Theadora Rama with [employer] called and stated pt. Approved on 3/6, approved for admit 3/6-3/13 Benefits: [id number] Eff Date: 06/27/2022- 10/27/2023   Deductible: does not have one OOP Max: $1,800 ($0 met) CIR: 75% coverage, 25% co-insurance SNF: 75% coverage, 25% co-insurance Outpatient: 75% coverage, 25% co-insurance; limited to 30 combined visits/cal yr (30 remaining) Home Health:  75% coverage, 25% co-insurance DME: 75% coverage, 25% co-insurance Providers: in network  SECONDARY:       Policy#:      Phone#:  Development worker, community:       Phone#:    The Engineer, petroleum" for patients in Inpatient Rehabilitation Facilities with attached "Privacy Act [place] Records" was provided and verbally reviewed with: N/A   Emergency Contact Information Contact Information       Name Relation [place] Mother [phone]               Current Medical History  Patient Admitting Diagnosis: CVA History of Present Illness: Jeremy Fritz is a 42 y.o. male with medical history significant of essential hypertension, polysubstance abuse with tobacco/THC/cocaine who [facility] to [facility] ED on 11/2922 with left arm weakness.  Patient reports onset roughly 1 week prior to admission, he reports that awoke from sleep and went to the fridge and tried to get a drink out and dropped it to the floor.  Patient did not seek care until his mother noticed his symptoms. Patient  endorses continued tobacco abuse.  He also reports that he has a history of high cholesterol currently not treated.  Also family history of diabetes, heart disease, hyperlipidemia.  Patient denied headache, no dizziness, no chest pain, no palpitations, no shortness of breath, no abdominal pain, no fever/chills/night sweats, no nausea/vomiting/diarrhea, no paresthesias. Mother reports history of crack cocaine use. UDS positive for THC and cocaine. In the ED, temperature 98.6 F, HR 99, RR 18, BP 145/97, SpO2 100% room air.  Sodium 141, potassium 3.9, chloride 107, CO2 22, glucose 110, BUN 19, creatinine 0.96.  AST 31, ALT 22, total bilirubin 0.5.  WBC 5.6, hemoglobin 12.2, platelet count 267.  INR 1.1.  EtOH level less than 10.  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 MRI: Moderate-to-large area of acute infarct in the right MCA distribution with associated edema and regional sulcal effacement but no midline shift or definite hemorrhage.  2D Echo w/bubble: EF 25 to 30%, global hypokinesis, no LV thrombus. TEE recommended to evaluate for LV thrombus. LDL 109, HgbA1c 5.9. Pt.  Also found to have neurosyphilis. Pt. Refused LP, ID recommended IV penicillin. They plan for IV penicillin G through  01/09/2023 to complete 2 weeks of therapy and then give a dose of Bicillin on 01/10/2023  Pt. Seen by PT, OT, SLP and they recommend CIR to assist return to PLOF.  Complete NIHSS TOTAL: 5   Patient's medical record from [facility] has been reviewed by the rehabilitation admission coordinator and physician.   Past Medical History      Past Medical History:  Diagnosis Date   Narcotic abuse in remission [facility])        Has the patient had major surgery during 100 days prior to admission? No   Family History   family history includes  Heart failure in his father; Hyperlipidemia in his mother; Hypertension in his mother.   Current Medications   Current Facility-Administered Medications:    acetaminophen (TYLENOL) tablet 650 mg, 650 mg, Oral, Q4H PRN, 650 mg at 12/29/22 0934 **OR** acetaminophen (TYLENOL) 160 MG/5ML solution 650 mg, 650 mg, Per Tube, Q4H PRN **OR** acetaminophen (TYLENOL) suppository 650 mg, 650 mg, Rectal, Q4H PRN, Stanford Breed, Denice Bors, MD   aspirin EC tablet 81 mg, 81 mg, Oral, Daily, Stanford Breed, Denice Bors, MD, 81 mg at 12/31/22 0941   atorvastatin (LIPITOR) tablet 40 mg, 40 mg, Oral, Daily, Lelon Perla, MD, 40 mg at 12/31/22 0943   carvedilol (COREG) tablet 6.25 mg, 6.25 mg, Oral, BID WC, Tobb, Kardie, DO   clopidogrel (PLAVIX) tablet 75 mg, 75 mg, Oral, Daily, Stanford Breed, Denice Bors, MD, 75 mg at 12/31/22 0943   empagliflozin (JARDIANCE) tablet 10 mg, 10 mg, Oral, Daily, Lelon Perla, MD, 10 mg at 12/31/22 0943   enoxaparin (LOVENOX) injection 40 mg, 40 mg, Subcutaneous, Q24H, Crenshaw, Denice Bors, MD, 40 mg at 12/30/22 2115   levalbuterol (XOPENEX) nebulizer solution 0.63 mg, 0.63 mg, Nebulization, Q6H PRN, Lelon Perla, MD, 0.63 mg at 12/28/22 1830   losartan (COZAAR) tablet 12.5 mg, 12.5 mg, Oral, Daily, Lelon Perla, MD, 12.5 mg at 12/31/22 0941   nicotine (NICODERM CQ - dosed in mg/24 hours) patch 21 mg, 21 mg, Transdermal, Daily, Stanford Breed, Denice Bors, MD   [START ON 01/10/2023] penicillin g benzathine (BICILLIN LA) 1200000 UNIT/2ML injection 2.4 Million Units, 2.4 Million Units, Intramuscular, Once, Wallace, Andrew N, DO   penicillin G potassium 12 Million Units in dextrose 5 % 500 mL CONTINUOUS infusion, 12 Million Units, Intravenous, Q12H, Mignon Pine, DO, Last Rate: 41.7 mL/hr at 12/31/22 0216, 12 Million Units at 12/31/22 0216   senna-docusate (Senokot-S) tablet 1 tablet, 1 tablet, Oral, QHS PRN, Lelon Perla, MD   traZODone (DESYREL) tablet 25 mg, 25 mg, Oral, QHS PRN, Lelon Perla, MD, 25 mg at 12/28/22 2108   Patients Current Diet:  Diet Order                  Diet Heart Room service appropriate? Yes; Fluid consistency: Thin; Fluid restriction: 1800 mL Fluid  Diet effective now                         Precautions / Restrictions Precautions Precautions: Fall Precaution Comments: L inattention Restrictions Weight Bearing Restrictions: No    Has the patient had 2 or more falls or a fall with injury in the past year? No   Prior Activity Level Community (5-7x/wk): Pt. active in the community PTA   Prior Functional Level Self Care: Did the patient need help bathing, dressing, using the toilet or eating? Independent   Indoor Mobility: Did the  patient need assistance with walking from room to room (with or without device)? Independent   Stairs: Did the patient need assistance with internal or external stairs (with or without device)? Independent   Functional Cognition: Did the patient need help planning regular tasks such as shopping or remembering to take medications? Independent   Patient Information Are you of Hispanic, Latino/a,or Spanish origin?: A. No, not of Hispanic, Latino/a, or Spanish origin What is your race?: B. Black or African American Do you need or want an interpreter to communicate with a doctor or health care staff?: 0. No   Patient's Response To:  Health Literacy and Transportation Is the patient able to respond to health literacy and transportation needs?: Yes Health Literacy - How often do you need to have someone help you when you read instructions, pamphlets, or other written material from your doctor or pharmacy?: Never In the past 12 months, has lack of transportation kept you from medical appointments or from getting medications?: No In the past 12 months, has lack of transportation kept you from meetings, work, or from getting things needed for daily living?: No   Development worker, international aid / [place]  Devices/Equipment: None Home Equipment: Grab bars - toilet   Prior Device Use: Indicate devices/aids used by the patient prior to current illness, exacerbation or injury? None of the above   Current Functional Level Cognition   Arousal/Alertness: Awake/alert Overall Cognitive Status: Impaired/Different from baseline Current Attention Level: Selective Orientation Level: Oriented X4 Following Commands: Follows one step commands consistently, Follows one step commands with increased time, Follows multi-step commands inconsistently Safety/Judgement: Decreased awareness of safety, Decreased awareness of deficits General Comments: pt with increased awareness and safety today, continues to requires cueing to slow down and attend to L side Attention: Sustained Sustained Attention: Impaired Sustained Attention Impairment: Verbal complex Memory: Impaired Memory Impairment: Decreased recall of new information Awareness: Impaired Awareness Impairment: Intellectual impairment, Emergent impairment, Anticipatory impairment Problem Solving: Impaired Problem Solving Impairment: Verbal complex Behaviors: Impulsive, Poor frustration tolerance Safety/Judgment: Impaired    Extremity Assessment (includes Sensation/Coordination)   Upper Extremity Assessment: Defer to OT evaluation LUE Deficits / Details: grossly 3-/5 MMT, poor coordination, proprioception and sensation.  POOR awareness to UE. LUE Sensation: decreased light touch, decreased proprioception LUE Coordination: decreased fine motor, decreased gross motor  Lower Extremity Assessment: LLE deficits/detail LLE Deficits / Details: MMT scores of 4- hip flexion, 4+ knee extension, 4 ankle dorsiflexion; denies numbness/tingling, but based on functional mobility he appears to have some sensation deficits; proprioception impaired; incoordination noted; poor awareness to L leg LLE Sensation: decreased proprioception, decreased light touch LLE  Coordination: decreased fine motor, decreased gross motor     ADLs   Overall ADL's : Needs assistance/impaired Eating/Feeding Details (indicate cue type and reason): cueing to use L hand as stabilizer to open containers Grooming: Minimal assistance, Wash/dry hands, Standing Grooming Details (indicate cue type and reason): cueing to use L hand, functional reaching crossbody; grasping trash with L hand to throw away Upper Body Dressing : Minimal assistance, Standing Lower Body Dressing: Minimal assistance, Sit to/from stand Toilet Transfer: Minimal assistance, Ambulation Toilet Transfer Details (indicate cue type and reason): simulated in room Toileting- Clothing Manipulation and Hygiene: Minimal assistance, Sit to/from stand Functional mobility during ADLs: Minimal assistance, Cueing for safety General ADL Comments: cueing to attend to L side, avoid obstacles     Mobility   Overal bed mobility: Needs Assistance Bed Mobility: Supine to Sit Supine to sit: Min assist, HOB  elevated General bed mobility comments: EOB upon entry     Transfers   Overall transfer level: Needs assistance Equipment used: None Transfers: Sit to/from Stand Sit to Stand: Min guard General transfer comment: to steady with cueing for L sided awareness     Ambulation / Gait / Stairs / Wheelchair Mobility   Ambulation/Gait Ambulation/Gait assistance: Herbalist (Feet): 200 Feet (50", 200") Assistive device: None Gait Pattern/deviations: Step-through pattern, Decreased step length - left, Drifts right/left General Gait Details: Circumduction with LLE in swing phase, tendency for left drift with min-mod multimodal cues and frequent pauses for increased L attention and awareness. pt running into 1 obstacle Gait velocity: decreased Gait velocity interpretation: >2.62 ft/sec, indicative of community ambulatory Stairs: Yes Stairs assistance: Min guard Stair Management: Two rails Number of Stairs: 20      Posture / Balance Dynamic Sitting Balance Sitting balance - Comments: Poor trunk coordination, impulsive quick movements with poor control at EOB, minA to prevent LOB intermittently. Balance Overall balance assessment: Needs assistance Sitting-balance support: No upper extremity supported, Feet supported Sitting balance-Leahy Scale: Good Sitting balance - Comments: Poor trunk coordination, impulsive quick movements with poor control at EOB, minA to prevent LOB intermittently. Standing balance support: During functional activity, No upper extremity supported Standing balance-Leahy Scale: Fair Standing balance comment: reaching for R UE support,     Special needs/care consideration Special service needs IV ABX until 3/15    Previous Home Environment (from acute therapy documentation) Living Arrangements: Non-relatives/Friends  Lives With: Friend(s) Available Help at Discharge: Friend(s) Type of Home: Mobile home Home Layout: One level Home Access: Stairs to enter Entrance Stairs-Rails: Can reach both Entrance Stairs-Number of Steps: 2 Bathroom Shower/Tub: Multimedia programmer: Standard Bathroom Accessibility: Yes How Accessible: Accessible via walker, Accessible via wheelchair [place]: No   Discharge Living Setting Plans for Discharge Living Setting: Patient's home Type of Home at Discharge: [place]: One level Discharge Home Access: Stairs to enter Entrance Stairs-Rails: Left, Right, Can reach both Entrance Stairs-Number of Steps: 2 Discharge Bathroom Shower/Tub: Walk-in shower Discharge Bathroom Toilet: Standard Discharge Bathroom Accessibility: Yes How Accessible: Accessible via walker, Accessible via wheelchair Does the patient have any problems obtaining your medications?: No   Social/Family/Support Systems Patient Roles: Parent Contact Information: [phone] Anticipated Caregiver: [place] Anticipated Caregiver's  Contact Information: Can provide Min-mod A, works as a caregiver Ability/Limitations of Caregiver: 24/7 min-mod A Caregiver Availability: 24/7 Discharge Plan Discussed with Primary Caregiver: Yes Is Caregiver In Agreement with Plan?: Yes Does Caregiver/Family have Issues with Lodging/Transportation while Pt is in Rehab?: No   Goals Patient/Family Goal for Rehab: PT/OT/SLP Supervision Expected length of stay: 7-10  days Pt/Family Agrees to Admission and willing to participate: Yes Program Orientation Provided & Reviewed with Pt/Caregiver Including Roles  & Responsibilities: Yes   Decrease burden of Care through IP rehab admission: not anticipated   Possible need for SNF placement upon discharge: not anticipated    Patient Condition: I have reviewed medical records from [facility], spoken with CM, and patient and family member. I met with patient at the bedside for inpatient rehabilitation assessment.  Patient will benefit from ongoing PT, OT, and SLP, can actively participate in 3 hours of therapy a day 5 days of the week, and can make measurable gains during the admission.  Patient will also benefit from the coordinated team approach during an Inpatient Acute Rehabilitation admission.  The patient will receive intensive therapy as well  as Rehabilitation physician, nursing, social worker, and care management interventions.  Due to safety, skin/wound care, disease management, medication administration, pain management, and patient education the patient requires 24 hour a day rehabilitation nursing.  The patient is currently min A with mobility and basic ADLs.  Discharge setting and therapy post discharge at home with home health is anticipated.  Patient has agreed to participate in the Acute Inpatient Rehabilitation Program and will admit today.   Preadmission Screen Completed By:  Genella Mech, 12/31/2022 1:01  PM ______________________________________________________________________   Discussed status with Dr. Ranell Patrick  on 12/31/22 at 38 and received approval for admission today.   Admission Coordinator:  Genella Mech, CCC-SLP, time 1300/Date 12/31/22    Assessment/Plan: Diagnosis: Does the need for close, 24 hr/day Medical supervision in concert with the patient's rehab needs make it unreasonable for this patient to be served in a less intensive setting? Yes Co-Morbidities requiring supervision/potential complications: cocaine abuse, HTN, neurosyphilis, hyperglycemia, hypocalcemia Due to bladder management, bowel management, safety, skin/wound care, disease management, medication administration, pain management, and patient education, does the patient require 24 hr/day rehab nursing? Yes Does the patient require coordinated care of a physician, rehab nurse, PT, OT to address physical and functional deficits in the context of the above medical diagnosis(es)? Yes Addressing deficits in the following areas: balance, endurance, locomotion, strength, transferring, bowel/bladder control, bathing, dressing, feeding, grooming, and toileting Can the patient actively participate in an intensive therapy program of at least 3 hrs of therapy 5 days a week? Yes The potential for patient to make measurable gains while on inpatient rehab is excellent Anticipated functional outcomes upon discharge from inpatient rehab: modified independent PT, modified independent OT, independent SLP Estimated rehab length of stay to reach the above functional goals is: 5-7 days Anticipated discharge destination: Home 10. Overall Rehab/Functional Prognosis: excellent     MD Signature: Leeroy Cha, MD

## 2022-12-31 NOTE — Progress Notes (Signed)
Inpatient Rehab Admissions Coordinator:    I have a CIR bed for this pt. RN may call report to [place], [place], [place] Admissions Coordinator  [phone] ([place]) [phone] (office)

## 2022-12-31 NOTE — Plan of Care (Signed)
  Problem: Education: Goal: Knowledge of disease or condition will improve Outcome: Progressing Goal: Knowledge of patient specific risk factors will improve Jeremy Fritz N/A or DELETE if not current risk factor) Outcome: Progressing   Problem: Coping: Goal: Will verbalize positive feelings about self Outcome: Progressing Goal: Will identify appropriate support needs Outcome: Progressing   Problem: Health Behavior/Discharge Planning: Goal: Ability to manage health-related needs will improve Outcome: Progressing   Problem: Self-Care: Goal: Ability to communicate needs accurately will improve Outcome: Progressing   Problem: Nutrition: Goal: Risk of aspiration will decrease Outcome: Progressing Goal: Dietary intake will improve Outcome: Progressing   Problem: Pain Managment: Goal: General experience of comfort will improve Outcome: Progressing   Problem: Skin Integrity: Goal: Risk for impaired skin integrity will decrease Outcome: Progressing

## 2022-12-31 NOTE — Progress Notes (Signed)
Inpatient Rehabilitation Admission Medication Review by a Pharmacist  A complete drug regimen review was completed for this patient to identify any potential clinically significant medication issues.  High Risk Drug Classes Is patient taking? Indication by Medication  Antipsychotic Yes Compazine- N/V  Anticoagulant Yes Lovenox- vte ppx  Antibiotic Yes, as an intravenous medication Penicillin- neurosyphilis End date: 01/09/2023 Bicillin dose on 01/10/2023  Opioid No   Antiplatelet Yes Aspirin/Plavix x 76month f/b asa alone- CVA ppx  Hypoglycemics/insulin Yes Dapagliflozin- HF  Vasoactive Medication Yes Coreg - HF  Chemotherapy No   Other Yes Acetaminophen -mild pain Diphenhydramine- itching Lipitor- HLD Losartan- HF Nicoderm - tobacco cessation Trazodone- sleep     Type of Medication Issue Identified Description of Issue Recommendation(s)  Drug Interaction(s) (clinically significant)     Duplicate Therapy     Allergy     No Medication Administration End Date  Plavix for 3 months, end date: 03/24/2023  End date added   Incorrect Dose     Additional Drug Therapy Needed      Significant med changes from prior encounter (inform family/care partners about these prior to discharge). NEW aspirin, atorvastatin, carvedilol, clopidogrel, empagliflozin, nicotine patch, penicillin  HELD Home albuterol, amlodipine DECREASED losartan dose  Review at discharge  Other        Clinically significant medication issues were identified that warrant physician communication and completion of prescribed/recommended actions by midnight of the next day:  No   Time spent performing this drug regimen review (minutes):  [place] PharmD, BOasis BSamaritan Pacific Communities HospitalClinical Pharmacist  Please check AMION for all MPepper Pikephone numbers After 10:00 PM, call [place]

## 2022-12-31 NOTE — Progress Notes (Signed)
Inpatient Rehabilitation Admission Medication Review by a Pharmacist  A complete drug regimen review was completed for this patient to identify any potential clinically significant medication issues.  High Risk Drug Classes Is patient taking? Indication by Medication  Antipsychotic Yes Compazine- N/V  Anticoagulant Yes Lovenox- vte ppx  Antibiotic Yes, as an intravenous medication Penicillin- neurosyphilis End date: 01/09/2023 Bicillin dose on 01/10/2023  Opioid No   Antiplatelet Yes Aspirin/Plavix x 74month f/b asa alone- CVA ppx Plavix end date: 04/01/2023  Hypoglycemics/insulin Yes Dapagliflozin- HF  Vasoactive Medication Yes Coreg, Entresto- HF  Chemotherapy No   Other Yes Lipitor- HLD     Type of Medication Issue Identified Description of Issue Recommendation(s)  Drug Interaction(s) (clinically significant)     Duplicate Therapy     Allergy     No Medication Administration End Date     Incorrect Dose     Additional Drug Therapy Needed  Bicillin Per ID, give one dose on 01/10/2023 after penicillin IV has completed (3/15- last dose)  Significant med changes from prior encounter (inform family/care partners about these prior to discharge).    Other  PTA meds: Norvasc Losartan Restart PTA meds when and if necessary during CIR admission or at time of discharge, if warranted    Clinically significant medication issues were identified that warrant physician communication and completion of prescribed/recommended actions by midnight of the next day:  No   Time spent performing this drug regimen review (minutes):  30   Earle Reome BS, PharmD, BCPS Clinical Pharmacist 12/31/2022 1:45 PM  Contact: [phone] 3 PM  "Be curious, not judgmental..." -WJamal Maes
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
## 2022-12-31 NOTE — Plan of Care (Signed)
Problem: Education: Goal: Knowledge of disease or condition will improve 12/31/2022 1550 by Charlestine Night, RN Outcome: Adequate for Discharge 12/31/2022 1339 by Charlestine Night, RN Outcome: Progressing Goal: Knowledge of secondary prevention will improve (MUST DOCUMENT ALL) Outcome: Adequate for Discharge Goal: Knowledge of patient specific risk factors will improve [place] N/A or DELETE if not current risk factor) 12/31/2022 1550 by Charlestine Night, RN Outcome: Adequate for Discharge 12/31/2022 1339 by Charlestine Night, RN Outcome: Progressing   Problem: Ischemic Stroke/TIA Tissue Perfusion: Goal: Complications of ischemic stroke/TIA will be minimized Outcome: Adequate for Discharge   Problem: Coping: Goal: Will verbalize positive feelings about self 12/31/2022 1550 by Charlestine Night, RN Outcome: Adequate for Discharge 12/31/2022 1339 by Charlestine Night, RN Outcome: Progressing Goal: Will identify appropriate support needs 12/31/2022 1550 by Charlestine Night, RN Outcome: Adequate for Discharge 12/31/2022 1339 by Charlestine Night, RN Outcome: Progressing   Problem: Health Behavior/Discharge Planning: Goal: Ability to manage health-related needs will improve 12/31/2022 1550 by Charlestine Night, RN Outcome: Adequate for Discharge 12/31/2022 1339 by Charlestine Night, RN Outcome: Progressing Goal: Goals will be collaboratively established with patient/family Outcome: Adequate for Discharge   Problem: Self-Care: Goal: Ability to participate in self-care as condition permits will improve Outcome: Adequate for Discharge Goal: Verbalization of feelings and concerns over difficulty with self-care will improve Outcome: Adequate for Discharge Goal: Ability to communicate needs accurately will improve 12/31/2022 1550 by Charlestine Night, RN Outcome: Adequate for Discharge 12/31/2022 1339 by Charlestine Night, RN Outcome: Progressing   Problem: Nutrition: Goal: Risk of aspiration will decrease 12/31/2022  1550 by Charlestine Night, RN Outcome: Adequate for Discharge 12/31/2022 1339 by Charlestine Night, RN Outcome: Progressing Goal: Dietary intake will improve 12/31/2022 1550 by Charlestine Night, RN Outcome: Adequate for Discharge 12/31/2022 1339 by Charlestine Night, RN Outcome: Progressing   Problem: Education: Goal: Knowledge of General Education information will improve Description: Including pain rating scale, medication(s)/side effects and non-pharmacologic comfort measures Outcome: Adequate for Discharge   Problem: Health Behavior/Discharge Planning: Goal: Ability to manage health-related needs will improve Outcome: Adequate for Discharge   Problem: Clinical Measurements: Goal: Ability to maintain clinical measurements within normal limits will improve Outcome: Adequate for Discharge Goal: Will remain free from infection Outcome: Adequate for Discharge Goal: Diagnostic test results will improve Outcome: Adequate for Discharge Goal: Respiratory complications will improve Outcome: Adequate for Discharge Goal: Cardiovascular complication will be avoided Outcome: Adequate for Discharge   Problem: Activity: Goal: Risk for activity intolerance will decrease Outcome: Adequate for Discharge   Problem: Nutrition: Goal: Adequate nutrition will be maintained Outcome: Adequate for Discharge   Problem: Coping: Goal: Level of anxiety will decrease Outcome: Adequate for Discharge   Problem: Elimination: Goal: Will not experience complications related to bowel motility Outcome: Adequate for Discharge Goal: Will not experience complications related to urinary retention Outcome: Adequate for Discharge   Problem: Pain Managment: Goal: General experience of comfort will improve 12/31/2022 1550 by Charlestine Night, RN Outcome: Adequate for Discharge 12/31/2022 1339 by Charlestine Night, RN Outcome: Progressing   Problem: Safety: Goal: Ability to remain free from injury will improve Outcome:  Adequate for Discharge   Problem: Skin Integrity: Goal: Risk for impaired skin integrity will decrease 12/31/2022 1550 by Charlestine Night, RN Outcome: Adequate for Discharge 12/31/2022 1339 by Charlestine Night, RN Outcome: Progressing   Problem: Education: Goal: Knowledge of disease or condition will improve 12/31/2022 1550 by Charlestine Night, RN Outcome: Adequate for Discharge 12/31/2022 1339 by Charlestine Night, RN Outcome: Progressing Goal: Knowledge of secondary prevention will improve (  MUST DOCUMENT ALL) Outcome: Adequate for Discharge Goal: Knowledge of patient specific risk factors will improve [place] N/A or DELETE if not current risk factor) 12/31/2022 1550 by Charlestine Night, RN Outcome: Adequate for Discharge 12/31/2022 1339 by Charlestine Night, RN Outcome: Progressing   Problem: Ischemic Stroke/TIA Tissue Perfusion: Goal: Complications of ischemic stroke/TIA will be minimized Outcome: Adequate for Discharge   Problem: Coping: Goal: Will verbalize positive feelings about self 12/31/2022 1550 by Charlestine Night, RN Outcome: Adequate for Discharge 12/31/2022 1339 by Charlestine Night, RN Outcome: Progressing Goal: Will identify appropriate support needs 12/31/2022 1550 by Charlestine Night, RN Outcome: Adequate for Discharge 12/31/2022 1339 by Charlestine Night, RN Outcome: Progressing   Problem: Health Behavior/Discharge Planning: Goal: Ability to manage health-related needs will improve 12/31/2022 1550 by Charlestine Night, RN Outcome: Adequate for Discharge 12/31/2022 1339 by Charlestine Night, RN Outcome: Progressing Goal: Goals will be collaboratively established with patient/family Outcome: Adequate for Discharge   Problem: Self-Care: Goal: Ability to participate in self-care as condition permits will improve Outcome: Adequate for Discharge Goal: Verbalization of feelings and concerns over difficulty with self-care will improve Outcome: Adequate for Discharge Goal: Ability to communicate  needs accurately will improve 12/31/2022 1550 by Charlestine Night, RN Outcome: Adequate for Discharge 12/31/2022 1339 by Charlestine Night, RN Outcome: Progressing   Problem: Nutrition: Goal: Risk of aspiration will decrease 12/31/2022 1550 by Charlestine Night, RN Outcome: Adequate for Discharge 12/31/2022 1339 by Charlestine Night, RN Outcome: Progressing Goal: Dietary intake will improve 12/31/2022 1550 by Charlestine Night, RN Outcome: Adequate for Discharge 12/31/2022 1339 by Charlestine Night, RN Outcome: Progressing   Problem: Education: Goal: Knowledge of General Education information will improve Description: Including pain rating scale, medication(s)/side effects and non-pharmacologic comfort measures Outcome: Adequate for Discharge   Problem: Health Behavior/Discharge Planning: Goal: Ability to manage health-related needs will improve Outcome: Adequate for Discharge   Problem: Clinical Measurements: Goal: Ability to maintain clinical measurements within normal limits will improve Outcome: Adequate for Discharge Goal: Will remain free from infection Outcome: Adequate for Discharge Goal: Diagnostic test results will improve Outcome: Adequate for Discharge Goal: Respiratory complications will improve Outcome: Adequate for Discharge Goal: Cardiovascular complication will be avoided Outcome: Adequate for Discharge   Problem: Activity: Goal: Risk for activity intolerance will decrease Outcome: Adequate for Discharge   Problem: Nutrition: Goal: Adequate nutrition will be maintained Outcome: Adequate for Discharge   Problem: Coping: Goal: Level of anxiety will decrease Outcome: Adequate for Discharge   Problem: Elimination: Goal: Will not experience complications related to bowel motility Outcome: Adequate for Discharge Goal: Will not experience complications related to urinary retention Outcome: Adequate for Discharge   Problem: Pain Managment: Goal: General experience of  comfort will improve 12/31/2022 1550 by Charlestine Night, RN Outcome: Adequate for Discharge 12/31/2022 1339 by Charlestine Night, RN Outcome: Progressing   Problem: Safety: Goal: Ability to remain free from injury will improve Outcome: Adequate for Discharge   Problem: Skin Integrity: Goal: Risk for impaired skin integrity will decrease 12/31/2022 1550 by Charlestine Night, RN Outcome: Adequate for Discharge 12/31/2022 1339 by Charlestine Night, RN Outcome: Progressing

## 2022-12-31 NOTE — Progress Notes (Signed)
Progress Note  Patient Name: Jeremy Fritz Date of Encounter: 12/31/2022  Primary Cardiologist: Shelva Majestic, MD   Subjective   Patient seen examined at his bedside.  Nurse at bedside.  Patient aware of procedure today.  He offers no complaints at this time.  Scheduled Meds:  aspirin EC  81 mg Oral Daily   atorvastatin  40 mg Oral Daily   carvedilol  3.125 mg Oral BID WC   clopidogrel  75 mg Oral Daily   empagliflozin  10 mg Oral Daily   enoxaparin (LOVENOX) injection  40 mg Subcutaneous Q24H   losartan  12.5 mg Oral Daily   nicotine  21 mg Transdermal Daily   Continuous Infusions:  penicillin G potassium 12 Million Units in dextrose 5 % 500 mL CONTINUOUS infusion 12 Million Units (12/31/22 0216)   PRN Meds: acetaminophen **OR** acetaminophen (TYLENOL) oral liquid 160 mg/5 mL **OR** acetaminophen, levalbuterol, senna-docusate, traZODone   Vital Signs    Vitals:   12/30/22 1824 12/30/22 1946 12/30/22 2356 12/31/22 0321  BP: 122/68 114/87 119/86 (!) 129/98  Pulse: 88 (!) 101 91 97  Resp: '17 20 20 '$ (!) 21  Temp: 98 F (36.7 C) 98.5 F (36.9 C) (!) 97.5 F (36.4 C) 98.4 F (36.9 C)  TempSrc: Oral Oral Oral Oral  SpO2: 100% 99% 98% 98%  Weight:    72.8 kg  Height:        Intake/Output Summary (Last 24 hours) at 12/31/2022 0956 Last data filed at 12/31/2022 [id number] Gross per 24 hour  Intake --  Output 1550 ml  Net -1550 ml    Filed Weights   12/25/22 1033 12/30/22 0500 12/31/22 0321  Weight: 74.8 kg 75.1 kg 72.8 kg    Telemetry    Sinus rhythm- Personally Reviewed  ECG    None today- Personally Reviewed  Physical Exam    General: Comfortable, lying in bed. Head: Atraumatic, normal size  Eyes: PEERLA, EOMI  Neck: Supple, normal JVD Cardiac: Normal S1, S2; RRR; no murmurs, rubs, or gallops Lungs: Clear to auscultation bilaterally Abd: Soft, nontender, no hepatomegaly  Ext: warm, no edema Musculoskeletal: No deformities, did not perform full neurological  exam. Skin: Warm and dry, no rashes, tattoos Neuro: Alert and oriented to person, place, time, and situation, did not perform full neurological exam. Psych: Normal mood and affect   Labs    Chemistry Recent Labs  Lab 12/25/22 1237 12/25/22 1329 12/29/22 0456  NA 142 141 137  K 3.7 3.9 3.9  CL 105 107 106  CO2  --  22 25  GLUCOSE 110* 110* 121*  BUN '20 19 15  '$ CREATININE 1.10 0.96 1.07  CALCIUM  --  8.8* 8.5*  PROT  --  6.6  --   ALBUMIN  --  2.8*  --   AST  --  31  --   ALT  --  22  --   ALKPHOS  --  50  --   BILITOT  --  0.5  --   GFRNONAA  --  >60 >60  ANIONGAP  --  12 6     Hematology Recent Labs  Lab 12/25/22 1237 12/25/22 1329 12/29/22 0456  WBC  --  5.6 8.6  RBC  --  4.21* 4.45  HGB 12.6* 12.2* 13.2  HCT 37.0* 37.0* 37.7*  MCV  --  87.9 84.7  MCH  --  29.0 29.7  MCHC  --  33.0 35.0  RDW  --  15.3 15.1  PLT  --  267 297    Cardiac EnzymesNo results for input(s): "TROPONINI" in the last 168 hours. No results for input(s): "TROPIPOC" in the last 168 hours.   BNP Recent Labs  Lab 12/28/22 1417  BNP 376.4*     DDimer No results for input(s): "DDIMER" in the last 168 hours.   Radiology    ECHO TEE  Result Date: 12/30/2022    TRANSESOPHOGEAL ECHO REPORT   Patient Name:   Jeremy Fritz Date of Exam: 12/30/2022 Medical Rec #:  [id number]       Height:       72.0 in Accession #:    [id number]      Weight:       165.6 lb Date of Birth:  [birth year]      BSA:          1.966 m Patient Age:    42 years        BP:           132/106 mmHg Patient Gender: M               HR:           105 bpm. Exam Location:  Inpatient Procedure: Transesophageal Echo, Color Doppler, Cardiac Doppler and Saline            Contrast Bubble Study Indications:     Stroke i63.9  History:         Patient has prior history of Echocardiogram examinations, most                  recent 12/26/2022. Risk Factors:Hypertension and Polysubstance                  Abuse.  Sonographer:     Raquel Sarna Senior RDCS  Referring Phys:  Cecilie Kicks, R Diagnosing Phys: Kirk Ruths MD PROCEDURE: After discussion of the risks and benefits of a TEE, an informed consent was obtained from the patient. The transesophogeal probe was passed without difficulty through the esophogus of the patient. Sedation performed by different physician. The patient was monitored while under deep sedation. Anesthestetic sedation was provided intravenously by Anesthesiology: '233mg'$  of Propofol, '100mg'$  of Lidocaine. The patient developed no complications during the procedure.  IMPRESSIONS  1. No source of embolus identified.  2. Left ventricular ejection fraction, by estimation, is 20 to 25%. The left ventricle has severely decreased function. The left ventricle demonstrates global hypokinesis. The left ventricular internal cavity size was moderately dilated.  3. Right ventricular systolic function is moderately reduced. The right ventricular size is normal.  4. Left atrial size was mildly dilated. No left atrial/left atrial appendage thrombus was detected.  5. Right atrial size was mildly dilated.  6. The mitral valve is normal in structure. Moderate mitral valve regurgitation.  7. The aortic valve is tricuspid. Aortic valve regurgitation is not visualized.  8. Agitated saline contrast bubble study was negative, with no evidence of any interatrial shunt. FINDINGS  Left Ventricle: Left ventricular ejection fraction, by estimation, is 20 to 25%. The left ventricle has severely decreased function. The left ventricle demonstrates global hypokinesis. The left ventricular internal cavity size was moderately dilated. Right Ventricle: The right ventricular size is normal. Right ventricular systolic function is moderately reduced. Left Atrium: Left atrial size was mildly dilated. No left atrial/left atrial appendage thrombus was detected. Right Atrium: Right atrial size was mildly dilated. Pericardium: There is no evidence of pericardial effusion. Mitral Valve:  The mitral valve  is normal in structure. Moderate mitral valve regurgitation. Tricuspid Valve: The tricuspid valve is normal in structure. Tricuspid valve regurgitation is trivial. Aortic Valve: The aortic valve is tricuspid. Aortic valve regurgitation is not visualized. Aortic valve mean gradient measures 2.0 mmHg. Aortic valve peak gradient measures 3.8 mmHg. Pulmonic Valve: The pulmonic valve was normal in structure. Pulmonic valve regurgitation is trivial. Aorta: The aortic root is normal in size and structure. There is minimal (Grade I) plaque involving the descending aorta. IAS/Shunts: No atrial level shunt detected by color flow Doppler. Agitated saline contrast was given intravenously to evaluate for intracardiac shunting. Agitated saline contrast bubble study was negative, with no evidence of any interatrial shunt. Additional Comments: No source of embolus identified. Spectral Doppler performed. AORTIC VALVE AV Vmax:      97.30 cm/s AV Vmean:     64.200 cm/s AV VTI:       0.155 m AV Peak Grad: 3.8 mmHg AV Mean Grad: 2.0 mmHg  AORTA Ao Root diam: 3.10 cm Ao Asc diam:  2.80 cm Kirk Ruths MD Electronically signed by Kirk Ruths MD Signature Date/Time: 12/30/2022/1:04:05 PM    Final     Cardiac Studies   TTE 12/26/2022 IMPRESSIONS     1. Left ventricular ejection fraction, by estimation, is 25 to 30%. The  left ventricle has severely decreased function. The left ventricle  demonstrates global hypokinesis. The left ventricular internal cavity size  was moderately dilated. There is severe   left ventricular hypertrophy of the inferior segment. Left ventricular  diastolic function could not be evaluated. There is no LV thrombus  visualized.   2. Right ventricular systolic function mildly reduced with normal basal  excursion. The right ventricular size is mild to moderately. Tricuspid  regurgitation signal is inadequate for assessing PA pressure.   3. Left atrial size was mildly dilated.   4.  Right atrial size was mildly dilated.   5. A small pericardial effusion is present. The pericardial effusion is  circumferential.   6. MR mechanism appears ventricular functional in nature, with blunting  of the right sided pulmonary vein Doppler. The mitral valve is normal in  structure. Moderate to severe mitral valve regurgitation.   7. The aortic valve is tricuspid. Aortic valve regurgitation is not  visualized. No aortic stenosis is present.   8. The inferior vena cava is dilated in size with <50% respiratory  variability, suggesting right atrial pressure of 15 mmHg.   FINDINGS   Left Ventricle: Left ventricular ejection fraction, by estimation, is 25  to 30%. The left ventricle has severely decreased function. The left  ventricle demonstrates global hypokinesis. The left ventricular internal  cavity size was moderately dilated.  There is severe left ventricular hypertrophy of the inferior segment. Left  ventricular diastolic function could not be evaluated due to mitral  regurgitation (moderate or greater). Left ventricular diastolic function  could not be evaluated.   Right Ventricle: The right ventricular size is mild to moderately. No  increase in right ventricular wall thickness. Right ventricular systolic  function mildly reduced with normal basal excursion. Tricuspid  regurgitation signal is inadequate for assessing   PA pressure.   Left Atrium: Left atrial size was mildly dilated.   Right Atrium: Right atrial size was mildly dilated.   Pericardium: A small pericardial effusion is present. The pericardial  effusion is circumferential.   Mitral Valve: MR mechanism appears ventricular functional in nature, with  blunting of the right sided pulmonary vein Doppler. The mitral valve  is  normal in structure. Moderate to severe mitral valve regurgitation.   Tricuspid Valve: The tricuspid valve is normal in structure. Tricuspid  valve regurgitation is not demonstrated. No  evidence of tricuspid  stenosis.   Aortic Valve: The aortic valve is tricuspid. Aortic valve regurgitation is  not visualized. No aortic stenosis is present. Aortic valve mean gradient  measures 4.0 mmHg. Aortic valve peak gradient measures 7.1 mmHg. Aortic  valve area, by VTI measures 2.40  cm.   Pulmonic Valve: The pulmonic valve was normal in structure. Pulmonic valve  regurgitation is not visualized. No evidence of pulmonic stenosis.   Aorta: The aortic root and ascending aorta are structurally normal, with  no evidence of dilitation.   Venous: The inferior vena cava is dilated in size with less than 50%  respiratory variability, suggesting right atrial pressure of 15 mmHg.   IAS/Shunts: No atrial level shunt detected by color flow Doppler.     Patient Profile     42 y.o. male with polysubstance abuse and acute ischemic right MCA stroke and newly diagnosed severe left ventricular systolic function EF 20 to 25%.  Assessment & Plan    Acute ischemic right MCA stroke ([place]) Cocaine abuse ([place]) Severely depressed ejection fraction EF 25 to 30%. Moderate to severe mitral regurgitation Obstructive sleep apnea. 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   Still with diastolic hypertension-will increase Coreg to 6.25 mg twice daily.  Continue on current medical therapy on carvedilol, losartan we will plan to transition to [facility] prior to discharge, Jardiance 10 mg daily.  In terms of his moderate to severe mitral regurgitation suspect secondary in the setting of his severe LV dysfunction.  Will continue to monitor.  Will reassess this once his echo has been repeated in the outpatient setting.  Status post TEE yesterday with no evidence of any clots or vegetation.  His blood pressure is acceptable today.  Will continue the current regimen and adjust as appropriate.  OSA-will need CPAP.  Polysubstance abuse  will need to be evaluated by the case management but will defer to the primary team for next steps.      For questions or updates, please contact [facility] Please consult [email or web address] for contact info under Cardiology/STEMI.      Signed, Berniece Salines, DO  12/31/2022, 9:56 AM
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
## 2022-12-31 NOTE — Progress Notes (Signed)
Inpatient Rehabilitation Admission Medication Review by a Pharmacist  A complete drug regimen review was completed for this patient to identify any potential clinically significant medication issues.  High Risk Drug Classes Is patient taking? Indication by Medication  Antipsychotic Yes Compazine- N/V  Anticoagulant Yes Lovenox- vte ppx  Antibiotic Yes, as an intravenous medication Penicillin- neurosyphilis End date: 01/09/2023 Bicillin dose on 01/10/2023  Opioid No   Antiplatelet Yes Aspirin/Plavix x 13month f/b asa alone- CVA ppx Plavix end date: 04/01/2023  Hypoglycemics/insulin Yes Jardiance- HF  Vasoactive Medication Yes Coreg, Cozaar- HF  Chemotherapy No   Other Yes Lipitor- HLD Trazodone- sleep     Type of Medication Issue Identified Description of Issue Recommendation(s)  Drug Interaction(s) (clinically significant)     Duplicate Therapy     Allergy     No Medication Administration End Date     Incorrect Dose     Additional Drug Therapy Needed  Bicillin Per ID, give one dose on 01/10/2023 after penicillin IV has completed (3/15- last dose)  Significant med changes from prior encounter (inform family/care partners about these prior to discharge).    Other  PTA meds: Losartan Restart PTA meds when and if necessary during CIR admission or at time of discharge, if warranted    Clinically significant medication issues were identified that warrant physician communication and completion of prescribed/recommended actions by midnight of the next day:  No   Time spent performing this drug regimen review (minutes):  30   Jeremy Fritz BS, PharmD, BCPS Clinical Pharmacist 01/01/2023 5:55 AM  Contact: [phone] 3 PM  "Be curious, not judgmental..." -WJamal Maes

## 2023-01-01 ENCOUNTER — Encounter (HOSPITAL_COMMUNITY): Payer: Self-pay | Admitting: Cardiology

## 2023-01-01 DIAGNOSIS — I63511 Cerebral infarction due to unspecified occlusion or stenosis of right middle cerebral artery: Secondary | ICD-10-CM | POA: Diagnosis not present

## 2023-01-01 LAB — CBC WITH DIFFERENTIAL/PLATELET
Abs Immature Granulocytes: 0.04 10*3/uL (ref 0.00–0.07)
Basophils Absolute: 0.1 10*3/uL (ref 0.0–0.1)
Basophils Relative: 1 %
Eosinophils Absolute: 0.1 10*3/uL (ref 0.0–0.5)
Eosinophils Relative: 1 %
HCT: 38 % — ABNORMAL LOW (ref 39.0–52.0)
Hemoglobin: 13.2 g/dL (ref 13.0–17.0)
Immature Granulocytes: 1 %
Lymphocytes Relative: 17 %
Lymphs Abs: 1.5 10*3/uL (ref 0.7–4.0)
MCH: 29.4 pg (ref 26.0–34.0)
MCHC: 34.7 g/dL (ref 30.0–36.0)
MCV: 84.6 fL (ref 80.0–100.0)
Monocytes Absolute: 0.6 10*3/uL (ref 0.1–1.0)
Monocytes Relative: 6 %
Neutro Abs: 6.6 10*3/uL (ref 1.7–7.7)
Neutrophils Relative %: 74 %
Platelets: 340 10*3/uL (ref 150–400)
RBC: 4.49 MIL/uL (ref 4.22–5.81)
RDW: 14.9 % (ref 11.5–15.5)
WBC: 8.9 10*3/uL (ref 4.0–10.5)
nRBC: 0 % (ref 0.0–0.2)

## 2023-01-01 LAB — COMPREHENSIVE METABOLIC PANEL
ALT: 18 U/L (ref 0–44)
AST: 24 U/L (ref 15–41)
Albumin: 2.4 g/dL — ABNORMAL LOW (ref 3.5–5.0)
Alkaline Phosphatase: 40 U/L (ref 38–126)
Anion gap: 9 (ref 5–15)
BUN: 13 mg/dL (ref 6–20)
CO2: 22 mmol/L (ref 22–32)
Calcium: 8.6 mg/dL — ABNORMAL LOW (ref 8.9–10.3)
Chloride: 105 mmol/L (ref 98–111)
Creatinine, Ser: 0.99 mg/dL (ref 0.61–1.24)
GFR, Estimated: >60 mL/min (ref >60)
Glucose, Bld: 187 mg/dL — ABNORMAL HIGH (ref 70–99)
Potassium: 3.7 mmol/L (ref 3.5–5.1)
Sodium: 136 mmol/L (ref 135–145)
Total Bilirubin: 0.3 mg/dL (ref 0.3–1.2)
Total Protein: 6.3 g/dL — ABNORMAL LOW (ref 6.5–8.1)
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
## 2023-01-01 NOTE — Plan of Care (Signed)
  Problem: RH Balance Goal: LTG Patient will maintain dynamic standing with ADLs (OT) Description: LTG:  Patient will maintain dynamic standing balance with assist during activities of daily living (OT)  Flowsheets (Taken 01/01/2023 1231) LTG: Pt will maintain dynamic standing balance during ADLs with: Independent   Problem: Sit to Stand Goal: LTG:  Patient will perform sit to stand in prep for activites of daily living with assistance level (OT) Description: LTG:  Patient will perform sit to stand in prep for activites of daily living with assistance level (OT) Flowsheets (Taken 01/01/2023 1231) LTG: PT will perform sit to stand in prep for activites of daily living with assistance level: Independent   Problem: RH Bathing Goal: LTG Patient will bathe all body parts with assist levels (OT) Description: LTG: Patient will bathe all body parts with assist levels (OT) Flowsheets (Taken 01/01/2023 1231) LTG: Pt will perform bathing with assistance level/cueing: Independent   Problem: RH Dressing Goal: LTG Patient will perform upper body dressing (OT) Description: LTG Patient will perform upper body dressing with assist, with/without cues (OT). Flowsheets (Taken 01/01/2023 1231) LTG: Pt will perform upper body dressing with assistance level of: Independent Goal: LTG Patient will perform lower body dressing w/assist (OT) Description: LTG: Patient will perform lower body dressing with assist, with/without cues in positioning using equipment (OT) Flowsheets (Taken 01/01/2023 1231) LTG: Pt will perform lower body dressing with assistance level of: Independent   Problem: RH Toileting Goal: LTG Patient will perform toileting task (3/3 steps) with assistance level (OT) Description: LTG: Patient will perform toileting task (3/3 steps) with assistance level (OT)  Flowsheets (Taken 01/01/2023 1231) LTG: Pt will perform toileting task (3/3 steps) with assistance level: Independent   Problem: RH Functional Use  of Upper Extremity Goal: LTG Patient will use RT/LT upper extremity as a (OT) Description: LTG: Patient will use right/left upper extremity as a stabilizer/gross assist/diminished/nondominant/dominant level with assist, with/without cues during functional activity (OT) Flowsheets (Taken 01/01/2023 1231) LTG: Use of upper extremity in functional activities: LUE as diminished level LTG: Pt will use upper extremity in functional activity with assistance level of: Independent   Problem: RH Light Housekeeping Goal: LTG Patient will perform light housekeeping w/assist (OT) Description: LTG: Patient will perform light housekeeping with assistance, with/without cues (OT). Flowsheets (Taken 01/01/2023 1231) LTG: Pt will perform light housekeeping with assistance level of: Supervision/Verbal cueing   Problem: RH Toilet Transfers Goal: LTG Patient will perform toilet transfers w/assist (OT) Description: LTG: Patient will perform toilet transfers with assist, with/without cues using equipment (OT) Flowsheets (Taken 01/01/2023 1231) LTG: Pt will perform toilet transfers with assistance level of: Independent   Problem: RH Tub/Shower Transfers Goal: LTG Patient will perform tub/shower transfers w/assist (OT) Description: LTG: Patient will perform tub/shower transfers with assist, with/without cues using equipment (OT) Flowsheets (Taken 01/01/2023 1231) LTG: Pt will perform tub/shower stall transfers with assistance level of: Independent LTG: Pt will perform tub/shower transfers from: Walk in shower
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
## 2023-01-01 NOTE — Progress Notes (Signed)
PROGRESS NOTE   Subjective/Complaints:  No issues overnite , discussed cocaine use as major risk factor, pt denied IVDA.  On IV PCN through 3/15- pt aware of need for multiple f/u appts post d/c Still with numbness /weakness LUE   ROS- neg CP, SOB, N/V/D  Objective:   ECHO TEE  Result Date: 12/30/2022    TRANSESOPHOGEAL ECHO REPORT   Patient Name:   Jeremy Fritz Date of Exam: 12/30/2022 Medical Rec #:  [id number]       Height:       72.0 in Accession #:    [id number]      Weight:       165.6 lb Date of Birth:  [birth year]      BSA:          1.966 m Patient Age:    42 years        BP:           132/106 mmHg Patient Gender: M               HR:           105 bpm. Exam Location:  Inpatient Procedure: Transesophageal Echo, Color Doppler, Cardiac Doppler and Saline            Contrast Bubble Study Indications:     Stroke i63.9  History:         Patient has prior history of Echocardiogram examinations, most                  recent 12/26/2022. Risk Factors:Hypertension and Polysubstance                  Abuse.  Sonographer:     Raquel Sarna Senior RDCS Referring Phys:  Cecilie Kicks, R Diagnosing Phys: Kirk Ruths MD PROCEDURE: After discussion of the risks and benefits of a TEE, an informed consent was obtained from the patient. The transesophogeal probe was passed without difficulty through the esophogus of the patient. Sedation performed by different physician. The patient was monitored while under deep sedation. Anesthestetic sedation was provided intravenously by Anesthesiology: '233mg'$  of Propofol, '100mg'$  of Lidocaine. The patient developed no complications during the procedure.  IMPRESSIONS  1. No source of embolus identified.  2. Left ventricular ejection fraction, by estimation, is 20 to 25%. The left ventricle has severely decreased function. The left ventricle demonstrates global hypokinesis. The left ventricular internal cavity size was moderately  dilated.  3. Right ventricular systolic function is moderately reduced. The right ventricular size is normal.  4. Left atrial size was mildly dilated. No left atrial/left atrial appendage thrombus was detected.  5. Right atrial size was mildly dilated.  6. The mitral valve is normal in structure. Moderate mitral valve regurgitation.  7. The aortic valve is tricuspid. Aortic valve regurgitation is not visualized.  8. Agitated saline contrast bubble study was negative, with no evidence of any interatrial shunt. FINDINGS  Left Ventricle: Left ventricular ejection fraction, by estimation, is 20 to 25%. The left ventricle has severely decreased function. The left ventricle demonstrates global hypokinesis. The left ventricular internal cavity size was moderately dilated. Right Ventricle: The right ventricular size is normal. Right  ventricular systolic function is moderately reduced. Left Atrium: Left atrial size was mildly dilated. No left atrial/left atrial appendage thrombus was detected. Right Atrium: Right atrial size was mildly dilated. Pericardium: There is no evidence of pericardial effusion. Mitral Valve: The mitral valve is normal in structure. Moderate mitral valve regurgitation. Tricuspid Valve: The tricuspid valve is normal in structure. Tricuspid valve regurgitation is trivial. Aortic Valve: The aortic valve is tricuspid. Aortic valve regurgitation is not visualized. Aortic valve mean gradient measures 2.0 mmHg. Aortic valve peak gradient measures 3.8 mmHg. Pulmonic Valve: The pulmonic valve was normal in structure. Pulmonic valve regurgitation is trivial. Aorta: The aortic root is normal in size and structure. There is minimal (Grade I) plaque involving the descending aorta. IAS/Shunts: No atrial level shunt detected by color flow Doppler. Agitated saline contrast was given intravenously to evaluate for intracardiac shunting. Agitated saline contrast bubble study was negative, with no evidence of any  interatrial shunt. Additional Comments: No source of embolus identified. Spectral Doppler performed. AORTIC VALVE AV Vmax:      97.30 cm/s AV Vmean:     64.200 cm/s AV VTI:       0.155 m AV Peak Grad: 3.8 mmHg AV Mean Grad: 2.0 mmHg  AORTA Ao Root diam: 3.10 cm Ao Asc diam:  2.80 cm Kirk Ruths MD Electronically signed by Kirk Ruths MD Signature Date/Time: 12/30/2022/1:04:05 PM    Final    No results for input(s): "WBC", "HGB", "HCT", "PLT" in the last 72 hours. No results for input(s): "NA", "K", "CL", "CO2", "GLUCOSE", "BUN", "CREATININE", "CALCIUM" in the last 72 hours.  Intake/Output Summary (Last 24 hours) at 01/01/2023 0823 Last data filed at 01/01/2023 0718 Gross per 24 hour  Intake 351.21 ml  Output 1225 ml  Net -873.79 ml        Physical Exam: Vital Signs Blood pressure (!) 126/91, pulse 98, temperature 98.1 F (36.7 C), temperature source Oral, resp. rate 18, height 6' (1.829 m), weight 72.7 kg, SpO2 94 %.   General: No acute distress Mood and affect are appropriate Heart: Regular rate and rhythm no rubs murmurs or extra sounds Lungs: Clear to auscultation, breathing unlabored, no rales or wheezes Abdomen: Positive bowel sounds, soft nontender to palpation, nondistended Extremities: No clubbing, cyanosis, or edema Skin: No evidence of breakdown, no evidence of rash Neurologic: Cranial nerves II through XII intact, motor strength is 5/5 in RIght  deltoid, bicep, tricep, grip,Left and RIght  hip flexor, knee extensors, ankle dorsiflexor and plantar flexor Sensory exam normal sensation to light touch and proprioception in right  upper and lower extremities Left UE absent LT sensation   Musculoskeletal: Full range of motion in all 4 extremities. No joint swelling   Assessment/Plan: 1. Functional deficits which require 3+ hours per day of interdisciplinary therapy in a comprehensive inpatient rehab setting. Physiatrist is providing close team supervision and 24 hour  management of active medical problems listed below. Physiatrist and rehab team continue to assess barriers to discharge/monitor patient progress toward functional and medical goals  Care Tool:  Bathing              Bathing assist       Upper Body Dressing/Undressing Upper body dressing        Upper body assist      Lower Body Dressing/Undressing Lower body dressing            Lower body assist       Toileting Toileting    Toileting assist  Transfers Chair/bed transfer  Transfers assist           Locomotion Ambulation   Ambulation assist              Walk 10 feet activity   Assist           Walk 50 feet activity   Assist           Walk 150 feet activity   Assist           Walk 10 feet on uneven surface  activity   Assist           Wheelchair     Assist               Wheelchair 50 feet with 2 turns activity    Assist            Wheelchair 150 feet activity     Assist          Blood pressure (!) 126/91, pulse 98, temperature 98.1 F (36.7 C), temperature source Oral, resp. rate 18, height 6' (1.829 m), weight 72.7 kg, SpO2 94 %.  Medical Problem List and Plan: 1. 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 Cocaine + tobacco abuse, has cardiomyopathy but TEE neg for clot or veg             -patient may shower             -ELOS/Goals: 5-7 days S             Admit to CIR 2.  Antithrombotics: -DVT/anticoagulation:  Pharmaceutical: Lovenox             -antiplatelet therapy: DAPT X 3 months followed by ASA alone.  3. Pain Management: Tylenol prn.  --Will add Voltaren gel for chronic left knee pain due to ligamentous injury?.  4. Mood/Behavior/Sleep: LCSW to follow for evaluation and support.              -antipsychotic agents: N/A 5. Neuropsych/cognition: This patient is capable of making decisions on his own behalf. 6. Skin/Wound Care: Routine pressure relief  measures.  7. Fluids/Electrolytes/Nutrition: Monitor I/O. Check CMET in am.  8. NICM EF-25-30%: Strict I/O- 1800 cc FR w/heart healthy diet.  --Monitor for signs of overload. Check wts daily.              --continue Losartan, Coreg, Lipitor, Jardiance.   9. HTN: Monitor BP TID--poorly controlled. ON Norvasc, Losartan, increase Coreg to 12.'5mg'$  BID  Vitals:   12/31/22 2032 01/01/23 0415  BP:  (!) 126/91  Pulse:  98  Resp:  18  Temp: 98.2 F (36.8 C) 98.1 F (36.7 C)  SpO2:  94%    10. 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 Discussed expected duration of antibiotics and likely length of CIR admission. May need to touch base with ID regarding if he can be transitioned to oral antibiotic on discharge.  11. OSA: Has not had CPAP for 6 months. Does not have PCP. 12. H/o cocaine/THC abuse:  Encourage cessation/healthy lifestyle.  13. Tachycardia: magnesium reviewed and normal. Increase Coreg to 12.'5mg'$  BID    LOS: 1 days A FACE TO FACE EVALUATION WAS PERFORMED  Charlett Blake 01/01/2023, 8:23 AM

## 2023-01-01 NOTE — Progress Notes (Signed)
Inpatient Rehabilitation  Patient information reviewed and entered into eRehab system by Jenya Putz M. Shakiah Wester, M.A., CCC/SLP, PPS Coordinator.  Information including medical coding, functional ability and quality indicators will be reviewed and updated through discharge.    
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
## 2023-01-02 DIAGNOSIS — I63511 Cerebral infarction due to unspecified occlusion or stenosis of right middle cerebral artery: Secondary | ICD-10-CM | POA: Diagnosis not present

## 2023-01-02 DIAGNOSIS — F1910 Other psychoactive substance abuse, uncomplicated: Secondary | ICD-10-CM

## 2023-01-02 NOTE — Progress Notes (Signed)
Occupational Therapy Session Note  Patient Details  Name: Jeremy Fritz MRN: [id number] Date of Birth: 12/25/1980  Today's Date: 01/02/2023 OT Individual Time: 1100-1208 and 1415-1445  OT Individual Time Calculation (min): 68 min and 30 min    Short Term Goals: Week 1:  OT Short Term Goal 1 (Week 1): STGs = LTGs  Skilled Therapeutic Interventions/Progress Updates:    Visit 1: no c/o pain  Pt received sleeping in bed but awoke easily.  Pt agreeable to ambulating to gym. Pt connected to IV so pt walked to gym pushing pole himself with  R hand with CGA. Pt sat on mat and in sitting noticed today (versus eval in room) that he sits with a L bias.  Had pt work on dynamic reaching to R for R side elongation with L hip elevation and then reverse direction.  Pt continues to have L inattention but his eyes do track to L and he scans his environment.   Focus of this session on LUE NMR: Shoulder - B hands on dowel bar for sh lifts and chest presses, coordination with rowing.  Pt unable to maintain grasp so used coban to stabilize his hand on bar.   Elbow and hand - reaching arm out and grasping and releasing small cones. Pt did well with this task and did not have difficulty scanning to the left.  [place] with picking up small erasers with tip pinch and placing them in a cup.  Initially, pt lifting his arm in too much abduction to compensate for decreased wrist control.  Guided his arm so pt was using more forearm rotation as he placed small pieces into cup.  He has limited thumb AROM so worked on a/arom of thumb in various directions.    Pt ambulated back to room with CGA.  Pt needed to use restroom and was able to ambulate in and toilet with Supervision.  Stood at sink to wash hands but has he rinsed hands and began to dry them he still had soapy residue all over L hand.  Cued to rinse hand. Pt did not realize his hand was soapy.  Pt returned to EOB to sit for lunch.   All needs met.   Visit 2:  Pain: no  pain  Pt received in room and ready for therapy. He was still connected to IV so for this short session just worked in the room.  Brought resistive clothespins to room and pt practiced using lateral pinch to fasten and unfasten resistive pins. Pt able to do yellow but found the red and green pins more easily but his fingers were slipping on the green ones as they were the most resistive. Fastened coban around end of pins to give him grip. Pt able to manipulate these more easily.  Left one pin in room for him to practice with.  He worked on towel scrunches and advised him to continue to practice these over the weekend.  Pt resting EOB with all needs met.   Therapy Documentation Precautions:  Precautions Precautions: Fall Precaution Comments: L inattention Restrictions Weight Bearing Restrictions: No  Pain: Pain Assessment Pain Scale: 0-10 Pain Score: 0-No pain ADL: ADL Eating: Independent Grooming: Independent Upper Body Bathing: Supervision/safety Where Assessed-Upper Body Bathing: Shower Lower Body Bathing: Supervision/safety Where Assessed-Lower Body Bathing: Shower Upper Body Dressing: Independent Where Assessed-Upper Body Dressing: Edge of bed Lower Body Dressing: Supervision/safety Where Assessed-Lower Body Dressing: Edge of bed Toileting: Supervision/safety Where Assessed-Toileting: Glass blower/designer: Close supervision Toilet Transfer Method:  Ambulating Social research officer, government: Close supervision Social research officer, government Method: Heritage manager: Grab bars, Shower seat with back   Therapy/Group: Individual Therapy  Jontue Crumpacker 01/02/2023, 12:57 PM

## 2023-01-02 NOTE — Progress Notes (Signed)
PROGRESS NOTE   Subjective/Complaints:  Has neuropsych eval , discussed with Dr Sima Matas  ROS- neg CP, SOB, N/V/D  Objective:   No results found. Recent Labs    01/01/23 0756  WBC 8.9  HGB 13.2  HCT 38.0*  PLT 340   Recent Labs    01/01/23 0756  NA 136  K 3.7  CL 105  CO2 22  GLUCOSE 187*  BUN 13  CREATININE 0.99  CALCIUM 8.6*    Intake/Output Summary (Last 24 hours) at 01/02/2023 [id number] Last data filed at 01/02/2023 [id number] Gross per 24 hour  Intake 1155.49 ml  Output 450 ml  Net 705.49 ml         Physical Exam: Vital Signs Blood pressure (!) 127/97, pulse 98, temperature 98 F (36.7 C), temperature source Oral, resp. rate 18, height 6' (1.829 m), weight 75 kg, SpO2 99 %.   General: No acute distress Mood and affect are appropriate Heart: Regular rate and rhythm no rubs murmurs or extra sounds Lungs: Clear to auscultation, breathing unlabored, no rales or wheezes Abdomen: Positive bowel sounds, soft nontender to palpation, nondistended Extremities: No clubbing, cyanosis, or edema Skin: No evidence of breakdown, no evidence of rash Neurologic: Cranial nerves II through XII intact, motor strength is 5/5 in RIght  deltoid, bicep, tricep, grip,Left and RIght  hip flexor, knee extensors, ankle dorsiflexor and plantar flexor Sensory exam normal sensation to light touch and proprioception in right  upper and lower extremities Left UE absent LT sensation   Musculoskeletal: Full range of motion in all 4 extremities. No joint swelling   Assessment/Plan: 1. Functional deficits which require 3+ hours per day of interdisciplinary therapy in a comprehensive inpatient rehab setting. Physiatrist is providing close team supervision and 24 hour management of active medical problems listed below. Physiatrist and rehab team continue to assess barriers to discharge/monitor patient progress toward functional and medical  goals  Care Tool:  Bathing    Body parts bathed by patient: Right arm, Left arm, Chest, Abdomen, Front perineal area, Buttocks, Right upper leg, Left upper leg, Right lower leg, Left lower leg, Face         Bathing assist Assist Level: Supervision/Verbal cueing     Upper Body Dressing/Undressing Upper body dressing   What is the patient wearing?: Pull over shirt    Upper body assist Assist Level: Supervision/Verbal cueing    Lower Body Dressing/Undressing Lower body dressing      What is the patient wearing?: Underwear/pull up, Pants     Lower body assist Assist for lower body dressing: Supervision/Verbal cueing     Toileting Toileting    Toileting assist Assist for toileting: Supervision/Verbal cueing     Transfers Chair/bed transfer  Transfers assist     Chair/bed transfer assist level: Contact Guard/Touching assist     Locomotion Ambulation   Ambulation assist      Assist level: Contact Guard/Touching assist Assistive device: No Device Max distance: 175'   Walk 10 feet activity   Assist     Assist level: Contact Guard/Touching assist Assistive device: No Device   Walk 50 feet activity   Assist    Assist level: Contact Guard/Touching  assist Assistive device: No Device    Walk 150 feet activity   Assist    Assist level: Contact Guard/Touching assist Assistive device: No Device    Walk 10 feet on uneven surface  activity   Assist     Assist level: Contact Guard/Touching assist     Wheelchair     Assist Is the patient using a wheelchair?: No Type of Wheelchair: Manual    Wheelchair assist level: Dependent - Patient 0% Max wheelchair distance: 150'    Wheelchair 50 feet with 2 turns activity    Assist        Assist Level: Dependent - Patient 0%   Wheelchair 150 feet activity     Assist      Assist Level: Dependent - Patient 0%   Blood pressure (!) 127/97, pulse 98, temperature 98 F (36.7  C), temperature source Oral, resp. rate 18, height 6' (1.829 m), weight 75 kg, SpO2 99 %.  Medical Problem List and Plan: 1. 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 Cocaine + tobacco abuse, has cardiomyopathy but TEE neg for clot or veg             -patient may shower             -ELOS/Goals: 5-7 days S             Admit to CIR 2.  Antithrombotics: -DVT/anticoagulation:  Pharmaceutical: Lovenox             -antiplatelet therapy: DAPT X 3 months followed by ASA alone.  3. Pain Management: Tylenol prn.  --Will add Voltaren gel for chronic left knee pain due to ligamentous injury?.  4. Mood/Behavior/Sleep: LCSW to follow for evaluation and support.              -antipsychotic agents: N/A 5. Neuropsych/cognition: This patient is capable of making decisions on his own behalf. 6. Skin/Wound Care: Routine pressure relief measures.  7. Fluids/Electrolytes/Nutrition: Monitor I/O. Check CMET in am.  8. NICM EF-25-30%: Strict I/O- 1800 cc FR w/heart healthy diet.  --Monitor for signs of overload. Check wts daily.              --continue Losartan, Coreg, Lipitor, Jardiance.   9. HTN: Monitor BP TID--poorly controlled. ON Norvasc, Losartan, increase Coreg to 12.'5mg'$  BID  Vitals:   01/01/23 1926 01/02/23 0418  BP: 114/81 (!) 127/97  Pulse: 95 98  Resp: 16 18  Temp: 98.7 F (37.1 C) 98 F (36.7 C)  SpO2: 99% 99%    10. 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 Discussed expected duration of antibiotics and likely length of CIR admission. May need to touch base with ID regarding if he can be transitioned to oral antibiotic on discharge.  11. OSA: Has not had CPAP for 6 months. Does not have PCP. 12. H/o cocaine/THC abuse:  Encourage cessation/healthy lifestyle. Neuropsych to counsel 13. Tachycardia: magnesium reviewed and normal. Increase Coreg to 12.'5mg'$  BID    LOS: 2 days A FACE TO FACE EVALUATION WAS PERFORMED  Charlett Blake 01/02/2023,  8:22 AM

## 2023-01-02 NOTE — Consult Note (Signed)
Neuropsychological Consultation Comprehensive Inpatient Rehab   Patient:   Jeremy Fritz   DOB:   09/07/1981  MR Number:  [id number]  Location:  [facility] [id number] [place] [address] Dept: [place]: [phone]           Date of Service:   01/02/2023  Start Time:   8 AM End Time:   9 AM  Provider/Observer:  Ilean Skill, Psy.D.       Clinical Neuropsychologist       Billing Code/Service: [phone]  Reason for Service:    Jeremy Fritz is a 42 year old male referred for neuropsychological consultation as part of his overall care during his admission to the comprehensive inpatient rehabilitation unit.  The purpose of this was due to a history of polysubstance abuse including THC and cocaine in the setting of recent cerebrovascular event.  Patient's other past medical history includes obstructive sleep apnea, hypertension.  Patient was admitted on 12/25/2022 after a weeklong history of left upper extremity greater than left lower extremity weakness and ultimately agreed to the encouragement by family to seek medical assistance.  Patient's urine drug screen was positive for both cocaine and THC with patient admitting to recent cocaine use but not daily use.  Patient denied any intravenous drug use history.  Medical evaluation identified acute or subacute right MCA infarct with leftward midline shift and suspicion of thrombus M2 MCA branch.  CT head/neck showed acute right M3 occlusion, small carotid web cervical right ICA and mild pulmonary congestion/edema.  MRI brain done revealed moderate to large infarct right MCA with edema and no midline shift.  Neurology felt stroke was likely due to cocaine use but also question possibility of neurosyphilis.  Patient reports that he was likely treated for syphilis during an extended incarceration but does not know exactly what he was given  antibiotics for the time.  Once therapy evaluations were completed the patient was referred to the inpatient comprehensive rehabilitation service due to left inattention with left-sided weakness, poor awareness of deficits and impulsivity.  Functional decline was noted.  During the clinical visit today, the patient was oriented and in relatively good spirits.  Patient was able to describe in general terms his recent medical event and acknowledged his cocaine and marijuana use recently but denied regular or heavy cocaine use.  Patient reports that he was incarcerated for 7 years until recently and did not engage in substance abuse while incarcerated.  Patient did have an emergency department visit in December 2023 with an apparent accidental drug overdose.  Patient admitted to marijuana use but thinks that the [facility] was laced with opiate substance.  Patient was reported to have become unresponsive after smoking the substance with agonal breathing.  EMS arrived and administered Narcan with immediate improvement to his mental status and respiratory status.  Patient also had a behavioral health admission and crisis management/stabilization after experiencing a blackout while doing an "assortment of drugs."  Patient reported to having chest pain and felt like he was going to die and called police for help.  Patient was cooperative during acute care with psychosocial events kept him from going to rehab after discharge.  Patient also had a earlier admission in 2013 that occurred prior to his incarceration.  This admission included positive urine drug screen for cocaine and THC with the patient experiencing psychosis with auditory hallucinations and suicidal ideation.  Patient reports that the only 2  sexual partners he has had since his incarceration and did have had STI testing and were negative for syphilis.  Patient reports that it has been stressful to be in the hospital for an extended period of time but is motivated  to make functional improvements.  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  Patient knows that his current deficits would not allow him to do so without significant improvement.  Patient denies depression or anxiety symptoms to the point that they would keep him from actively participating in therapeutic efforts.  Patient reports that he remains motivated but admits to looking forward to discharge with an expected discharge date in 2 days.  Patient plans to follow-up with outpatient therapies as needed.  HPI for the current admission:    HPI:  Jeremy Fritz is a 42 year old male with history of polysubstance abuse, OSA, HTN who was admitted on 12/25/22 with one week history of LUE>LLE  weakness and was encouraged by family to seek medical assistance. UDS positive for cocaine and THC. He was found to have acute or subacute R-MCA infarct with left ward midline shift and suspicion of thrombus M2 MCA branch. CTA head/neck showed acute proximal stump R M3 occlusion, small carotid web cervical R-ICA and mild pulmonary congestion/edema. MRI brain done revealing moderate to large infarct R-MCA with edema and sulcal effacement but no midline shift.  He was also  noted to have positive RPR with elevated titer and Dr. Leonie Man felt that stroke likely due to cocaine use and question neurosyphilis  neurology recommends DAPT X 3 months followed by ASA alone given intracranial occlusion.    2 D echo showed EF 25-30% with  LV global hypokinesis and moderate to severe TVR. Cardiology consulted and recommended GDMT w/ coreg, Entresto as well as question of coronary CT/stress MRI. He was started on IV PCN G and LP recommended but patient reportedly decline. TEE 03/05 was negative for thrombus or shunt w/ negative bubble study, showed  global hypokinesis w/EF 20-25% and  moderate to severe MVR. Coreg being titrated upwards for diastolic HTN and plans to transition to [facility]. No need for diuretics as euvolemic. Dr. Juleen China following for input felt that high RPR titer suspicious for early neurosyphilis and recommends LP w/CSF VDRL and cell count with routine cultures (patient now willing) but neurology feels not needed.  Plans for treatment with 14 day PCN G 24,000 thru 03/15 followed by dose of Bicillin 03/16. Patient with left inattention with left sided weakness, has poor awareness of deficits and impulsivity. CIR recommended due to functional decline. His mother asks whether he will need to go home with IV antibiotics.  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 his dress was Appropriate and he was Well Groomed and his manners were Appropriate to the situation.  his participation was indicative of Appropriate and Redirectable behaviors.  There were physical disabilities noted.  he displayed an appropriate level of cooperation and motivation.    Interactions:    Active Appropriate  Attention:   abnormal and attention span appeared shorter than expected for age  Memory:   within normal limits; recent and remote memory intact  Visuo-spatial:   abnormal  Speech (Volume):  normal  Speech:   normal; normal  Thought Process:  Coherent and Relevant   Concrete, Directed, and Logical  Though Content:  WNL; not suicidal and not homicidal  Orientation:   person, place, time/date, and situation  Judgment:   Fair  Planning:   Poor  Affect:    Depressed  Mood:    Dysphoric  Insight:   Fair  Intelligence:   normal  Psychiatric History:  Patient with significant past psychiatric history related to polysubstance abuse primarily THC and cocaine products.  Patient had an episode with psychosis/auditory hallucination and suicidal ideation and fears of dying at the same time prior to an extended incarceration.  Patient has had several emergency department presentations since being released related to an accidental overdose on apparently laced THC product that likely contained opiate.  Patient denies a desire or history for opiate use.  Patient denies IV drug use.  Previous Diagnoses:  Patient with previous diagnoses that include polysubstance abuse with history of acute psychosis under polysubstance use and history of suicidal ideation.  Past Psychiatric Treatments: Psychiatric disorder and Suicidal ideations  History of Substance Use or Abuse:  There is a documented history of cocaine and marijuana abuse confirmed by the patient.  While patient does have 1 hospital admission with possible opiate overdose the patient appears to be quite forthright in admission to drug use and even the emergency department note identifies the likely opiate use was related to a THC product being laced.  Patient did respond to Narcan.  Mental Health Hospitalizations:  Yes   Family Med/Psych History:  Family History  Problem Relation Age of Onset   Hyperlipidemia Mother    Hypertension Mother    Heart failure Father     Risk of Suicide/Violence: low patient denies any current suicidal or homicidal ideation and the suicidal ideation was noted in 2013.  Impression/DX:   Jeremy Fritz is a 42 year old male referred for neuropsychological consultation as part  of his overall care during his admission to the comprehensive inpatient rehabilitation unit.  The purpose of this was due to a history of polysubstance abuse including THC and cocaine in the setting of recent cerebrovascular event.  Patient's other past medical history includes obstructive sleep apnea, hypertension.  Patient was admitted on 12/25/2022 after a weeklong history of left upper extremity greater than left lower extremity weakness and ultimately agreed to the encouragement by family to seek medical assistance.  Patient's urine drug screen was positive for both cocaine and THC with patient admitting to recent cocaine use but not daily use.  Patient denied any intravenous drug use history.  Medical evaluation identified acute or subacute right MCA infarct with leftward midline shift and suspicion of thrombus M2 MCA branch.  CT head/neck showed acute right M3 occlusion, small carotid web cervical right ICA and mild pulmonary congestion/edema.  MRI brain done revealed moderate to large infarct right MCA with edema and  no midline shift.  Neurology felt stroke was likely due to cocaine use but also question possibility of neurosyphilis.  Patient reports that he was likely treated for syphilis during an extended incarceration but does not know exactly what he was given antibiotics for the time.  Once therapy evaluations were completed the patient was referred to the inpatient comprehensive rehabilitation service due to left inattention with left-sided weakness, poor awareness of deficits and impulsivity.  Functional decline was noted.  During the clinical visit today, the patient was oriented and in relatively good spirits.  Patient was able to describe in general terms his recent medical event and acknowledged his cocaine and marijuana use recently but denied regular or heavy cocaine use.  Patient reports that he was incarcerated for 7 years until recently and did not engage in substance abuse while  incarcerated.  Patient did have an emergency department visit in December 2023 with an apparent accidental drug overdose.  Patient admitted to marijuana use but thinks that the [facility] was laced with opiate substance.  Patient was reported to have become unresponsive after smoking the substance with agonal breathing.  EMS arrived and administered Narcan with immediate improvement to his mental status and respiratory status.  Patient also had a behavioral health admission and crisis management/stabilization after experiencing a blackout while doing an "assortment of drugs."  Patient reported to having chest pain and felt like he was going to die and called police for help.  Patient was cooperative during acute care with psychosocial events kept him from going to rehab after discharge.  Patient also had a earlier admission in 2013 that occurred prior to his incarceration.  This admission included positive urine drug screen for cocaine and THC with the patient experiencing psychosis with auditory hallucinations and suicidal ideation.  Patient reports that the only 2 sexual partners he has had since his incarceration and did have had STI testing and were negative for syphilis.  Patient reports that it has been stressful to be in the hospital for an extended period of time but is motivated to make functional improvements.  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  Patient knows that his current deficits would not allow him to do so without significant improvement.  Patient denies depression or anxiety symptoms to the point that they would keep him from actively participating in therapeutic efforts.  Patient reports that he remains motivated but admits to looking forward to discharge with an expected discharge date in 2 days.  Patient plans to follow-up with outpatient  therapies as needed.  Disposition/Plan:  The patient will be discharged soon and today an extended period of time was spent talking about his history of THC and cocaine use/abuse and the likely relationship to cocaine use and his recent cerebrovascular event.  Patient reports that he has not been using substances like he did in the past but admits to using them recently primarily with women that he has an ongoing relationship with and they were "partying."  Diagnosis:    Polysubstance abuse and recent CVA.         Electronically Signed   _______________________ Ilean Skill, Psy.D. Clinical Neuropsychologist

## 2023-01-02 NOTE — Progress Notes (Signed)
Physical Therapy Session Note  Patient Details  Name: Jeremy Fritz MRN: [id number] Date of Birth: 14-Apr-1981  Today's Date: 01/02/2023 PT Individual Time: 1510-1620 PT Individual Time Calculation (min): 70 min   Short Term Goals: Week 1:  PT Short Term Goal 1 (Week 1): STGs = LTGs  Skilled Therapeutic Interventions/Progress Updates:  Patient seated on EOB on entrance to room. Patient alert and agreeable to PT session.   Patient with no pain complaint at start of session.  Therapeutic Activity: Transfers: Pt performed sit<>stand and stand pivot transfers throughout session with distant supervision. No cueing provided for technique.  Gait Training:  Pt ambulated 185' x2 using no AD with close supervision/ CGA. Demonstrated decreased control of LLE advancement with decreased time on LLE. Provided vc/ tc for focus on LLE during gait and attempt to increase motor control.  Neuromuscular Re-ed: NMR facilitated during session with focus on dynamic standing balance. Pt guided in dynamic step out to colored discs placed far outside of BOS. Pt allowed to self choose LE and UE to use to reach disc, then return to start position. Requires controlled lunge to reach. Pt self choosing to utilize LUE and progressing to use LLE for lunge. Progressed pt to call out of color, then UE to use, then LE. No LOB noted throughout.  Pt then guided in toss of beanbags to target placed 15 feet away from pt. Reaches to acquire beanbags to L using LUE and pass to RUE for throw. Utilizes underhand toss with LLE lunge. All bags on board or in target.  NMR performed for improvements in motor control and coordination, balance, sequencing, judgement, and self confidence/ efficacy in performing all aspects of mobility at highest level of independence.   Education provided at end of session for expectation for increased fatigue and need for more rest. Also, expectation for slower return of UE over LE as well as slow return  of sensation.   Patient sidesitting across bed at end of session with brakes locked, no alarm set as mother in room providing supervision, and all needs within reach.  Therapy Documentation Precautions:  Precautions Precautions: Fall Precaution Comments: L inattention Restrictions Weight Bearing Restrictions: No General:   Vital Signs: Therapy Vitals Temp: 98.7 F (37.1 C) Temp Source: Oral Pulse Rate: 90 Resp: 16 BP: 130/87 Patient Position (if appropriate): Sitting Oxygen Therapy SpO2: 100 % O2 Device: Room Air Pain:  No pain complaint this session.   Therapy/Group: Individual Therapy  Alger Simons PT, DPT, CSRS 01/02/2023, 5:56 PM

## 2023-01-02 NOTE — IPOC Note (Signed)
Overall Plan of Care [facility]) Patient Details Name: Jeremy Fritz MRN: [id number] DOB: 14-Mar-1981  Admitting Diagnosis: Acute ischemic right MCA stroke [facility])  Hospital Problems: Principal Problem:   Acute ischemic right MCA stroke [facility])     Functional Problem List: Nursing Perception, Bowel, Safety, Sensory, Endurance, Medication Management, Motor  PT Balance, Motor, Safety, Sensory  OT Balance, Motor, Perception  SLP  (N/A)  TR         Basic ADL's: OT Bathing, Dressing, Toileting     Advanced  ADL's: OT Light Housekeeping     Transfers: PT Bed Mobility, Bed to Chair, Car, Manufacturing systems engineer, Metallurgist: PT Ambulation, Stairs     Additional Impairments: OT Fuctional Use of Upper Extremity  SLP  (N/A)      TR      Anticipated Outcomes Item Anticipated Outcome  Self Feeding independent  Swallowing  N/A   Basic self-care  independent  Toileting  independent   Bathroom Transfers independent  Bowel/Bladder  continent B/B  Transfers  Independent  Locomotion  Independent  Communication  N/A  Cognition  N/A  Pain  less than 2  Safety/Judgment  remain free from falls while in rehab   Therapy Plan: PT Intensity: Minimum of 1-2 x/day ,45 to 90 minutes PT Frequency: 5 out of 7 days PT Duration Estimated Length of Stay: 5-7 Days OT Intensity: Minimum of 1-2 x/day, 45 to 90 minutes OT Frequency: 5 out of 7 days OT Duration/Estimated Length of Stay: 5-7 days SLP Intensity:  (N/A) SLP Frequency:  (N/A) SLP Duration/Estimated Length of Stay: N/A   Team Interventions: Nursing Interventions Patient/Family Education, Pain Management, Medication Management, Discharge Planning, Bowel Management, Psychosocial Support, Disease Management/Prevention  PT interventions Ambulation/gait training, Community reintegration, DME/adaptive equipment instruction, Neuromuscular re-education, Psychosocial support, Stair training, UE/LE Strength taining/ROM,  Training and development officer, Discharge planning, UE/LE Coordination activities, Therapeutic Activities, Skin care/wound management, Cognitive remediation/compensation, Patient/family education, Therapeutic Exercise, Visual/perceptual remediation/compensation, Splinting/orthotics, Functional mobility training, Disease management/prevention, Pain management, Functional electrical stimulation  OT Interventions Balance/vestibular training, Discharge planning, Functional mobility training, Neuromuscular re-education, Patient/family education, Psychosocial support, Self Care/advanced ADL retraining, UE/LE Strength taining/ROM, UE/LE Coordination activities, Therapeutic Activities, Therapeutic Exercise  SLP Interventions  (N/A)  TR Interventions    SW/CM Interventions Discharge Planning, Psychosocial Support, Patient/Family Education   Barriers to Discharge MD  Medical stability and Medication compliance  Nursing Decreased caregiver support, Home environment access/layout, IV antibiotics, Wound Care, Lack of/limited family support, Medication compliance 1 level with 2 ste rail on R/L  PT      OT      SLP  (N/A)    SW Decreased caregiver support, Lack of/limited family support, Insurance underwriter for SNF coverage     Team Discharge Planning: Destination: PT-Home ,OT- Home , SLP-Home Projected Follow-up: PT-Outpatient PT, OT-  Outpatient OT, SLP-Other (comment) (intermittent supervision and assistance) Projected Equipment Needs: PT-To be determined, OT- None recommended by OT, SLP-  Equipment Details: PT- , OT-  Patient/family involved in discharge planning: PT- Patient, Family member/caregiver,  OT-Patient, SLP-Patient  MD ELOS: 7-10d Medical Rehab Prognosis:  Good Assessment: The patient has been admitted for CIR therapies with the diagnosis of RIght MCA infarct. The team will be addressing functional mobility, strength, stamina, balance, safety, adaptive techniques and equipment, self-care, bowel and  bladder mgt, patient and caregiver education, lifestyle issues to minimize stroke recurrence. Goals have been set at Mod I. Anticipated discharge destination is Home.  See Team Conference Notes for weekly updates to the plan of care

## 2023-01-02 NOTE — Progress Notes (Signed)
Physical Therapy Session Note  Patient Details  Name: OSCAR CARPENITO MRN: [id number] Date of Birth: 1981-10-22  Today's Date: 01/02/2023 PT Individual Time: 0905-1008 PT Individual Time Calculation (min): 63 min   Short Term Goals: Week 1:  PT Short Term Goal 1 (Week 1): STGs = LTGs  Skilled Therapeutic Interventions/Progress Updates:    Pt recived exiting the bathroom w/ nurse and agreeable to PT services. Pt ambulated to the sink and performed hand hydgine standing w/ supervision A. Pt hooked up to IV pole and remained w/ pt for the entirety of the session Pt sat at EOB and donned shoes Independently.    Pt performed ~133f x2  of gait to and from gym with CGA w/o AD. Therapist assisting in IV pole management.  Pt demonstrates a lack of knee flexion during stance impaired L>R. Swing lacks motor control, however, no LOB. Pt expressed that he suffered a hyperextension injury to the L knee years ago, and let it untreated.   Pt engaged in exercises to work on motor control, coordination, and balance. Performed alternating foot taps on to 4"step x3 minutes w/ CGA. Pt demonstrated L postural sway when wb through L foot. When brought to pt's attention, pt was able to self-correct. Dynamic reaching balance task performed standing on airex pad w/ CGA. Pt instructed to reach for colored cone on the L then place on table to the R x5. Then instructed to correclty match each cone to the correct colored dot on the R x5. Noted reduced wb through L knee when reaching. Pt was careful when grasping cones, dropped 2 cones and was able to squat and grab cones from the floor w/ CGA.   UE NMR performed balancing tidal tank in sitting x1 minute. Progressed to balancing tidal tank and stepping task w/ CGA. Pt instructed to extend elbows and hold tidal tank while performing toe taps arranged in a semi-circular. Pt demonstrated decreased motor control more w/ LLE as stance leg.   Pt ambulated back to room w/ CGA and  left sitting at EOB w/ bed alarm on, call bell in reach and all needs met.   Therapy Documentation Precautions:  Precautions Precautions: Fall Precaution Comments: L inattention Restrictions Weight Bearing Restrictions: No General:    Pain: pt denies any pain         Therapy/Group: Individual Therapy  Reese Senk 01/02/2023, 10:23 AM

## 2023-01-02 NOTE — Care Management (Signed)
Inpatient [facility] Individual Statement of Services  Patient Name:  Jeremy Fritz  Date:  01/02/2023  Welcome to the [facility].  Our goal is to provide you with an individualized program based on your diagnosis and situation, designed to meet your specific needs.  With this comprehensive rehabilitation program, you will be expected to participate in at least 3 hours of rehabilitation therapies Monday-Friday, with modified therapy programming on the weekends.  Your rehabilitation program will include the following services:  Physical Therapy (PT), Occupational Therapy (OT), Speech Therapy (ST), 24 hour per day rehabilitation nursing, Therapeutic Recreaction (TR), Psychology, Neuropsychology, Care Coordinator, Rehabilitation Medicine, Dora, and Other  Weekly team conferences will be held on Wednesdays to discuss your progress.  Your Inpatient Rehabilitation Care Coordinator will talk with you frequently to get your input and to update you on team discussions.  Team conferences with you and your family in attendance may also be held.  Expected length of stay: 5-7 days    Overall anticipated outcome: Independent  Depending on your progress and recovery, your program may change. Your Inpatient Rehabilitation Care Coordinator will coordinate services and will keep you informed of any changes. Your Inpatient Rehabilitation Care Coordinator's name and contact numbers are listed  below.  The following services may also be recommended but are not provided by the [facility] will be made to provide these services after discharge if needed.  Arrangements include referral to agencies that provide these services.  Your insurance has been verified to be:  [place]  Your primary doctor is:   No PCP listed  Pertinent information will be shared with your doctor and your insurance company.  Inpatient Rehabilitation Care Coordinator:  Erlene Quan, [place] or [phone]  Information discussed with and copy given to patient by: Rana Snare, 01/02/2023, 11:16 AM

## 2023-01-03 DIAGNOSIS — I10 Essential (primary) hypertension: Secondary | ICD-10-CM

## 2023-01-03 DIAGNOSIS — I428 Other cardiomyopathies: Secondary | ICD-10-CM | POA: Diagnosis not present

## 2023-01-03 DIAGNOSIS — I63511 Cerebral infarction due to unspecified occlusion or stenosis of right middle cerebral artery: Secondary | ICD-10-CM | POA: Diagnosis not present

## 2023-01-03 NOTE — Progress Notes (Signed)
Occupational Therapy Session Note  Patient Details  Name: Jeremy Fritz MRN: [id number] Date of Birth: 03/03/1981  Today's Date: 01/03/2023 OT Individual Time: [id number] OT Individual Time Calculation (min): 74 min    Short Term Goals: Week 1:  OT Short Term Goal 1 (Week 1): STGs = LTGs  Skilled Therapeutic Interventions/Progress Updates:  Pt greeted supine in bed, pt agreeable to OT intervention. Session focus on BADL reeducation, functional mobility, dynamic standing balance, LUE FMC and decreasing overall caregiver burden.             Pt completed supine>sit with supervision from flat HOB. Pt completed functional ambulation from EOB >bathroom while holding IV pole with supervision, supervision for 3/3 toileting tasks with continent urine void.   Pt stood at sink for hand hygiene and oral care with supervision, pt using RUE mostly for oral care but did incorporate LUE as gross assist as needed. Pt completed functional ambulation holding IV pole to day room with supervision.  Remainder of session focused on LUE seated [place] tasks as indicated below: - pt instructed to use weighted hand exercises to grasp 1 inch pegs from peg board with an emphasis on grip strength and and [place], pt with decreased sensation needing cues for optimal hand positioning on device.  -pt completed folding paper task with an emphasis on bimanual coordination with pt instructed to fold paper and then use LUE to rip paper into small sections+ crumple paper into ball with LUE with an emphasis on composite digit flexion/extension, pt completed task with supervision with + time.  - pt then utilized level 3 weighted clothespins to grasp small balls of paper to transport into cup, pt also needed increased time to complete this task but able to complete task with overall supervision. -pt completed small puzzle task with puzzle pieces ~ 1/2 an inch, pt with great difficulty with this task likely d/t decreased sensation despite  max cues on set up of task and emphasis on bimanual coordination for motor relearning.  - pt able to work on pincer grasp by picking up small puzzle pieces and transporting into cup with LUE, pt with good pincer grasp developing on LUE - graded task up and had pt use level 3 clothespin to pick up small puzzle pieces and transport into cup, pt completed this task with supervision   Pt completed functional ambulation greater than a household distance back towards room while holding onto IV pole with only supervision assist.   Ended session with pt seated EOB, and all needs within reach, bed alarm activated.   Therapy Documentation Precautions:  Precautions Precautions: Fall Precaution Comments: L inattention Restrictions Weight Bearing Restrictions: No  Pain: no pain     Therapy/Group: Individual Therapy  Corinne Ports [facility] 01/03/2023, 10:38 AM

## 2023-01-03 NOTE — Progress Notes (Signed)
Physical Therapy Session Note  Patient Details  Name: Jeremy Fritz MRN: [id number] Date of Birth: 03-01-1981  Today's Date: 01/03/2023 PT Individual Time: 1005-1107 and 1540-1640 PT Individual Time Calculation (min): 62 min and 60 min  Short Term Goals: Week 1:  PT Short Term Goal 1 (Week 1): STGs = LTGs  Skilled Therapeutic Interventions/Progress Updates:    Session 1: Pt received long sitting in bed awake and agreeable to therapy session. Pt unable to be disconnected from IV for therapy. Transitioned to sitting EOB mod-I.  Sit<>stands, no AD, with close supervision/stand by assist for safety but no LOB throughout session. Gait in/out bathroom, no AD, with CGA for steadying (therapist managing IV pole). Standing with CGA/close supervision pt able to manage LB clothing without assist and continent of bladder. Standing hand hygiene at sink with close supervision and verbal cuing for increased incorporation of L UE into this task and others including opening/closing doors, turning on/off water, etc.  Gait training ~127f to main therapy gym, no AD, with CGA for steadying - slight lateral instability noticed but no LOB - reciprocal stepping pattern with good L LE foot clearance and step length.   Dynamic gait training and L UE NMR task of picking up and stacking narrow cones on one table to another while stepping over 2 triangle wedges with CGA for steadying - pt ambulating in slow, controlled manner with good L LE foot clearance over obstacle - no LOB.   Dynamic stepping and L UE fine motor NMR task of side stepping over a 4" hurdle while wearing 5lb ankle weight on L LE to pick up mini-PEG board piece, carry it to 2nd table, and then place it on PEG board to create pattern - had to wear a glove to improve grip on pieces - pt dropped ~3 pieces while stepping over hurdle due to poor sustained grasp - pt requires increased time to place PEGs in hole due to poor fine motor control.  Transitioned  to working on m[employer] Storeswhile sitting - when removed the dual-task of standing, pt able to place 5 PEGs in <272mutes which was significantly faster than while standing.   Gait training back to his room, no AD, with CGA for steadying as described above.  Pt left seated on EOB with needs in reach, lines intact, and bed alarm on.    Session 2: Pt received sitting on EOB reporting he is having some upper back soreness (thoracic region) and states he was thinking about declining this session, but is now agreeable. Pt reports he had an old friend visit him and this has led to him thinking about how to change his lifestyle for the better. Therapist provided therapeutic listening, emotional support, and encouragement of pt's positive life change desires. Pt hypervocal throughout session and reporting some fatigue this afternoon.  Sit<>stand, no AD, with CGA/close supervision throughout session. Therapist continues to manage IV pole throughout session to allow pt to ambulate without UE support.  Gait training ~25021fno AD, with CGA and 2x heavy min assist for balance due to L LOB - reciprocal stepping pattern, pt continuing to slightly kick L LE forward uncontrolled during swing (pt reports some of this may be baseline due to prior knee injury) - pt overall has increased lateral instability this afternoon with increased postural sway and path deviation (min weaving back/forth)  Pt retrieved 5lb tidal tank from shelf and performed additional gait training ~70f71file holding tidal tank out in front of him with  elbows extended and shoulders flexed - pt now with sufficient grip strength in L UE to hold the tank until becoming fatigued - continued CGA for steadying.  Seated B UE exercises focusing on L UE NMR: - bicep curls using 5lb tidal tank 2x15reps - 4lb L UE bicep curl  - 4lb overhead press x15 reps L UE only, then 4lb with B UEs x9 reps with pt fatigued in LUE - 4lb wrist extension  2x7-9reps Therapist providing verbal/visual/tactile cuing for proper form/technique  Seated L UE NMR via basketball shooting and pt grasping the ball with 1 hand working on sufficient finger extension to open hand fully and then palm the ball.   Gait training back to his room as described above. Functional gait training in room with L UE NMR to collect his dirty clothes, open/close cabinet and drawers, and collect clothes into a bag. Gait training in/out bathroom with CGA and performed LB clothing management and continent of bladder with distant supervision.   Pt left seated on EOB with needs in reach, bed alarm on, and lines intact.    Therapy Documentation Precautions:  Precautions Precautions: Fall Precaution Comments: L inattention Restrictions Weight Bearing Restrictions: No   Pain:  Session 1: No reports of pain throughout session.  Session 2: Thoracic back soreness - reports it looses out some during session - modified interventions as needed but this does not limit participation.    Therapy/Group: Individual Therapy  Tawana Scale , PT, DPT, NCS, CSRS 01/03/2023, 7:58 AM

## 2023-01-03 NOTE — Progress Notes (Signed)
PROGRESS NOTE   Subjective/Complaints:  Resting in bed. Squeezing clamps with left hand to work on grip strength. Denies any pain.    ROS: Patient denies fever, rash, sore throat, blurred vision, dizziness, nausea, vomiting, diarrhea, cough, shortness of breath or chest pain, joint or back/neck pain, headache, or mood change.   Objective:   No results found. Recent Labs    01/01/23 0756  WBC 8.9  HGB 13.2  HCT 38.0*  PLT 340   Recent Labs    01/01/23 0756  NA 136  K 3.7  CL 105  CO2 22  GLUCOSE 187*  BUN 13  CREATININE 0.99  CALCIUM 8.6*    Intake/Output Summary (Last 24 hours) at 01/03/2023 1034 Last data filed at 01/03/2023 0644 Gross per 24 hour  Intake 1183.12 ml  Output 1950 ml  Net -766.88 ml        Physical Exam: Vital Signs Blood pressure (!) 131/91, pulse 97, temperature 98.6 F (37 C), temperature source Oral, resp. rate 18, height 6' (1.829 m), weight 73.6 kg, SpO2 98 %.   Constitutional: No distress . Vital signs reviewed. HEENT: NCAT, EOMI, oral membranes moist Neck: supple Cardiovascular: RRR without murmur. No JVD    Respiratory/Chest: CTA Bilaterally without wheezes or rales. Normal effort    GI/Abdomen: BS +, non-tender, non-distended Ext: no clubbing, cyanosis, or edema Psych: pleasant and cooperative  Skin: No evidence of breakdown, no evidence of rash Neurologic: Cranial nerves II through XII intact, motor strength is 5/5 in RIght  deltoid, bicep, tricep, grip. LUE 3+ to 4-/5. Left and RIght  hip flexor, knee extensors, ankle dorsiflexor and plantar flexor Sensory exam normal sensation to light touch and proprioception in right  upper and lower extremities Left UE absent LT sensation with decreased [facility]  Musculoskeletal: Full range of motion in all 4 extremities. No joint swelling   Assessment/Plan: 1. Functional deficits which require 3+ hours per day of interdisciplinary therapy  in a comprehensive inpatient rehab setting. Physiatrist is providing close team supervision and 24 hour management of active medical problems listed below. Physiatrist and rehab team continue to assess barriers to discharge/monitor patient progress toward functional and medical goals  Care Tool:  Bathing    Body parts bathed by patient: Right arm, Left arm, Chest, Abdomen, Front perineal area, Buttocks, Right upper leg, Left upper leg, Right lower leg, Left lower leg, Face         Bathing assist Assist Level: Supervision/Verbal cueing     Upper Body Dressing/Undressing Upper body dressing   What is the patient wearing?: Pull over shirt    Upper body assist Assist Level: Supervision/Verbal cueing    Lower Body Dressing/Undressing Lower body dressing      What is the patient wearing?: Underwear/pull up, Pants     Lower body assist Assist for lower body dressing: Supervision/Verbal cueing     Toileting Toileting    Toileting assist Assist for toileting: Supervision/Verbal cueing     Transfers Chair/bed transfer  Transfers assist     Chair/bed transfer assist level: Contact Guard/Touching assist     Locomotion Ambulation   Ambulation assist      Assist level: Contact  Guard/Touching assist Assistive device: No Device Max distance: 175'   Walk 10 feet activity   Assist     Assist level: Contact Guard/Touching assist Assistive device: No Device   Walk 50 feet activity   Assist    Assist level: Contact Guard/Touching assist Assistive device: No Device    Walk 150 feet activity   Assist    Assist level: Contact Guard/Touching assist Assistive device: No Device    Walk 10 feet on uneven surface  activity   Assist     Assist level: Contact Guard/Touching assist     Wheelchair     Assist Is the patient using a wheelchair?: Yes Type of Wheelchair: Manual    Wheelchair assist level: Dependent - Patient 0% Max wheelchair  distance: 150'    Wheelchair 50 feet with 2 turns activity    Assist        Assist Level: Dependent - Patient 0%   Wheelchair 150 feet activity     Assist      Assist Level: Dependent - Patient 0%   Blood pressure (!) 131/91, pulse 97, temperature 98.6 F (37 C), temperature source Oral, resp. rate 18, height 6' (1.829 m), weight 73.6 kg, SpO2 98 %.  Medical Problem List and Plan: 1. 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 Cocaine + tobacco abuse, has cardiomyopathy but TEE neg for clot or veg             -patient may shower             -ELOS/Goals: 5-7 days S             -Continue CIR therapies including PT, OT  2.  Antithrombotics: -DVT/anticoagulation:  Pharmaceutical: Lovenox             -antiplatelet therapy: DAPT X 3 months followed by ASA alone.  3. Pain Management: Tylenol prn.  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  4. Mood/Behavior/Sleep: LCSW to follow for evaluation and support.              -antipsychotic agents: N/A 5. Neuropsych/cognition: This patient is capable of making decisions on his own behalf. 6. Skin/Wound Care: Routine pressure relief measures.  7. Fluids/Electrolytes/Nutrition: Monitor I/O. Check CMET in am.  8. NICM EF-25-30%: Strict I/O- 1800 cc FR w/heart healthy diet.  --Monitor for signs of overload. Check wts daily.              --continue Losartan, Coreg, Lipitor, Jardiance.   9. HTN: Monitor BP TID--poorly controlled. ON Norvasc, Losartan, increase Coreg to 12.'5mg'$  BID  Vitals:   01/03/23 0300 01/03/23 0800  BP: 103/86 (!) 131/91  Pulse: 89 97  Resp: 18 18  Temp: 98 F (36.7 C) 98.6 F (37 C)  SpO2: 97% 98%   3/9 BP borderline--monitor DBP--no changes right now  10. 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 Discussed expected duration of antibiotics and likely length of CIR admission. May need to touch base with ID regarding if he can be  transitioned to oral antibiotic on discharge.  11. OSA: Has not had CPAP for 6 months. Does not have PCP. 12. H/o cocaine/THC abuse:  Encourage cessation/healthy lifestyle. Neuropsych to counsel 13. Tachycardia: magnesium reviewed and normal. Increase Coreg to 12.'5mg'$  BID    LOS: 3 days A FACE TO FACE EVALUATION WAS PERFORMED  Meredith Staggers 01/03/2023, 10:34 AM

## 2023-01-04 DIAGNOSIS — I63511 Cerebral infarction due to unspecified occlusion or stenosis of right middle cerebral artery: Secondary | ICD-10-CM | POA: Diagnosis not present

## 2023-01-04 DIAGNOSIS — I10 Essential (primary) hypertension: Secondary | ICD-10-CM | POA: Diagnosis not present

## 2023-01-04 DIAGNOSIS — I428 Other cardiomyopathies: Secondary | ICD-10-CM | POA: Diagnosis not present

## 2023-01-04 NOTE — Progress Notes (Signed)
Occupational Therapy Session Note  Patient Details  Name: Jeremy Fritz MRN: [id number] Date of Birth: 1981/08/01  Today's Date: 01/04/2023 OT Individual Time: 0800-0900 OT Individual Time Calculation (min): 60 min    Short Term Goals: Week 1:  OT Short Term Goal 1 (Week 1): STGs = LTGs  Skilled Therapeutic Interventions/Progress Updates:    Skilled OT Session (1): Patient partially awake upon arrival, indicating the he was able to get a peaceful nights rest. The pt was able to come from L sidelying to EOB with S.  The pt was able to come from EOB to standing for ambulating to the restroom free of a device with close S with IV pole in close proximity. The pt was able to complete toileting with close S. The pt exited the restroom and was able to wash his face and hands with close S. The pt was in agreement with completing simulated task in UB/LB dressing using theraband.  The pt was able to complete both task with initial demonstration and vc's.  The pt was able to complete UB theraex using 3lb dumb bell for bicep curls, horizontal abduction, and shld flexion 2 sets of 10 with rest breaks as needed.  The pt was encourage to wt bear through the  L hand to improve AROM of the digit to reduce his instances for contracture.  The pt was able to complete the session by removing object from medium grade Thera putty to improve strength and functional mobility of the hand and surrounding structures.  At the end of the treatment session , the pt remained in his room and returned to bed LOF with his call light and bedside table in close distance.  The pt had no report of pain at the time of treatment.  All additional needs were addressed prior to exiting the room.      Skilled OT Session (2): The pt was able to transfer for EOB to standing with close S.  He was able to ambulate to the restroom free of a device. The pt was transported to the gym using his w/c and was able to complete UB theraex using a 3lb  dowel 2 sets of 15 for shld flexion, horizontal abduction,and shld rotation with 2 rest breaks. The pt was able to complete the UB cycle for 10 Mins in duration while stand with the w/c position behind him. The pt went on to complete a peg board exercise by replicating a pattern while standing, the pt was able to insert 14 of the pieces to the pattern the a glove applied to his L hand for additional grip for > success with FM manipulation.The pt returned to his room and was able to complete toileting with close S. The pt was able to wash his hands and return to EOB at the same LOF. All additional needs were addressed prior to exiting the room.  Therapy Documentation Precautions:  Precautions Precautions: Fall Precaution Comments: L inattention Restrictions Weight Bearing Restrictions: No  Therapy/Group: Individual Therapy  Yvonne Kendall 01/04/2023, 12:48 PM

## 2023-01-04 NOTE — Progress Notes (Signed)
Physical Therapy Session Note  Patient Details  Name: Jeremy Fritz MRN: [id number] Date of Birth: [birth year]  Today's Date: 01/04/2023 PT Individual Time: 0947-1100 PT Individual Time Calculation (min): 73 min   Short Term Goals: Week 1:  PT Short Term Goal 1 (Week 1): STGs = LTGs  Skilled Therapeutic Interventions/Progress Updates: Pt presents amb out of BR w/ IV pole.  Pt amb 150' w/o AD, but pushing IV pole w/ L hand, verbal cues for positioning to avoid tripping w/ LLE and then in confined space to front.  Pt performed standing cornhole performance on balance circle: 1) reaching down to left(stool height) w/ L hand and throwing w/ right  2)reaching down to left w/ R hand, flexed knees (verbal cues for noted eversion on L foot) 3) stepping w/ left foot onto wedge and reaching down to left for beanbags, performing cornhole w/ 1 beanbag and then progressing to lunge position x 3 beanbags. 4) stepping over hockey stock w/ left to attach playing cards to mirror w/ left hand.  Pt tolerated w/o LOB.  Pt returned to room and sat in w/c w/ all needs in reach.     Therapy Documentation Precautions:  Precautions Precautions: Fall Precaution Comments: L inattention Restrictions Weight Bearing Restrictions: No General:   Vital Signs:   Pain:0/10 Pain Assessment Pain Scale: 0-10 Pain Score: 0-No pain    Therapy/Group: Individual Therapy  Ladoris Gene 01/04/2023, 11:05 AM

## 2023-01-04 NOTE — Progress Notes (Signed)
PROGRESS NOTE   Subjective/Complaints:  Pt without new complaints. Sleeping well. No pain.   ROS: Patient denies fever, rash, sore throat, blurred vision, dizziness, nausea, vomiting, diarrhea, cough, shortness of breath or chest pain, joint or back/neck pain, headache, or mood change.   Objective:   No results found. No results for input(s): "WBC", "HGB", "HCT", "PLT" in the last 72 hours.  No results for input(s): "NA", "K", "CL", "CO2", "GLUCOSE", "BUN", "CREATININE", "CALCIUM" in the last 72 hours.   Intake/Output Summary (Last 24 hours) at 01/04/2023 0937 Last data filed at 01/04/2023 0400 Gross per 24 hour  Intake 472 ml  Output 2275 ml  Net -1803 ml        Physical Exam: Vital Signs Blood pressure 108/62, pulse 99, temperature 98.1 F (36.7 C), temperature source Oral, resp. rate 18, height 6' (1.829 m), weight 75.2 kg, SpO2 100 %.   Constitutional: No distress . Vital signs reviewed. HEENT: NCAT, EOMI, oral membranes moist Neck: supple Cardiovascular: RRR without murmur. No JVD    Respiratory/Chest: CTA Bilaterally without wheezes or rales. Normal effort    GI/Abdomen: BS +, non-tender, non-distended Ext: no clubbing, cyanosis, or edema Psych: pleasant and cooperative   Skin: No evidence of breakdown, no evidence of rash Neurologic: Cranial nerves II through XII intact, motor strength is 5/5 in RIght  deltoid, bicep, tricep, grip. LUE 3+ to 4-/5. Left and RIght  hip flexor, knee extensors, ankle dorsiflexor and plantar flexor--stable motor exam Sensory exam normal sensation to light touch and proprioception in right  upper and lower extremities Left UE absent LT sensation with decreased [facility]  Musculoskeletal: Full range of motion in all 4 extremities. No joint swelling   Assessment/Plan: 1. Functional deficits which require 3+ hours per day of interdisciplinary therapy in a comprehensive inpatient rehab  setting. Physiatrist is providing close team supervision and 24 hour management of active medical problems listed below. Physiatrist and rehab team continue to assess barriers to discharge/monitor patient progress toward functional and medical goals  Care Tool:  Bathing    Body parts bathed by patient: Right arm, Left arm, Chest, Abdomen, Front perineal area, Buttocks, Right upper leg, Left upper leg, Right lower leg, Left lower leg, Face         Bathing assist Assist Level: Supervision/Verbal cueing     Upper Body Dressing/Undressing Upper body dressing   What is the patient wearing?: Pull over shirt    Upper body assist Assist Level: Supervision/Verbal cueing    Lower Body Dressing/Undressing Lower body dressing      What is the patient wearing?: Underwear/pull up, Pants     Lower body assist Assist for lower body dressing: Supervision/Verbal cueing     Toileting Toileting    Toileting assist Assist for toileting: Supervision/Verbal cueing     Transfers Chair/bed transfer  Transfers assist     Chair/bed transfer assist level: Contact Guard/Touching assist     Locomotion Ambulation   Ambulation assist      Assist level: Contact Guard/Touching assist Assistive device: No Device Max distance: 175'   Walk 10 feet activity   Assist     Assist level: Contact Guard/Touching assist Assistive device:  No Device   Walk 50 feet activity   Assist    Assist level: Contact Guard/Touching assist Assistive device: No Device    Walk 150 feet activity   Assist    Assist level: Contact Guard/Touching assist Assistive device: No Device    Walk 10 feet on uneven surface  activity   Assist     Assist level: Contact Guard/Touching assist     Wheelchair     Assist Is the patient using a wheelchair?: Yes Type of Wheelchair: Manual    Wheelchair assist level: Dependent - Patient 0% Max wheelchair distance: 150'    Wheelchair 50 feet  with 2 turns activity    Assist        Assist Level: Dependent - Patient 0%   Wheelchair 150 feet activity     Assist      Assist Level: Dependent - Patient 0%   Blood pressure 108/62, pulse 99, temperature 98.1 F (36.7 C), temperature source Oral, resp. rate 18, height 6' (1.829 m), weight 75.2 kg, SpO2 100 %.  Medical Problem List and Plan: 1. 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 Cocaine + tobacco abuse, has cardiomyopathy but TEE neg for clot or veg             -patient may shower             -ELOS/Goals: 5-7 days S             -Continue CIR therapies including PT, OT   2.  Antithrombotics: -DVT/anticoagulation:  Pharmaceutical: Lovenox             -antiplatelet therapy: DAPT X 3 months followed by ASA alone.  3. Pain Management: Tylenol prn.  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  4. Mood/Behavior/Sleep: LCSW to follow for evaluation and support.              -antipsychotic agents: N/A 5. Neuropsych/cognition: This patient is capable of making decisions on his own behalf. 6. Skin/Wound Care: Routine pressure relief measures.  7. Fluids/Electrolytes/Nutrition: Monitor I/O. Check CMET in am.  8. NICM EF-25-30%: Strict I/O- 1800 cc FR w/heart healthy diet.  --Monitor for signs of overload. Check wts daily.              --continue Losartan, Coreg, Lipitor, Jardiance.   9. HTN: Monitor BP TID--poorly controlled. ON Norvasc, Losartan, increase Coreg to 12.'5mg'$  BID  Vitals:   01/03/23 1940 01/04/23 0508  BP: (!) 112/96 108/62  Pulse: 90 99  Resp: 18 18  Temp: 97.6 F (36.4 C) 98.1 F (36.7 C)  SpO2: 100% 100%   3/9-10 BP borderline to normal--monitor DBP--no changes today  10. 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 Discussed expected duration of antibiotics and likely length of CIR admission. May need to touch base with ID regarding if he can be transitioned to oral  antibiotic on discharge.  11. OSA: Has not had CPAP for 6 months. Does not have PCP. 12. H/o cocaine/THC abuse:  Encourage cessation/healthy lifestyle. Neuropsych to counsel 13. Tachycardia: magnesium reviewed and normal. Increase Coreg to 12.'5mg'$  BID    LOS: 4 days A FACE TO FACE EVALUATION WAS PERFORMED  Meredith Staggers 01/04/2023, 9:37 AM

## 2023-01-05 DIAGNOSIS — I63511 Cerebral infarction due to unspecified occlusion or stenosis of right middle cerebral artery: Secondary | ICD-10-CM | POA: Diagnosis not present

## 2023-01-05 LAB — BASIC METABOLIC PANEL WITH GFR
Anion gap: 12 (ref 5–15)
BUN: 16 mg/dL (ref 6–20)
CO2: 21 mmol/L — ABNORMAL LOW (ref 22–32)
Calcium: 8.9 mg/dL (ref 8.9–10.3)
Chloride: 105 mmol/L (ref 98–111)
Creatinine, Ser: 1.07 mg/dL (ref 0.61–1.24)
GFR, Estimated: >60 mL/min (ref >60)
Glucose, Bld: 119 mg/dL — ABNORMAL HIGH (ref 70–99)
Potassium: 3.9 mmol/L (ref 3.5–5.1)
Sodium: 138 mmol/L (ref 135–145)

## 2023-01-05 LAB — CBC
HCT: 36.3 % — ABNORMAL LOW (ref 39.0–52.0)
Hemoglobin: 12 g/dL — ABNORMAL LOW (ref 13.0–17.0)
MCH: 29 pg (ref 26.0–34.0)
MCHC: 33.1 g/dL (ref 30.0–36.0)
MCV: 87.7 fL (ref 80.0–100.0)
Platelets: 334 10*3/uL (ref 150–400)
RBC: 4.14 MIL/uL — ABNORMAL LOW (ref 4.22–5.81)
RDW: 15.4 % (ref 11.5–15.5)
WBC: 6.4 10*3/uL (ref 4.0–10.5)
nRBC: 0 % (ref 0.0–0.2)

## 2023-01-05 MED ORDER — ALBUTEROL SULFATE (2.5 MG/3ML) 0.083% IN NEBU
2.5000 mg | INHALATION_SOLUTION | Freq: Every day | RESPIRATORY_TRACT | Status: DC
  Administered 2023-01-05 – 2023-01-06 (×2): 2.5 mg via RESPIRATORY_TRACT
  Filled 2023-01-05 (×2): qty 3

## 2023-01-05 NOTE — Progress Notes (Signed)
Physical Therapy Session Note  Patient Details  Name: Jeremy Fritz MRN: [id number] Date of Birth: 1981/09/01  Today's Date: 01/05/2023 PT Individual Time: 0805-0902 PT Individual Time Calculation (min): 57 min  PT Individual Time: 1105-1202 PT Individual Time Calculation (min): 57 min  Short Term Goals: Week 1:  PT Short Term Goal 1 (Week 1): STGs = LTGs  Skilled Therapeutic Interventions/Progress Updates:    Session 1:  Pt received supine asleep in bed. Therapist awoke pt upon entrance and pt agreed to PT services. Pt expressed that he was not feeling well. Expresses moderate stomach cramping which began after breakfast. Pt pulled IV out last night on accident which affected his sleep. Pt transferred to sitting at EOB Mod I, and donned shoes w/ Set up assist. Pt hooked up to IV pole and was able to self-manage it throughout the session. Mod Vc given for pt to avoid hitting pole w/ LLE. Pt Ambulated to the bathroom w/ close supervision and remained standing to void. Pt engaged in hand hygiene standing at the sink w/ supervision.  Pt performed ~251f to main therapy gym w/ CGA and no AD and performed agility ladder tasks to work on balance, motor control and LE NMR.  -Side steps 2x w/ CGA -Forward steps w/ lateral cone taps 2x w/ CGA and Min A for stability due to minor L lateral LOB when stand on LLE. Pt cued to find balance on stance leg, first before attempting to tap cone.  -Forward steps w/ lateral LE swing around cone x2 vc to use LE to circle around cone and move slowly to emphasize motor control and balance.   Pt engaged in cornhole in tandem stance 2x10 throws, 6 w/ RUE and 4 w/ LUE to encourage functional use of L hand w/ CGA. Pt required mod vc to maintain tandem stance when reaching to the L for beanbags. R foot in front of L demonstrated more of a challenge balance wise for pt.  Pt ambulated back to room ~250 ft w/ close supervision. Vc for pt to pick up L knee to avoid  dragging. Pt demonstrates an increased LLE swing when advancing L limb. Pt left sitting in room chair w/ all needs met.   Session 2:  Pt received sitting at EOB and agreeable to PT services. Pt remained hooked up to IV pole and managed w/ SBA throughout session. Pt performed 6MWT. Educated on the rules of the assessment pt performed 1019 ft of gait w/o stopping.   Engaged in balance and LE NMR exercises inside parallel bars w/ CGA/Supervision A: - Wooden Rocker board side to side x10 to encourage functional wb through LLE. Squats 5x,  Pt cued to avoid squat to the point of pain in L knee to avoid any discomfort.  - Balancing on BOSU flat top up 2x30 seconds, CW/CCW Circles x5 w/ cues to bend knees slightly  UE NMR using 1# bar --> 2#bar for increased UE activation pt instructed to hit ball w/ arms extended. Pt performed exercises first in sitting w/ SBA to assist w/ L hand grip if needed. Pt demonstrated improved UE endurance and grip. Therapist progressed exercise to standing w/ CGA.   Pt ambulated back to room w/ close supervision and left sitting in recliner chair w/ all needs met     Therapy Documentation Precautions:  Precautions Precautions: Fall Precaution Comments: L inattention Restrictions Weight Bearing Restrictions: No General:   Vital Signs:   Pain: Pain Assessment Pain Scale: 0-10 Pain Score:  0-No pain Mobility:   Locomotion :    Trunk/Postural Assessment :    Balance:   Exercises:   Other Treatments:      Therapy/Group: Individual Therapy  Rochella Benner 01/05/2023, 10:10 AM

## 2023-01-05 NOTE — Progress Notes (Signed)
Patient ID: Jeremy Fritz, male   DOB: [birth year], 42 y.o.   MRN: [id number] Met with the patient to review current situation, rehab process, team conference and plan of care. Reviewed IV abx through 01/09/23; then dose of Bicillin. Patient reports left side weakness improving but still has fine motor issues.  Reviewed secondary risks for strokes including HTN, HLD and HF. Reviewed medications and dietary modification recommendations. Reported will be living with someone with DM and mother aware of heart healthy meal prep tips. Given information on PCP; will need to cross check insurance network coverage and set up appointment. Also reviewed access to my Chart account code; current code expires 01/08/23. Continue to follow along to address educational needs to facilitate preparation for discharge. Margarito Liner

## 2023-01-05 NOTE — Progress Notes (Signed)
Occupational Therapy Session Note  Patient Details  Name: Jeremy Fritz MRN: [id number] Date of Birth: [birth year]  Today's Date: 01/05/2023 OT Individual Time: [id number] OT Individual Time Calculation (min): 37 min  ( 8 mins missed d/t therapist running late from previous session )    Short Term Goals: Week 1:  OT Short Term Goal 1 (Week 1): STGs = LTGs  Skilled Therapeutic Interventions/Progress Updates:  Pt greeted seated in w/c, pt agreeable to OT intervention. Session focus on BADL reeducation, functional mobility, dynamic standing balance and decreasing overall caregiver burden.      Pt requesting to shower, pt completed ambulatory toilet transfer with no AD and close supervision with no LOB. Supervision for 3/3 toileting tasks, continent BM. Pt entered into walkin shower with no AD and supervision. Pt completed bathing from shower seat with supervision. Pt exited shower in same manner and completed dressing from sitting/standing with set- up assist.   Pt stood at sink for oral hygiene and grooming tasks MODI. Pt already using compensatory strategies as needed at sink for LUE [facility] tasks.                  Ended session with pt seated EOB with nurse present.            Therapy Documentation Precautions:  Precautions Precautions: Fall Precaution Comments: L inattention Restrictions Weight Bearing Restrictions: No    Pain: no pain    Therapy/Group: Individual Therapy  Corinne Ports [facility] 01/05/2023, 12:20 PM

## 2023-01-05 NOTE — Progress Notes (Signed)
PROGRESS NOTE   Subjective/Complaints:  Discussed abx management with IT, Dr Juleen China today C/o  wheezing,cough , tightness at noc, had albuterol neb PTA which was helpful, hx of smoking for >2oyrs although less freq during incarceration  ROS: Patient denies CP, SOB, N/V/D Objective:   No results found. No results for input(s): "WBC", "HGB", "HCT", "PLT" in the last 72 hours.  No results for input(s): "NA", "K", "CL", "CO2", "GLUCOSE", "BUN", "CREATININE", "CALCIUM" in the last 72 hours.   Intake/Output Summary (Last 24 hours) at 01/05/2023 0942 Last data filed at 01/04/2023 1938 Gross per 24 hour  Intake 476 ml  Output 1775 ml  Net -1299 ml         Physical Exam: Vital Signs Blood pressure 108/79, pulse 91, temperature 97.9 F (36.6 C), temperature source Oral, resp. rate 18, height 6' (1.829 m), weight 75.2 kg, SpO2 99 %.   General: No acute distress Mood and affect are appropriate Heart: Regular rate and rhythm no rubs murmurs or extra sounds Lungs: Clear to auscultation, breathing unlabored, no rales or wheezes Abdomen: Positive bowel sounds, soft nontender to palpation, nondistended Extremities: No clubbing, cyanosis, or edema Skin: No evidence of breakdown, no evidence of rash, LUE IV site CDI  Neurologic: Cranial nerves II through XII intact, motor strength is 5/5 in RIght  deltoid, bicep, tricep, grip. LUE 4/5 Delt, bic, tri, 3- finger flex/ext  Left UE absent LT sensation with decreased [place], able to touch finger to thumb slowly on the left side   Musculoskeletal: Full range of motion in all 4 extremities. No joint swelling   Assessment/Plan: 1. Functional deficits which require 3+ hours per day of interdisciplinary therapy in a comprehensive inpatient rehab setting. Physiatrist is providing close team supervision and 24 hour management of active medical problems listed below. Physiatrist and rehab team  continue to assess barriers to discharge/monitor patient progress toward functional and medical goals  Care Tool:  Bathing    Body parts bathed by patient: Right arm, Left arm, Chest, Abdomen, Front perineal area, Buttocks, Right upper leg, Left upper leg, Right lower leg, Left lower leg, Face         Bathing assist Assist Level: Supervision/Verbal cueing     Upper Body Dressing/Undressing Upper body dressing   What is the patient wearing?: Pull over shirt    Upper body assist Assist Level: Supervision/Verbal cueing    Lower Body Dressing/Undressing Lower body dressing      What is the patient wearing?: Underwear/pull up, Pants     Lower body assist Assist for lower body dressing: Supervision/Verbal cueing     Toileting Toileting    Toileting assist Assist for toileting: Supervision/Verbal cueing     Transfers Chair/bed transfer  Transfers assist     Chair/bed transfer assist level: Contact Guard/Touching assist     Locomotion Ambulation   Ambulation assist      Assist level: Contact Guard/Touching assist Assistive device: No Device Max distance: 150   Walk 10 feet activity   Assist     Assist level: Contact Guard/Touching assist Assistive device: No Device   Walk 50 feet activity   Assist    Assist level: Contact  Guard/Touching assist Assistive device: No Device    Walk 150 feet activity   Assist    Assist level: Contact Guard/Touching assist Assistive device: No Device    Walk 10 feet on uneven surface  activity   Assist     Assist level: Contact Guard/Touching assist     Wheelchair     Assist Is the patient using a wheelchair?: Yes Type of Wheelchair: Manual    Wheelchair assist level: Dependent - Patient 0% Max wheelchair distance: 150'    Wheelchair 50 feet with 2 turns activity    Assist        Assist Level: Dependent - Patient 0%   Wheelchair 150 feet activity     Assist      Assist  Level: Dependent - Patient 0%   Blood pressure 108/79, pulse 91, temperature 97.9 F (36.6 C), temperature source Oral, resp. rate 18, height 6' (1.829 m), weight 75.2 kg, SpO2 99 %.  Medical Problem List and Plan: 1. 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 Cocaine + tobacco abuse, has cardiomyopathy but TEE neg for clot or veg             -patient may shower             -ELOS/Goals: 5-7 days S             -Continue CIR therapies including PT, OT   2.  Antithrombotics: -DVT/anticoagulation:  Pharmaceutical: Lovenox             -antiplatelet therapy: DAPT X 3 months followed by ASA alone.  3. Pain Management: Tylenol prn.  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  4. Mood/Behavior/Sleep: LCSW to follow for evaluation and support.              -antipsychotic agents: N/A 5. Neuropsych/cognition: This patient is capable of making decisions on his own behalf. 6. Skin/Wound Care: Routine pressure relief measures.  7. Fluids/Electrolytes/Nutrition: Monitor I/O. Check CMET in am.  8. NICM EF-25-30%: Strict I/O- 1800 cc FR w/heart healthy diet.  --Monitor for signs of overload. Check wts daily.              --continue Losartan, Coreg, Lipitor, Jardiance.   9. HTN: Monitor BP TID--poorly controlled. OffNorvasc, Losartan, On Coreg to 12.'5mg'$  BID  Vitals:   01/04/23 1938 01/05/23 0312  BP: 103/65 108/79  Pulse: 91 91  Resp: 18 18  Temp: 98.5 F (36.9 C) 97.9 F (36.6 C)  SpO2: 100% 99%   3/11 controlled   10. 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 IM on 3/16, has ID f/u appt in April  11. OSA: Has not had CPAP for 6 months. Does not have PCP. 12. H/o cocaine/THC abuse:  Encourage cessation/healthy lifestyle. Neuropsych to counsel 13. Tachycardia: magnesium reviewed and normal. Increase Coreg to 12.'5mg'$  BID  14.  Probable COPD- alb neb qhs  LOS: 5 days A FACE TO FACE EVALUATION WAS PERFORMED  Charlett Blake 01/05/2023, 9:42 AM

## 2023-01-05 NOTE — Progress Notes (Signed)
Occupational Therapy Session Note  Patient Details  Name: Jeremy Fritz MRN: [id number] Date of Birth: [birth year]  Today's Date: 01/05/2023 OT Individual Time: 1405-1450 OT Individual Time Calculation (min): 45 min    Short Term Goals: Week 1:  OT Short Term Goal 1 (Week 1): STGs = LTGs  Skilled Therapeutic Interventions/Progress Updates:    Pt received in recliner. Stated his stomach was upset due to a bad breakfast. Pt opted to stay close to the toilet just in case.   Session focused on LUE NMR: - shoulder AROM with overhead reaches focusing on moving L arm symmetrically with R arm.  Cues to fully extend tricep. - standing wall climbs alternating hands in long stretch and then wall push ups. Pt having difficulty to fully extend arm initially so worked on isometric elb extension with pushing into the wall.  Then added on wall push ups with A to stabilize L wrist on wall. Then moved to pushups off bed rail with pt in semi plank.  A to keep L wrist stabilized.    Pt then ambulated to and from the bathroom to urinate with supervision, Returned to recliner.  Worked on forearm strength with with wrist ext, wrist flexion, and radial deviation using 2lb weight. Cues to fully attend to hand as he occasionally dropped the weight.  Then pt held onto a 3#  dowel bar holding bar at red collar on center of bar with bar vertical and pushing bar forward and back.  Pt cued to not drop bar. He did well with this activity.    Pt repeated all exercises. 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 Talked with pt about a goal of our therapies is to improve dual tasking as he will need to develop that skill as he returns to school.  He will also need to have significantly improved Kell.    Pt is very motivated and follows through with his room exercises well. Pt in room with all  needs met.     Therapy Documentation Precautions:  Precautions Precautions: Fall Precaution Comments: L inattention Restrictions Weight Bearing Restrictions: No General: General OT Amount of Missed Time: 8 Minutes Pain:  5/10 back pain - RN made aware ADL: ADL Eating: Independent Grooming: Independent Upper Body Bathing: Supervision/safety Where Assessed-Upper Body Bathing: Shower Lower Body Bathing: Supervision/safety Where Assessed-Lower Body Bathing: Shower Upper Body Dressing: Independent Where Assessed-Upper Body Dressing: Edge of bed Lower Body Dressing: Supervision/safety Where Assessed-Lower Body Dressing: Edge of bed Toileting: Supervision/safety Where Assessed-Toileting: Glass blower/designer: Close supervision Armed forces technical officer Method: Magazine features editor: Close supervision Social research officer, government Method: Heritage manager: Grab bars, Shower seat with back    Therapy/Group: Individual Therapy  [place] 01/05/2023, 1:27 PM

## 2023-01-06 DIAGNOSIS — I63511 Cerebral infarction due to unspecified occlusion or stenosis of right middle cerebral artery: Secondary | ICD-10-CM | POA: Diagnosis not present

## 2023-01-06 DIAGNOSIS — R7301 Impaired fasting glucose: Secondary | ICD-10-CM | POA: Insufficient documentation

## 2023-01-06 MED ORDER — ALBUTEROL SULFATE (2.5 MG/3ML) 0.083% IN NEBU: 2.5000 mg | INHALATION_SOLUTION | Freq: Four times a day (QID) | RESPIRATORY_TRACT | Status: DC | PRN

## 2023-01-06 NOTE — Discharge Instructions (Addendum)
Inpatient Rehab Discharge Instructions  Jeremy Fritz Discharge date and time:  01/10/23  Activities/Precautions/ Functional Status: Activity: no lifting, driving, or strenuous exercise  till cleared by MD Diet: cardiac diet--low fat/low salt. Limit fluids to 1800 cc/day.  Wound Care: none needed   Functional status:  ___ No restrictions     ___ Walk up steps independently ___ 24/7 supervision/assistance   ___ Walk up steps with assistance _X__ Intermittent supervision/assistance  _X__ Bathe/dress independently ___ Walk with walker     ___ Bathe/dress with assistance ___ Walk Independently    ___ Shower independently ___ Walk with assistance    ___ Shower with assistance _X__ No alcohol, tobacco, cocaine   ___ Return to work/school ________   COMMUNITY REFERRALS UPON DISCHARGE:     Outpatient: PT     OT               Agency:[facility]  Phone: [phone]              Appointment Date/Time: Please allow 3-5 days for scheduling to reach out.    Special Instructions:   STROKE/TIA DISCHARGE INSTRUCTIONS SMOKING Cigarette smoking nearly doubles your risk of having a stroke & is the single most alterable risk factor  If you smoke or have smoked in the last 12 months, you are advised to quit smoking for your health. Most of the excess cardiovascular risk related to smoking disappears within a year of stopping. Ask you doctor about anti-smoking medications [place] Quit Line: 1-800-QUIT NOW Free Smoking Cessation Classes [phone]  CHOLESTEROL Know your levels; limit fat & cholesterol in your diet  Lipid Panel     Component Value Date/Time   CHOL 164 12/26/2022 1217   TRIG 82 12/26/2022 1217   HDL 39 (L) 12/26/2022 1217   CHOLHDL 4.2 12/26/2022 1217   VLDL 16 12/26/2022 1217   LDLCALC 109 (H) 12/26/2022 1217     Many patients benefit from treatment even if their cholesterol is at goal. Goal: Total Cholesterol (CHOL) less than 160 Goal:  Triglycerides (TRIG) less than  150 Goal:  HDL greater than 40 Goal:  LDL (LDLCALC) less than 100   BLOOD PRESSURE American Stroke Association blood pressure target is less that 120/80 mm/Hg  Your discharge blood pressure is:  BP: 105/68 Monitor your blood pressure Limit your salt and alcohol intake Many individuals will require more than one medication for high blood pressure  DIABETES (A1c is a blood sugar average for last 3 months) Goal HGBA1c is under 7% (HBGA1c is blood sugar average for last 3 months)  Diabetes: No known diagnosis of diabetes    Lab Results  Component Value Date   HGBA1C 5.9 (H) 12/26/2022    Your HGBA1c can be lowered with medications, healthy diet, and exercise. Check your blood sugar as directed by your physician Call your physician if you experience unexplained or low blood sugars.  PHYSICAL ACTIVITY/REHABILITATION Goal is 30 minutes at least 4 days per week  Activity: Increase activity slowly, and No driving, Therapies:  Return to work: to be decided after follow up appt Activity decreases your risk of heart attack and stroke and makes your heart stronger.  It helps control your weight and blood pressure; helps you relax and can improve your mood. Participate in a regular exercise program. Talk with your doctor about the best form of exercise for you (dancing, walking, swimming, cycling).  DIET/WEIGHT Goal is to maintain a healthy weight  Your discharge diet is:  Diet  Order             Diet Heart Room service appropriate? Yes; Fluid consistency: Thin; Fluid restriction: 1800 mL Fluid  Diet effective now                   liquids Your height is:  Height: 6' (182.9 cm) Your current weight is: Weight: 70.8 kg Your Body Mass Index (BMI) is:  BMI (Calculated): 21.16 Following the type of diet specifically designed for you will help prevent another stroke. Your goal weight range is:   Your goal Body Mass Index (BMI) is 19-24. Healthy food habits can help reduce 3 risk factors for  stroke:  High cholesterol, hypertension, and excess weight.  RESOURCES Stroke/Support Group:  Call [phone]   STROKE EDUCATION PROVIDED/REVIEWED AND GIVEN TO PATIENT Stroke warning signs and symptoms How to activate emergency medical system (call 911). Medications prescribed at discharge. Need for follow-up after discharge. Personal risk factors for stroke. Pneumonia vaccine given:  Flu vaccine given:  My questions have been answered, the writing is legible, and I understand these instructions.  I will adhere to these goals & educational materials that have been provided to me after my discharge from the hospital.     My questions have been answered and I understand these instructions. I will adhere to these goals and the provided educational materials after my discharge from the hospital.  Patient/Caregiver Signature _______________________________ Date __________  Clinician Signature _______________________________________ Date __________  Please bring this form and your medication list with you to all your follow-up doctor's appointments.

## 2023-01-06 NOTE — Discharge Summary (Incomplete)
Physician Discharge Summary  Patient ID: Jeremy Fritz MRN: [id number] DOB/AGE: [birth year] 42 y.o.  Admit date: 12/31/2022 Discharge date: 01/10/2023  Discharge Diagnoses:  Principal Problem:   Acute ischemic right MCA stroke [facility]) Active Problems:   HTN (hypertension)   Neurosyphilis in adult   Polysubstance abuse ([place])   Impaired fasting glucose   Discharged Condition: stable  Significant Diagnostic Studies: DG CHEST PORT 1 VIEW  Result Date: 01/08/2023 CLINICAL DATA:  Shortness of breath. EXAM: PORTABLE CHEST 1 VIEW COMPARISON:  None Available. FINDINGS: Hazy bilateral airspace disease. A few Kerley lines are present. Borderline heart size for technique. No effusion or pneumothorax. IMPRESSION: Bilateral airspace disease which could be edema or multifocal pneumonia. Electronically Signed   By: Jorje Guild M.D.   On: 01/08/2023 06:45   DG Knee 1-2 Views Left  Result Date: 01/07/2023 CLINICAL DATA:  Left knee pain EXAM: LEFT KNEE - 1-2 VIEW COMPARISON:  None Available. FINDINGS: There is no evidence of acute fracture. There is mild medial compartment degenerative change. There is a small joint effusion. There is mild soft tissue swelling along the knee. IMPRESSION: No acute osseous abnormality. Small joint effusion and mild soft tissue swelling along the knee. Mild medial compartment osteoarthritis. Electronically Signed   By: Maurine Simmering M.D.   On: 01/07/2023 16:05   ECHO TEE  Result Date: 12/30/2022    TRANSESOPHOGEAL ECHO REPORT   Patient Name:   Jeremy Fritz Date of Exam: 12/30/2022 Medical Rec #:  [id number]       Height:       72.0 in Accession #:    [id number]      Weight:       165.6 lb Date of Birth:  [birth year]      BSA:          1.966 m Patient Age:    85 years        BP:           132/106 mmHg Patient Gender: M               HR:           105 bpm. Exam Location:  Inpatient Procedure: Transesophageal Echo, Color Doppler, Cardiac Doppler and Saline            Contrast  Bubble Study Indications:     Stroke i63.9  History:         Patient has prior history of Echocardiogram examinations, most                  recent 12/26/2022. Risk Factors:Hypertension and Polysubstance                  Abuse.  Sonographer:     Raquel Sarna Senior RDCS Referring Phys:  Cecilie Kicks, R Diagnosing Phys: Kirk Ruths MD PROCEDURE: After discussion of the risks and benefits of a TEE, an informed consent was obtained from the patient. The transesophogeal probe was passed without difficulty through the esophogus of the patient. Sedation performed by different physician. The patient was monitored while under deep sedation. Anesthestetic sedation was provided intravenously by Anesthesiology: 233mg  of Propofol, 100mg  of Lidocaine. The patient developed no complications during the procedure.  IMPRESSIONS  1. No source of embolus identified.  2. Left ventricular ejection fraction, by estimation, is 20 to 25%. The left ventricle has severely decreased function. The left ventricle demonstrates global hypokinesis. The left ventricular internal cavity size was moderately dilated.  3. Right ventricular systolic  function is moderately reduced. The right ventricular size is normal.  4. Left atrial size was mildly dilated. No left atrial/left atrial appendage thrombus was detected.  5. Right atrial size was mildly dilated.  6. The mitral valve is normal in structure. Moderate mitral valve regurgitation.  7. The aortic valve is tricuspid. Aortic valve regurgitation is not visualized.  8. Agitated saline contrast bubble study was negative, with no evidence of any interatrial shunt. FINDINGS  Left Ventricle: Left ventricular ejection fraction, by estimation, is 20 to 25%. The left ventricle has severely decreased function. The left ventricle demonstrates global hypokinesis. The left ventricular internal cavity size was moderately dilated. Right Ventricle: The right ventricular size is normal. Right ventricular systolic  function is moderately reduced. Left Atrium: Left atrial size was mildly dilated. No left atrial/left atrial appendage thrombus was detected. Right Atrium: Right atrial size was mildly dilated. Pericardium: There is no evidence of pericardial effusion. Mitral Valve: The mitral valve is normal in structure. Moderate mitral valve regurgitation. Tricuspid Valve: The tricuspid valve is normal in structure. Tricuspid valve regurgitation is trivial. Aortic Valve: The aortic valve is tricuspid. Aortic valve regurgitation is not visualized. Aortic valve mean gradient measures 2.0 mmHg. Aortic valve peak gradient measures 3.8 mmHg. Pulmonic Valve: The pulmonic valve was normal in structure. Pulmonic valve regurgitation is trivial. Aorta: The aortic root is normal in size and structure. There is minimal (Grade I) plaque involving the descending aorta. IAS/Shunts: No atrial level shunt detected by color flow Doppler. Agitated saline contrast was given intravenously to evaluate for intracardiac shunting. Agitated saline contrast bubble study was negative, with no evidence of any interatrial shunt. Additional Comments: No source of embolus identified. Spectral Doppler performed. AORTIC VALVE AV Vmax:      97.30 cm/s AV Vmean:     64.200 cm/s AV VTI:       0.155 m AV Peak Grad: 3.8 mmHg AV Mean Grad: 2.0 mmHg  AORTA Ao Root diam: 3.10 cm Ao Asc diam:  2.80 cm Kirk Ruths MD Electronically signed by Kirk Ruths MD Signature Date/Time: 12/30/2022/1:04:05 PM    Final    ECHOCARDIOGRAM COMPLETE  Result Date: 12/26/2022    ECHOCARDIOGRAM REPORT   Patient Name:   Jeremy Fritz Date of Exam: 12/26/2022 Medical Rec #:  [id number]       Height:       72.0 in Accession #:    [id number]      Weight:       164.9 lb Date of Birth:  1981-02-15      BSA:          1.963 m Patient Age:    39 years        BP:           124/85 mmHg Patient Gender: M               HR:           100 bpm. Exam Location:  Inpatient Procedure: 2D Echo, Cardiac  Doppler and Color Doppler Indications:    Stroke I63.9  History:        Patient has no prior history of Echocardiogram examinations.                 Stroke; Risk Factors:Hypertension and Current Smoker.  Sonographer:    Ronny Flurry Referring Phys: [id number] ERIC J [place] IMPRESSIONS  1. Left ventricular ejection fraction, by estimation, is 25 to 30%. The left ventricle has severely decreased function. The  left ventricle demonstrates global hypokinesis. The left ventricular internal cavity size was moderately dilated. There is severe  left ventricular hypertrophy of the inferior segment. Left ventricular diastolic function could not be evaluated. There is no LV thrombus visualized.  2. Right ventricular systolic function mildly reduced with normal basal excursion. The right ventricular size is mild to moderately. Tricuspid regurgitation signal is inadequate for assessing PA pressure.  3. Left atrial size was mildly dilated.  4. Right atrial size was mildly dilated.  5. A small pericardial effusion is present. The pericardial effusion is circumferential.  6. MR mechanism appears ventricular functional in nature, with blunting of the right sided pulmonary vein Doppler. The mitral valve is normal in structure. Moderate to severe mitral valve regurgitation.  7. The aortic valve is tricuspid. Aortic valve regurgitation is not visualized. No aortic stenosis is present.  8. The inferior vena cava is dilated in size with <50% respiratory variability, suggesting right atrial pressure of 15 mmHg. FINDINGS  Left Ventricle: Left ventricular ejection fraction, by estimation, is 25 to 30%. The left ventricle has severely decreased function. The left ventricle demonstrates global hypokinesis. The left ventricular internal cavity size was moderately dilated. There is severe left ventricular hypertrophy of the inferior segment. Left ventricular diastolic function could not be evaluated due to mitral regurgitation (moderate or  greater). Left ventricular diastolic function could not be evaluated. Right Ventricle: The right ventricular size is mild to moderately. No increase in right ventricular wall thickness. Right ventricular systolic function mildly reduced with normal basal excursion. Tricuspid regurgitation signal is inadequate for assessing  PA pressure. Left Atrium: Left atrial size was mildly dilated. Right Atrium: Right atrial size was mildly dilated. Pericardium: A small pericardial effusion is present. The pericardial effusion is circumferential. Mitral Valve: MR mechanism appears ventricular functional in nature, with blunting of the right sided pulmonary vein Doppler. The mitral valve is normal in structure. Moderate to severe mitral valve regurgitation. Tricuspid Valve: The tricuspid valve is normal in structure. Tricuspid valve regurgitation is not demonstrated. No evidence of tricuspid stenosis. Aortic Valve: The aortic valve is tricuspid. Aortic valve regurgitation is not visualized. No aortic stenosis is present. Aortic valve mean gradient measures 4.0 mmHg. Aortic valve peak gradient measures 7.1 mmHg. Aortic valve area, by VTI measures 2.40 cm. Pulmonic Valve: The pulmonic valve was normal in structure. Pulmonic valve regurgitation is not visualized. No evidence of pulmonic stenosis. Aorta: The aortic root and ascending aorta are structurally normal, with no evidence of dilitation. Venous: The inferior vena cava is dilated in size with less than 50% respiratory variability, suggesting right atrial pressure of 15 mmHg. IAS/Shunts: No atrial level shunt detected by color flow Doppler.  LEFT VENTRICLE PLAX 2D LVIDd:         6.10 cm   Diastology LVIDs:         5.40 cm   LV e' medial:    6.20 cm/s LV PW:         1.50 cm   LV E/e' medial:  22.7 LV IVS:        1.10 cm   LV e' lateral:   10.10 cm/s LVOT diam:     2.20 cm   LV E/e' lateral: 14.0 LV SV:         54 LV SV Index:   27 LVOT Area:     3.80 cm  RIGHT VENTRICLE              IVC RV S prime:  15.30 cm/s  IVC diam: 2.80 cm TAPSE (M-mode): 1.6 cm LEFT ATRIUM             Index        RIGHT ATRIUM           Index LA diam:        4.30 cm 2.19 cm/m   RA Area:     20.20 cm LA Vol (A2C):   68.4 ml 34.85 ml/m  RA Volume:   62.30 ml  31.74 ml/m LA Vol (A4C):   73.3 ml 37.34 ml/m LA Biplane Vol: 70.8 ml 36.07 ml/m  AORTIC VALVE AV Area (Vmax):    2.88 cm AV Area (Vmean):   2.62 cm AV Area (VTI):     2.40 cm AV Vmax:           133.00 cm/s AV Vmean:          95.300 cm/s AV VTI:            0.223 m AV Peak Grad:      7.1 mmHg AV Mean Grad:      4.0 mmHg LVOT Vmax:         100.60 cm/s LVOT Vmean:        65.600 cm/s LVOT VTI:          0.141 m LVOT/AV VTI ratio: 0.63  AORTA Ao Root diam: 3.70 cm Ao Asc diam:  3.05 cm MV E velocity: 141.00 cm/s                             SHUNTS                             Systemic VTI:  0.14 m                             Systemic Diam: 2.20 cm Rudean Haskell MD Electronically signed by Rudean Haskell MD Signature Date/Time: 12/26/2022/4:27:48 PM    Final    MR BRAIN WO CONTRAST  Result Date: 12/26/2022 CLINICAL DATA:  Presented on 02/29 with left arm weakness. EXAM: MRI HEAD WITHOUT CONTRAST TECHNIQUE: Multiplanar, multiecho pulse sequences of the brain and surrounding structures were obtained without intravenous contrast. COMPARISON:  CT/CTA head and neck 12/25/2022 FINDINGS: Incomplete study with axial and coronal DWI, motion degraded axial T2 and FLAIR, and motion degraded axial GRE sequences obtained. Brain: There is moderate-to-large area of diffusion restriction in the right MCA distribution involving the posterior temporal lobe, insula, external capsule, and corona radiata consistent with acute infarct. There are also punctate acute infarcts in the high right parietal cortex. There is associated FLAIR signal abnormality and edema with sulcal effacement but no midline shift. There is no definite evidence of hemorrhage. Background  parenchymal volume is normal. The ventricles are normal in size. Parenchymal signal is otherwise normal. Vascular: The major flow voids are normal on the provided sequences. Skull and upper cervical spine: Suboptimally evaluated on the provided sequences. Sinuses/Orbits: The paranasal sinuses are clear. The globes and orbits are grossly unremarkable. Other: None. IMPRESSION: Moderate-to-large area of acute infarct in the right MCA distribution with associated edema and regional sulcal effacement but no midline shift or definite hemorrhage. Electronically Signed   By: Valetta Mole M.D.   On: 12/26/2022 10:11   CT ANGIO HEAD NECK W WO CM  Result Date: 12/25/2022  CLINICAL DATA:  Initial evaluation for neuro deficit, stroke. EXAM: CT ANGIOGRAPHY HEAD AND NECK TECHNIQUE: Multidetector CT imaging of the head and neck was performed using the standard protocol during bolus administration of intravenous contrast. Multiplanar CT image reconstructions and MIPs were obtained to evaluate the vascular anatomy. Carotid stenosis measurements (when applicable) are obtained utilizing NASCET criteria, using the distal internal carotid diameter as the denominator. RADIATION DOSE REDUCTION: This exam was performed according to the departmental dose-optimization program which includes automated exposure control, adjustment of the mA and/or kV according to patient size and/or use of iterative reconstruction technique. CONTRAST:  3m OMNIPAQUE IOHEXOL 350 MG/ML SOLN COMPARISON:  Prior CT from earlier the same day. FINDINGS: CTA NECK FINDINGS Aortic arch: Visualized aortic arch normal in caliber. Aberrant right subclavian artery noted. No stenosis about the origin of the great vessels. Right carotid system: Right common and internal carotid arteries are patent without dissection. Subtle transverse oriented Leigh shelf-like defect at the origin of the cervical right ICA, suspicious for a small carotid web (series 9, image 127). No  significant stenosis. Left carotid system: Left common and internal carotid arteries are patent without stenosis or dissection. Vertebral arteries: Both vertebral arteries arise from subclavian arteries. No proximal subclavian artery stenosis. Left vertebral artery slightly dominant. Vertebral arteries are patent without stenosis or dissection. Skeleton: No discrete or worrisome osseous lesions. Mild cervical spondylosis without significant spinal stenosis. Other neck: No other acute soft tissue abnormality within the neck. Upper chest: Mild scattered ground-glass density with interlobular septal thickening within the visualized lungs, likely mild pulmonary interstitial congestion/edema. Visualized upper chest demonstrates no other acute finding. Review of the MIP images confirms the above findings CTA HEAD FINDINGS Anterior circulation: Both internal carotid arteries are patent to the termini without stenosis or other abnormality. A1 segments patent bilaterally. Normal anterior communicating artery complex. Anterior cerebral arteries patent without stenosis. No M1 stenosis or occlusion. Distal left MCA branches widely patent and well perfused. On the right, there is an acute proximal stump right M3 occlusion, inferior division (series 7, image 134), in keeping with the evolving right MCA territory infarct. Remainder of the right MCA branches are patent and perfused. Posterior circulation: Minimal nonstenotic plaque noted within the dominant left V4 segment. Right V4 widely patent as well. Both PICA patent. Basilar widely patent without stenosis. Superior cerebellar arteries patent bilaterally. Both PCAs primarily supplied via the basilar. Short-segment mild proximal left P2 stenosis noted. PCAs otherwise widely patent to their distal aspects. Venous sinuses: Patent allowing for timing the contrast bolus. Anatomic variants: None significant.  No aneurysm. Review of the MIP images confirms the above findings  IMPRESSION: 1. Acute proximal stump right M3 occlusion, inferior division, in keeping with the evolving right MCA territory infarct. 2. Otherwise wide patency of the major arterial vasculature of the head and neck. No other emergent finding. 3. Subtle transversely oriented shelf-like defect at the origin of the cervical right ICA, suspicious for a small carotid web. No significant stenosis. 4. Aberrant right subclavian artery. 5. Mild scattered ground-glass density with interlobular septal thickening within the visualized lungs, likely mild pulmonary interstitial congestion/edema Electronically Signed   By: BJeannine BogaM.D.   On: 12/25/2022 20:59   CT HEAD WO CONTRAST  Result Date: 12/25/2022 CLINICAL DATA:  Neuro deficit, acute, stroke suspected EXAM: CT HEAD WITHOUT CONTRAST TECHNIQUE: Contiguous axial images were obtained from the base of the skull through the vertex without intravenous contrast. RADIATION DOSE REDUCTION: This exam was performed according to  the departmental dose-optimization program which includes automated exposure control, adjustment of the mA and/or kV according to patient size and/or use of iterative reconstruction technique. COMPARISON:  None Available. FINDINGS: Brain: Abnormal edema and loss of gray differentiation involving the posterior right insula as well as overlying frontal and parietal lobes, compatible with posterior right MCA territory infarct. Regional mass effect with slight leftward midline shift. No evidence of mass lesion or hydrocephalus. No mass occupying acute hemorrhage. Vascular: Probable hyperdense vessel within a right M2 MCA branch. Skull: No acute fracture. Sinuses/Orbits: Largely clear sinuses.  No acute orbital findings. Other: No mastoid effusions. IMPRESSION: 1. Acute or subacute right posterior MCA territory infarct. Regional mass effect with slight leftward midline shift. MRI could further characterize. 2. Probable hyperdense vessel within a right  M2 MCA branch, suspicious for thrombus. A CTA could further evaluate. Electronically Signed   By: Margaretha Sheffield M.D.   On: 12/25/2022 13:03    Labs:  Basic Metabolic Panel:    Latest Ref Rng & Units 01/09/2023    8:52 AM 01/08/2023    9:59 AM 01/08/2023    6:25 AM  BMP  Glucose 70 - 99 mg/dL 122  113  113   BUN 6 - 20 mg/dL 19  17  18    Creatinine 0.61 - 1.24 mg/dL 1.14  1.04  1.08   Sodium 135 - 145 mmol/L 137  138  137   Potassium 3.5 - 5.1 mmol/L 3.5  4.0  4.1   Chloride 98 - 111 mmol/L 103  108  103   CO2 22 - 32 mmol/L 22  22  21    Calcium 8.9 - 10.3 mg/dL 9.1  8.9  8.9      CBC:    Latest Ref Rng & Units 01/08/2023    6:25 AM 01/05/2023    2:14 PM 01/01/2023    7:56 AM  CBC  WBC 4.0 - 10.5 K/uL 11.5  6.4  8.9   Hemoglobin 13.0 - 17.0 g/dL 13.1  12.0  13.2   Hematocrit 39.0 - 52.0 % 38.0  36.3  38.0   Platelets 150 - 400 K/uL 288  334  340      CBG: No results for input(s): "GLUCAP" in the last 168 hours.  Brief HPI:   Jeremy Fritz is a 42 y.o. male ***   Hospital Course: GIANI BETZOLD was admitted to rehab 12/31/2022 for inpatient therapies to consist of PT  and OT at least three hours five days a week. Past admission physiatrist, therapy team and rehab RN have worked together to provide customized collaborative inpatient rehab.   Blood pressures were monitored on TID basis and has been stable. He was maintained on [place] with 1800 cc FR and weights have been stable but he started reporting cough with intermittent SOB.  He developed SOB on 03/14 am with CXR showing concerns of edema or multifocal PNA. He was treated with IV diuresis and cardiology recommended adding Entresto and aldactone to optimize treatment as he continued to have BP elevations. He has also been educated on importance of adhering to FR and is to follow up with cardiology for further titration of meds. He completed his 2 week course of PCN G and received dose of Bicillin on 03/16 prior to discharge.          Rehab course: During patient's stay in rehab weekly team conferences were held to monitor patient's progress, set goals and discuss barriers to discharge. At admission,  patient required CGA with mobility and supervision with ADL tasks.  Speech therapy evaluation revealed grossly intact cognitive linguistic skills and no follow up recommended.  He  has had improvement in activity tolerance, balance, postural control as well as ability to compensate for deficits. He has had improvement in functional use LUE  and LLE as well as improvement in awareness. He is able to complete ADL tasks independently. He is independent for transfers and is able to ambulate        Disposition: Home  Diet: heart healthy/Monitor carb intake.   Special Instructions: No driving or strenuous activity till cleared by MD  Discharge Instructions     Ambulatory referral to Neurology   Complete by: As directed    An appointment is requested in approximately: 4-6 weeks/Stroke follow up. Needs sleep study/CPAP   Ambulatory referral to Physical Medicine Rehab   Complete by: As directed    Hospital follow up      Allergies as of 01/09/2023   No Known Allergies      Medication List     STOP taking these medications    amLODipine 5 MG tablet Commonly known as: NORVASC   dapagliflozin propanediol 5 MG Tabs tablet Commonly known as: Farxiga Replaced by: Jardiance 10 MG Tabs tablet   naloxone 4 MG/0.1ML Liqd nasal spray kit Commonly known as: NARCAN   nicotine 21 mg/24hr patch Commonly known as: NICODERM CQ - dosed in mg/24 hours   penicillin G potassium 12 Million Units in dextrose 5 % 500 mL       TAKE these medications    acetaminophen 325 MG tablet Commonly known as: TYLENOL Take 1-2 tablets (325-650 mg total) by mouth every 4 (four) hours as needed for mild pain.   albuterol 108 (90 Base) MCG/ACT inhaler Commonly known as: VENTOLIN HFA Inhale 2 puffs into the lungs every 6 (six)  hours as needed for wheezing or shortness of breath.   Aspirin Low Dose 81 MG tablet Generic drug: aspirin EC Take 1 tablet (81 mg total) by mouth daily. Swallow whole.   atorvastatin 40 MG tablet Commonly known as: LIPITOR Take 1 tablet (40 mg total) by mouth daily.   carvedilol 12.5 MG tablet Commonly known as: COREG Take 1 tablet (12.5 mg total) by mouth 2 (two) times daily with a meal. What changed:  medication strength how much to take   clopidogrel 75 MG tablet Commonly known as: PLAVIX Take 1 tablet (75 mg total) by mouth daily.   Entresto 24-26 MG Generic drug: sacubitril-valsartan Take 1 tablet by mouth 2 (two) times daily.   Jardiance 10 MG Tabs tablet Generic drug: empagliflozin Take 1 tablet (10 mg total) by mouth daily. Replaces: dapagliflozin propanediol 5 MG Tabs tablet   nicotine polacrilex 2 MG gum Commonly known as: NICORETTE Take 1 each (2 mg total) by mouth as needed for smoking cessation. Notes to patient: Sign up with [facility] for supplies   spironolactone 25 MG tablet Commonly known as: ALDACTONE Take 0.5 tablets (12.5 mg total) by mouth daily.   traZODone 50 MG tablet Commonly known as: DESYREL Take 0.5 tablets (25 mg total) by mouth at bedtime as needed for sleep.        Follow-up Information     Izik Bingman, Victorino Sparrow, MD Follow up.   Specialty: Physical Medicine and Rehabilitation Why: office will call you with follow up appointment Contact information: [address] [place] [address] [phone]  [facility] Follow up.   Why: office will call you with follow up appointment Contact information: [address]     [address] [place] [address] [phone]        Kathlynn Grate, DO Follow up on 02/17/2023.   Specialties: Infectious Diseases, Internal Medicine Why: Appointment at 3:30 pm/be there at 3:10 for paperwork. Contact information: [address] [employer] [address] [place] [address] [phone]         [facility] Office Follow up on 01/26/2023.   Specialty: Cardiology Why: be there at 8:45 for 9 am appointment. Contact information: [address] [place] [address] [phone]        Rema Fendt, NP Follow up on 04/02/2023.   Specialty: Nurse Practitioner Why: Appointment at 4 pm. Contact information: [address] [place] [address] [phone]                 Signed: Jacquelynn Cree 01/09/2023, 4:33 PM

## 2023-01-06 NOTE — Progress Notes (Signed)
Occupational Therapy Session Note  Patient Details  Name: Jeremy Fritz MRN: [id number] Date of Birth: 08-04-1981  Today's Date: 01/06/2023 OT Individual Time: 0915-1000 OT Individual Time Calculation (min): 45 min    Short Term Goals: Week 1:  OT Short Term Goal 1 (Week 1): STGs = LTGs  Skilled Therapeutic Interventions/Progress Updates:    Pt received sitting on his bed ready for therapy. Pt ambulated to gym with S as he pushed his IV pole.  Pt seen this session to focus on L hand control. Pt worked in standing with grasping and releasing L hand as he picked up a laundry basket with B hands and moving it from floor level to table level. Pt then grasped bean bags and had to work on dynamic reach to place them in basket and practice picking up smaller basket with 2 hands.  Pt managed task well.   Standing at mirror, he used a 5 lb dowel bar for shoulder exercises focusing on symmetry of movement and core engagement.    Seated FMC with using L hand to pick up small blocks and beads and then stringing them onto plastic string.  He needed extra time with very small beads but over all did well with task. Pt ambulated back to his room, in room with all needs met.   Therapy Documentation Precautions:  Precautions Precautions: Fall Precaution Comments: L inattention Restrictions Weight Bearing Restrictions: No  Pain: Pain Assessment Pain Score: 0-No pain ADL: ADL Eating: Independent Grooming: Independent Upper Body Bathing: Supervision/safety Where Assessed-Upper Body Bathing: Shower Lower Body Bathing: Supervision/safety Where Assessed-Lower Body Bathing: Shower Upper Body Dressing: Independent Where Assessed-Upper Body Dressing: Edge of bed Lower Body Dressing: Supervision/safety Where Assessed-Lower Body Dressing: Edge of bed Toileting: Supervision/safety Where Assessed-Toileting: Glass blower/designer: Close supervision Armed forces technical officer Method: Pensions consultant: Close supervision Social research officer, government Method: Heritage manager: Grab bars, Shower seat with back  Therapy/Group: Individual Therapy  [facility] 01/06/2023, 12:57 PM

## 2023-01-06 NOTE — Progress Notes (Signed)
Physical Therapy Session Note  Patient Details  Name: Jeremy Fritz MRN: [id number] Date of Birth: [birth year]  Today's Date: 01/06/2023 PT Individual Time: 0805-0900 PT Individual Time Calculation (min): 55 min  PT Individual Time: [id number] PT Individual Time Calculation (min): 63 min  Short Term Goals: Week 1:  PT Short Term Goal 1 (Week 1): STGs = LTGs  Skilled Therapeutic Interventions/Progress Updates:   Session 1:  Pt recived sitting upright in recliner chair and agreeable to PT services. Pt performed ~156f of gait to therapy gym w/ close supervision and self managing IV pole. Pt continues to demonstrate an increased step length on the L cueing provided to avoid hitting IV pole.   In parallel bars pt performed dynamic balance task walking on long narrow airex pad 5x while holding tidal tank to challenge balance and incorporate UE NMR. Therapist providing superivision A and managing pt"s IV pole during task. Vc to advance slowly to allow time for finding pt's COM to prevent any LOB. Performed 10 minutes, on Biodex machine, 4 minutes on static and 6 minutes at level 8 w/ close supervision. At times throughout pt used bars for UE support. Emphasis on challenging balance and motor control . Pt showed an increased tendency to shift COM to the L and struggled to remain midline or shift to the R.   Pt ambulated back to room w/ close supervision and therapist managing IV pole. Sat at EOB and was left w/ all needs met.   Session 2:  Pt received sitting in recliner chair and agreeable to PT services. Pt navigated through hospital and outside where session took place. Pt navigated >10059fthrough different surfaces and at different levels of incline w/ close supervision and therapist navigating IV pole. Pt performed dual tasking during gait by dribbling a ball around different obstacles w/ RUE. Pt demonstrated functional control and dual tasking w/ no LOB to note.  Instructed to performed  crossover dribble from R to L UE which was moderately challenging for pt to control. Pt performed stepping task w/ close supervision by stepping in and out a hula hoop at just below knee level. Pt performed about 5x w/ L foot leading then 5x w/ stepping both feet in and out. Pt ambulated back to room w/ close supervision and returned to sit in recliner and was left w/ all needs met.   Therapy Documentation Precautions:  Precautions Precautions: Fall Precaution Comments: L inattention Restrictions Weight Bearing Restrictions: No General:  Pain: Pain Assessment Pain Scale: 0-10 Pain Score: 0-No pain       Therapy/Group: Individual Therapy  Layton Tappan 01/06/2023, 10:29 AM

## 2023-01-06 NOTE — Progress Notes (Signed)
PROGRESS NOTE   Subjective/Complaints:  THerapy hindered by IV pole  No new c/o today, some sensation to pinch returning to the RUE  ROS: Patient denies CP, SOB, N/V/D Objective:   No results found. Recent Labs    01/05/23 1414  WBC 6.4  HGB 12.0*  HCT 36.3*  PLT 334    Recent Labs    01/05/23 1414  NA 138  K 3.9  CL 105  CO2 21*  GLUCOSE 119*  BUN 16  CREATININE 1.07  CALCIUM 8.9     Intake/Output Summary (Last 24 hours) at 01/06/2023 1409 Last data filed at 01/06/2023 1300 Gross per 24 hour  Intake 473 ml  Output 1475 ml  Net -1002 ml         Physical Exam: Vital Signs Blood pressure 115/80, pulse 94, temperature 98.6 F (37 C), temperature source Oral, resp. rate 18, height 6' (1.829 m), weight 74.1 kg, SpO2 99 %.   General: No acute distress Mood and affect are appropriate Heart: Regular rate and rhythm no rubs murmurs or extra sounds Lungs: Clear to auscultation, breathing unlabored, no rales or wheezes Abdomen: Positive bowel sounds, soft nontender to palpation, nondistended Extremities: No clubbing, cyanosis, or edema Skin: No evidence of breakdown, no evidence of rash, LUE IV site CDI  Neurologic: Cranial nerves II through XII intact, motor strength is 5/5 in RIght  deltoid, bicep, tricep, grip. LUE 4/5 Delt, bic, tri, 3- finger flex/ext  Left UE absent LT sensation with decreased [place], able to touch finger to thumb slowly on the left side   Musculoskeletal: Full range of motion in all 4 extremities. No joint swelling   Assessment/Plan: 1. Functional deficits which require 3+ hours per day of interdisciplinary therapy in a comprehensive inpatient rehab setting. Physiatrist is providing close team supervision and 24 hour management of active medical problems listed below. Physiatrist and rehab team continue to assess barriers to discharge/monitor patient progress toward functional and  medical goals  Care Tool:  Bathing    Body parts bathed by patient: Right arm, Left arm, Chest, Abdomen, Front perineal area, Buttocks, Right upper leg, Left upper leg, Right lower leg, Left lower leg, Face         Bathing assist Assist Level: Supervision/Verbal cueing     Upper Body Dressing/Undressing Upper body dressing   What is the patient wearing?: Pull over shirt    Upper body assist Assist Level: Supervision/Verbal cueing    Lower Body Dressing/Undressing Lower body dressing      What is the patient wearing?: Underwear/pull up, Pants     Lower body assist Assist for lower body dressing: Supervision/Verbal cueing     Toileting Toileting    Toileting assist Assist for toileting: Supervision/Verbal cueing     Transfers Chair/bed transfer  Transfers assist     Chair/bed transfer assist level: Contact Guard/Touching assist     Locomotion Ambulation   Ambulation assist      Assist level: Contact Guard/Touching assist Assistive device: No Device Max distance: 150   Walk 10 feet activity   Assist     Assist level: Contact Guard/Touching assist Assistive device: No Device   Walk 50 feet  activity   Assist    Assist level: Contact Guard/Touching assist Assistive device: No Device    Walk 150 feet activity   Assist    Assist level: Contact Guard/Touching assist Assistive device: No Device    Walk 10 feet on uneven surface  activity   Assist     Assist level: Contact Guard/Touching assist     Wheelchair     Assist Is the patient using a wheelchair?: Yes Type of Wheelchair: Manual    Wheelchair assist level: Dependent - Patient 0% Max wheelchair distance: 150'    Wheelchair 50 feet with 2 turns activity    Assist        Assist Level: Dependent - Patient 0%   Wheelchair 150 feet activity     Assist      Assist Level: Dependent - Patient 0%   Blood pressure 115/80, pulse 94, temperature 98.6 F  (37 C), temperature source Oral, resp. rate 18, height 6' (1.829 m), weight 74.1 kg, SpO2 99 %.  Medical Problem List and Plan: 1. 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 Cocaine + tobacco abuse, has cardiomyopathy but TEE neg for clot or veg             -patient may shower             -ELOS/Goals: 5-7 days S             -Continue CIR therapies including PT, OT   2.  Antithrombotics: -DVT/anticoagulation:  Pharmaceutical: Lovenox             -antiplatelet therapy: DAPT X 3 months followed by ASA alone.  3. Pain Management: Tylenol prn.  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  4. Mood/Behavior/Sleep: LCSW to follow for evaluation and support.              -antipsychotic agents: N/A 5. Neuropsych/cognition: This patient is capable of making decisions on his own behalf. 6. Skin/Wound Care: Routine pressure relief measures.  7. Fluids/Electrolytes/Nutrition: Monitor I/O. Check CMET in am.  8. NICM EF-25-30%: Strict I/O- 1800 cc FR w/heart healthy diet. F/u cardiology --Monitor for signs of overload. Check wts daily.              --continue Losartan, Coreg, Lipitor, Jardiance.   9. HTN: Monitor BP TID--poorly controlled. OffNorvasc, Losartan, On Coreg to 12.'5mg'$  BID  Vitals:   01/06/23 0741 01/06/23 1325  BP: (!) 123/92 115/80  Pulse: 100 94  Resp: 17 18  Temp:  98.6 F (37 C)  SpO2: 100% 99%   3/12 controlled   10. 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 IM on 3/16, has ID f/u appt in April  11. OSA: Has not had CPAP for 6 months. Does not have PCP. 12. H/o cocaine/THC abuse:  Encourage cessation/healthy lifestyle. Neuropsych to counsel 13. Tachycardia: magnesium reviewed and normal. Increase Coreg to 12.'5mg'$  BID  14.  Probable COPD- alb neb qhs  LOS: 6 days A FACE TO FACE EVALUATION WAS PERFORMED  Charlett Blake 01/06/2023, 2:09 PM

## 2023-01-06 NOTE — Progress Notes (Signed)
Occupational Therapy Session Note  Patient Details  Name: [name] MRN: [id number] Date of Birth: 02/01/1981  Today's Date: 01/06/2023 OT Individual Time: [id number] OT Individual Time Calculation (min): 31 min    Short Term Goals: Week 1:  OT Short Term Goal 1 (Week 1): STGs = LTGs  Skilled Therapeutic Interventions/Progress Updates:  Pt greeted seated in recliner, pt agreeable to OT intervention. Pt completed functional ambulation to gym with pt rolling IV pole with supervision. Pt completed standing balance task with dual task component with pt also working on LUE [facility].   Pt able to stand at hi lo table on balance board with pt instructed to use small pegs to recreate peg structure from visual aid provided. Pt continues to present with impaired sensation in LUE needing to wear glove to allow for increased friction when manipulating small pegs. Pt needed MIN cues to maintain board in a level stance but able to maintain balance with CGA-supervision. Pt completed peg task with + time but overall supervision.   Pt ambulated back to room with pt rolling IV pole with supervision. Ended session with pt seated in recliner, all needs within reach.   Therapy Documentation Precautions:  Precautions Precautions: Fall Precaution Comments: L inattention Restrictions Weight Bearing Restrictions: No    Pain: No pain    Therapy/Group: Individual Therapy  [name] 01/06/2023, 5:36 PM

## 2023-01-07 ENCOUNTER — Inpatient Hospital Stay (HOSPITAL_COMMUNITY): Payer: Commercial Managed Care - HMO

## 2023-01-07 DIAGNOSIS — I63511 Cerebral infarction due to unspecified occlusion or stenosis of right middle cerebral artery: Secondary | ICD-10-CM | POA: Diagnosis not present

## 2023-01-07 MED ORDER — IPRATROPIUM BROMIDE 0.02 % IN SOLN
0.2500 mg | Freq: Four times a day (QID) | RESPIRATORY_TRACT | Status: DC
  Administered 2023-01-07 – 2023-01-08 (×4): 0.25 mg via RESPIRATORY_TRACT
  Filled 2023-01-07 (×8): qty 2.5

## 2023-01-07 NOTE — Progress Notes (Addendum)
PROGRESS NOTE   Subjective/Complaints:  Slept poorly due to cough, gets albuterol qhs  ROS: Patient denies CP, SOB, N/V/D Objective:   No results found. Recent Labs    01/05/23 1414  WBC 6.4  HGB 12.0*  HCT 36.3*  PLT 334     Recent Labs    01/05/23 1414  NA 138  K 3.9  CL 105  CO2 21*  GLUCOSE 119*  BUN 16  CREATININE 1.07  CALCIUM 8.9      Intake/Output Summary (Last 24 hours) at 01/07/2023 0836 Last data filed at 01/07/2023 0543 Gross per 24 hour  Intake 4726.01 ml  Output 1650 ml  Net 3076.01 ml         Physical Exam: Vital Signs Blood pressure (!) 131/92, pulse (!) 101, temperature 98.3 F (36.8 C), temperature source Oral, resp. rate 18, height 6' (1.829 m), weight 74.2 kg, SpO2 100 %.   General: No acute distress Mood and affect are appropriate Heart: Regular rate and rhythm no rubs murmurs or extra sounds Lungs: Clear to auscultation, breathing unlabored, no rales or wheezes Abdomen: Positive bowel sounds, soft nontender to palpation, nondistended Extremities: No clubbing, cyanosis, or edema Skin: No evidence of breakdown, no evidence of rash, RUE IV site CDI  Neurologic: Cranial nerves II through XII intact, motor strength is 5/5 in RIght  deltoid, bicep, tricep, grip. LUE 4/5 Delt, bic, tri, 3- finger flex/ext  Left UE absent LT sensation with decreased [place], able to touch finger to thumb slowly on the left side   Musculoskeletal: Full range of motion in all 4 extremities. No joint swelling   Assessment/Plan: 1. Functional deficits which require 3+ hours per day of interdisciplinary therapy in a comprehensive inpatient rehab setting. Physiatrist is providing close team supervision and 24 hour management of active medical problems listed below. Physiatrist and rehab team continue to assess barriers to discharge/monitor patient progress toward functional and medical goals  Care  Tool:  Bathing    Body parts bathed by patient: Right arm, Left arm, Chest, Abdomen, Front perineal area, Buttocks, Right upper leg, Left upper leg, Right lower leg, Left lower leg, Face         Bathing assist Assist Level: Supervision/Verbal cueing     Upper Body Dressing/Undressing Upper body dressing   What is the patient wearing?: Pull over shirt    Upper body assist Assist Level: Supervision/Verbal cueing    Lower Body Dressing/Undressing Lower body dressing      What is the patient wearing?: Underwear/pull up, Pants     Lower body assist Assist for lower body dressing: Supervision/Verbal cueing     Toileting Toileting    Toileting assist Assist for toileting: Supervision/Verbal cueing     Transfers Chair/bed transfer  Transfers assist     Chair/bed transfer assist level: Contact Guard/Touching assist     Locomotion Ambulation   Ambulation assist      Assist level: Contact Guard/Touching assist Assistive device: No Device Max distance: 150   Walk 10 feet activity   Assist     Assist level: Contact Guard/Touching assist Assistive device: No Device   Walk 50 feet activity   Assist  Assist level: Contact Guard/Touching assist Assistive device: No Device    Walk 150 feet activity   Assist    Assist level: Contact Guard/Touching assist Assistive device: No Device    Walk 10 feet on uneven surface  activity   Assist     Assist level: Contact Guard/Touching assist     Wheelchair     Assist Is the patient using a wheelchair?: Yes Type of Wheelchair: Manual    Wheelchair assist level: Dependent - Patient 0% Max wheelchair distance: 150'    Wheelchair 50 feet with 2 turns activity    Assist        Assist Level: Dependent - Patient 0%   Wheelchair 150 feet activity     Assist      Assist Level: Dependent - Patient 0%   Blood pressure (!) 131/92, pulse (!) 101, temperature 98.3 F (36.8 C),  temperature source Oral, resp. rate 18, height 6' (1.829 m), weight 74.2 kg, SpO2 100 %.  Medical Problem List and Plan: 1. 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 Cocaine + tobacco abuse, has cardiomyopathy but TEE neg for clot or veg             -patient may shower             -ELOS/Goals: 5-7 days S             -Continue CIR therapies including PT, OT   2.  Antithrombotics: -DVT/anticoagulation:  Pharmaceutical: Lovenox             -antiplatelet therapy: DAPT X 3 months followed by ASA alone.  3. Pain Management: Tylenol prn.  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  4. Mood/Behavior/Sleep: LCSW to follow for evaluation and support.              -antipsychotic agents: N/A 5. Neuropsych/cognition: This patient is capable of making decisions on his own behalf. 6. Skin/Wound Care: Routine pressure relief measures.  7. Fluids/Electrolytes/Nutrition: Monitor I/O. Check CMET in am.  8. NICM EF-25-30%: Strict I/O- 1800 cc FR w/heart healthy diet. F/u cardiology --Monitor for signs of overload. Check wts daily.              --continue Losartan, Coreg, Lipitor, Jardiance.   9. HTN: Monitor BP TID--poorly controlled. OffNorvasc, Losartan, On Coreg to 12.'5mg'$  BID  Vitals:   01/06/23 2152 01/07/23 0444  BP: 115/87 (!) 131/92  Pulse: 93 (!) 101  Resp: 18 18  Temp: 98.6 F (37 C) 98.3 F (36.8 C)  SpO2: 96% 100%   3/13 controlled   10. 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 IM on 3/16, has ID f/u appt in April  11. OSA: Has not had CPAP for 6 months. Does not have PCP. 12. H/o cocaine/THC abuse:  Encourage cessation/healthy lifestyle. Neuropsych to counsel 13. Tachycardia: magnesium reviewed and normal. Increase Coreg to 12.'5mg'$  BID  14.  Probable COPD- alb neb qhs may be interfering with sleep, has some secretions change to atrovent QID   LOS: 7 days A FACE TO FACE EVALUATION WAS PERFORMED  Charlett Blake 01/07/2023, 8:36 AM

## 2023-01-07 NOTE — Progress Notes (Addendum)
Occupational Therapy Session Note  Patient Details  Name: Jeremy Fritz MRN: [id number] Date of Birth: [birth year]  Today's Date: 01/07/2023 OT Individual Time: [id number] session 1  OT Individual Time Calculation (min): 41 min  Session 2: [id number]   Short Term Goals: Week 1:  OT Short Term Goal 1 (Week 1): STGs = LTGs  Skilled Therapeutic Interventions/Progress Updates:  Session 1: Pt greeted seated in recliner, pt agreeable to OT intervention. Session focused on IADLS and higher level balance challenges. Pt impacted by increased WOB today reporting feeling more SOB during session ( O2 96% and HR max 105 bpm) , however pt able to complete IADL task of collecting cloths from floor level in laundry basket and transport clothes basket to laundry room for simulated laundry task, pt completed task with overall supervision, using energy conservation strategies as needed.   Pt also completed dynamic balance task where pt able to ambulate ~ 50 ft while balancing cup of water on tray. Pts gait speed did decrease but able to complete task with no LOB or spillage. Pt completed additional balance task with pt walking while holding 2 cones and balancing 2 bean bags on each cone. Pt did had difficulty balancing bean bag on cone in L hand noted to drop bean bag 3x d/t impaired LUE FMC.   Ended session with pt seated in recliner with all needs within reach.   Session 2: pt greeted seated in recliner with visitor present. Pt agreeable to OT intervention. Pt completed ambulatory transfer to bathroom rolling IV pole with supervision. Pt really could be independent but with IV pole requires supervision.   Pt completed functional mobility to gym holding IV pole with supervision. Pt completed below NMR tasks to challenge core strength and provided NMR to LUE/LLE. Pt able to transition into quadraped onto mat table with supervision. Pt able to extend opposite leg/arm to challenge core strength  with close  supervision.pt completed 3 sets of 10 "bird dogs" with pt reporting that task requires a great amount of effort.   Pt completed sidelying therex to increase BLE strength with pt completing x10 clamshells to strength glutes to improve LB strength.   Education provided on floor transfers at home as well as fall prevention for home. Pt reports no falls at home but did provide education on preferred supine>standing method for safety in case a fall does occur. Pt able to demonstrate supine>sidelying> tall kneeling>one knee> standing using mat table to push up into full stand.   Pt ambulated back to room with IV pole with supervision, pt completed ambulatory toilet transfer with IV pole and supervision. Ended session with pt up in room on phone and NT present assisting pt as needed.   Therapy Documentation Precautions:  Precautions Precautions: Fall Precaution Comments: L inattention Restrictions Weight Bearing Restrictions: No   Pain: No pain during either session.   Therapy/Group: Individual Therapy  Corinne Ports [facility] 01/07/2023, 12:06 PM

## 2023-01-07 NOTE — Plan of Care (Signed)

## 2023-01-07 NOTE — Progress Notes (Signed)
Occupational Therapy Session Note  Patient Details  Name: Jeremy Fritz MRN: [id number] Date of Birth: 06-30-1981  Today's Date: 01/07/2023 OT Individual Time: [id number] OT Individual Time Calculation (min): 45 min    Short Term Goals: Week 1:  OT Short Term Goal 1 (Week 1): STGs = LTGs  Skilled Therapeutic Interventions/Progress Updates:    Pt received in bed stating he did not feel well as he has not slept well the night before but agreed a shower would help him feel better. RN disconnected IV so pt could shower. IV covered with plastic.  Pt ambulated around his room to gather his clothing and supplies.  He placed old towels in linen bag with A to guide L hand to hold bag and cue to shift his gaze to the left.   He got somewhat frustrated when bags were not opening correctly (they can be difficult to manage even for staff!).  Pt encouraged to sit and rest as MD came in for assessment.  Pt then able to continue session more relaxed and he ambulated back to shower carrying his clothing he plans to change into.  Pt able to get in and out of the shower and shower then dress independently.  If pt is disconnected to the IV he can move independently.  With the IV he needs some occasional supervision to avoid getting tangled in IV line.  After completing ADLs pt sat in recliner and worked on [employer] with using his foam block.  Pt in chair with all needs met.  Pt has met his self care goals.  Pt will continue to work on goals of being independent with light housekeeping and meal prep.    Therapy Documentation Precautions:  Precautions Precautions: Fall Precaution Comments: L inattention Restrictions Weight Bearing Restrictions: No Pain:  No c/o pain  ADL: ADL Eating: Independent Grooming: Independent Upper Body Bathing: Independent Where Assessed-Upper Body Bathing: Shower Lower Body Bathing: Independent Where Assessed-Lower Body Bathing: Shower Upper Body Dressing: Independent Where  Assessed-Upper Body Dressing: Edge of bed Lower Body Dressing: Independent Where Assessed-Lower Body Dressing: Edge of bed Toileting: Independent Where Assessed-Toileting: Glass blower/designer: Programmer, applications Method: Wellsite geologist Transfer: IT consultant Method: Heritage manager: Grab bars, Shower seat with back     Therapy/Group: Individual Therapy  [place] 01/07/2023, 1:27 PM

## 2023-01-07 NOTE — Progress Notes (Signed)
Patient ID: Jeremy Fritz, male   DOB: 1981-07-09, 41 y.o.   MRN: [id number]  Team Conference Report to Patient/Family  Team Conference discussion was reviewed with the patient and caregiver, including goals, any changes in plan of care and target discharge date.  Patient and caregiver express understanding and are in agreement.  The patient has a target discharge date of 01/10/23.  Sw met with patient and provided team conference updates. Patient aware about d/c on Saturday and the plan to complete IV on Friday and receive injection on Saturday before d/c. Patient reports mother is a Marine scientist and has no concerns about d/c home. No additional questions or concerns.   Dyanne Iha 01/07/2023, 1:14 PM

## 2023-01-07 NOTE — Progress Notes (Signed)
Physical Therapy Session Note  Patient Details  Name: Jeremy Fritz MRN: [id number] Date of Birth: [birth year]  Today's Date: 01/07/2023 PT Individual Time: [id number] PT Individual Time Calculation (min): 53 min   Short Term Goals: Week 1:  PT Short Term Goal 1 (Week 1): STGs = LTGs  Skilled Therapeutic Interventions/Progress Updates:    Pt received sitting at EOB and agreeable to PT services. Pt expressed that he was not feeling well and did not sleep well. Pt's MD aware of pt's symptoms. Pt performed ~257f of gait to main therapy gym w/ CGA. Decreased speed and increased forward trunk posture than previous sessions. Pt remained hooked up to IV pole w/ therapist managing throughout session.  Pt performed standing cone taps on foam pad. 2x10 taps w/ CGA. Pt cued to laterally wt shift, find COM then tap cone to demonstrated motor control and prevent LOB. Pt performed ~246fx2 of lateral steps w/ green t-band positioned above knees, while holding 2# bar w/ elbows extended. After first set, pt took an extended rest break due to fatigue. Therapist instructed pt in diaphragmatic breathing exercises to assist in breath control. Pt performed seated resisted hip flexion using green t-band x10 w/ vc to keep trunk upright to emphasize trunk control.   Pt ambulated back to room and performed ~2x1082ff toe walking and 2x10 ft of heel walking to further work on LE NMR and balance. Pt left seated in recliner chair, w/ all needs met.   Therapy Documentation Precautions:  Precautions Precautions: Fall Precaution Comments: L inattention Restrictions Weight Bearing Restrictions: No General:   Vital Signs: Therapy Vitals Temp: 98.8 F (37.1 C) Temp Source: Oral Pulse Rate: (!) 107 Resp: 18 BP: (!) 126/95 Patient Position (if appropriate): Sitting Oxygen Therapy SpO2: 100 % O2 Device: Room Air Pain: denies pain, but feels ill          Therapy/Group: Individual Therapy  Gillermo Poch 01/07/2023, 5:19 PM

## 2023-01-07 NOTE — Progress Notes (Signed)
Patient ID: Jeremy Fritz, male   DOB: Apr 26, 1981, 42 y.o.   MRN: [id number]  OP referral [facility]ed to [facility] P: [phone] F: [phone]

## 2023-01-07 NOTE — Plan of Care (Signed)
  Problem: RH Functional Use of Upper Extremity Goal: LTG Patient will use RT/LT upper extremity as a (OT) Description: LTG: Patient will use right/left upper extremity as a stabilizer/gross assist/diminished/nondominant/dominant level with assist, with/without cues during functional activity (OT) Outcome: Progressing   Problem: RH Light Housekeeping Goal: LTG Patient will perform light housekeeping w/assist (OT) Description: LTG: Patient will perform light housekeeping with assistance, with/without cues (OT). Flowsheets (Taken 01/07/2023 1331) LTG: Pt will perform light housekeeping with assistance level of: (LTG upgraded due to progress.) Independent Note: LTG upgraded due to progress.    Problem: RH Simple Meal Prep Goal: LTG Patient will perform simple meal prep w/assist (OT) Description: LTG: Patient will perform simple meal prep with assistance, with/without cues (OT). Flowsheets (Taken 01/07/2023 1331) LTG: Pt will perform simple meal prep with assistance level of: Independent LTG: Pt will perform simple meal prep w/level of: Ambulate without device

## 2023-01-08 ENCOUNTER — Inpatient Hospital Stay (HOSPITAL_COMMUNITY): Payer: Commercial Managed Care - HMO

## 2023-01-08 ENCOUNTER — Other Ambulatory Visit (HOSPITAL_COMMUNITY): Payer: Self-pay

## 2023-01-08 DIAGNOSIS — I63511 Cerebral infarction due to unspecified occlusion or stenosis of right middle cerebral artery: Secondary | ICD-10-CM | POA: Diagnosis not present

## 2023-01-08 LAB — CBC
HCT: 38 % — ABNORMAL LOW (ref 39.0–52.0)
Hemoglobin: 13.1 g/dL (ref 13.0–17.0)
MCH: 29.4 pg (ref 26.0–34.0)
MCHC: 34.5 g/dL (ref 30.0–36.0)
MCV: 85.4 fL (ref 80.0–100.0)
Platelets: 288 10*3/uL (ref 150–400)
RBC: 4.45 MIL/uL (ref 4.22–5.81)
RDW: 15.6 % — ABNORMAL HIGH (ref 11.5–15.5)
WBC: 11.5 10*3/uL — ABNORMAL HIGH (ref 4.0–10.5)
nRBC: 0 % (ref 0.0–0.2)

## 2023-01-08 LAB — BASIC METABOLIC PANEL
Anion gap: 13 (ref 5–15)
Anion gap: 8 (ref 5–15)
BUN: 18 mg/dL (ref 6–20)
BUN: 17 mg/dL (ref 6–20)
CO2: 21 mmol/L — ABNORMAL LOW (ref 22–32)
CO2: 22 mmol/L (ref 22–32)
Calcium: 8.9 mg/dL (ref 8.9–10.3)
Calcium: 8.9 mg/dL (ref 8.9–10.3)
Chloride: 103 mmol/L (ref 98–111)
Chloride: 108 mmol/L (ref 98–111)
Creatinine, Ser: 1.08 mg/dL (ref 0.61–1.24)
Creatinine, Ser: 1.04 mg/dL (ref 0.61–1.24)
GFR, Estimated: >60 mL/min (ref >60)
GFR, Estimated: >60 mL/min (ref >60)
Glucose, Bld: 113 mg/dL — ABNORMAL HIGH (ref 70–99)
Glucose, Bld: 113 mg/dL — ABNORMAL HIGH (ref 70–99)
Potassium: 4.1 mmol/L (ref 3.5–5.1)
Potassium: 4 mmol/L (ref 3.5–5.1)
Sodium: 137 mmol/L (ref 135–145)
Sodium: 138 mmol/L (ref 135–145)

## 2023-01-08 MED ORDER — TRAZODONE HCL 50 MG PO TABS
25.0000 mg | ORAL_TABLET | Freq: Every evening | ORAL | 0 refills | Status: DC | PRN
  Filled 2023-01-08: qty 15, 30d supply, fill #0

## 2023-01-08 MED ORDER — FUROSEMIDE 10 MG/ML IJ SOLN
40.0000 mg | Freq: Once | INTRAMUSCULAR | Status: AC
  Administered 2023-01-08: 40 mg via INTRAVENOUS
  Filled 2023-01-08: qty 4

## 2023-01-08 MED ORDER — ATORVASTATIN CALCIUM 40 MG PO TABS
40.0000 mg | ORAL_TABLET | Freq: Every day | ORAL | 0 refills | Status: DC
  Filled 2023-01-08: qty 30, 30d supply, fill #0

## 2023-01-08 MED ORDER — ACETAMINOPHEN 325 MG PO TABS: 325.0000 mg | ORAL_TABLET | ORAL | Status: DC | PRN

## 2023-01-08 MED ORDER — SACUBITRIL-VALSARTAN 24-26 MG PO TABS
1.0000 | ORAL_TABLET | Freq: Two times a day (BID) | ORAL | 0 refills | Status: DC
  Filled 2023-01-08: qty 60, 30d supply, fill #0

## 2023-01-08 MED ORDER — SPIRONOLACTONE 25 MG PO TABS
12.5000 mg | ORAL_TABLET | Freq: Every day | ORAL | 0 refills | Status: DC
  Filled 2023-01-08: qty 15, 30d supply, fill #0

## 2023-01-08 MED ORDER — SPIRONOLACTONE 12.5 MG HALF TABLET
12.5000 mg | ORAL_TABLET | Freq: Every day | ORAL | Status: DC
  Administered 2023-01-09 – 2023-01-10 (×2): 12.5 mg via ORAL
  Filled 2023-01-08 (×2): qty 1

## 2023-01-08 MED ORDER — CLOPIDOGREL BISULFATE 75 MG PO TABS
75.0000 mg | ORAL_TABLET | Freq: Every day | ORAL | 0 refills | Status: DC
  Filled 2023-01-08: qty 30, 30d supply, fill #0

## 2023-01-08 MED ORDER — CARVEDILOL 12.5 MG PO TABS
12.5000 mg | ORAL_TABLET | Freq: Two times a day (BID) | ORAL | 0 refills | Status: DC
  Filled 2023-01-08: qty 60, 30d supply, fill #0

## 2023-01-08 MED ORDER — EMPAGLIFLOZIN 10 MG PO TABS
10.0000 mg | ORAL_TABLET | Freq: Every day | ORAL | 0 refills | Status: DC
  Filled 2023-01-08: qty 30, 30d supply, fill #0

## 2023-01-08 MED ORDER — NICOTINE 21 MG/24HR TD PT24
21.0000 mg | MEDICATED_PATCH | Freq: Every day | TRANSDERMAL | 0 refills | Status: DC
  Filled 2023-01-08: qty 28, 28d supply, fill #0

## 2023-01-08 MED ORDER — ASPIRIN 81 MG PO TBEC
81.0000 mg | DELAYED_RELEASE_TABLET | Freq: Every day | ORAL | 0 refills | Status: DC
  Filled 2023-01-08: qty 30, 30d supply, fill #0

## 2023-01-08 MED ORDER — IPRATROPIUM-ALBUTEROL 0.5-2.5 (3) MG/3ML IN SOLN: 3.0000 mL | RESPIRATORY_TRACT | Status: DC | PRN

## 2023-01-08 MED ORDER — SACUBITRIL-VALSARTAN 24-26 MG PO TABS
1.0000 | ORAL_TABLET | Freq: Two times a day (BID) | ORAL | Status: DC
  Administered 2023-01-08 – 2023-01-10 (×5): 1 via ORAL
  Filled 2023-01-08 (×5): qty 1

## 2023-01-08 NOTE — Progress Notes (Signed)
Physical Therapy Session Note  Patient Details  Name: Jeremy Fritz MRN: [id number] Date of Birth: 26-Mar-1981  Today's Date: 01/08/2023 PT Individual Time: 0757-      Short Term Goals: Week 1:  PT Short Term Goal 1 (Week 1): STGs = LTGs  Skilled Therapeutic Interventions/Progress Updates:    Chart reviewed. Pt received semi-reclined in bed. PT requested pt to participate, but pt declined. Pt cited high fatigue 2/2 lack of sleep, problems with BP, and excessive coughing and SOB with activity. Pt received chest Xray just prior to session with results pending. Pt noted to display SOB during conversation with PT. PT educated pt on role of pt and encouraged participation, but pt politely declined again. PT confirmed pt knowledge of need to make up time, with repeated offer to start session. Pt confirmed understanding and again politely declined. At end of session, pt was left semi-reclined in bed with alarm engaged, nurse call bell and all needs in reach.     Therapy Documentation Precautions:  Precautions Precautions: Fall Precaution Comments: L inattention Restrictions Weight Bearing Restrictions: No General: PT Amount of Missed Time (min): 45 Minutes PT Missed Treatment Reason: Patient ill (Comment) (pt c/o problems with BP along with strong cough and SOB, which was displayed during conversation)   Therapy/Group: Individual Therapy  Marquette Old, PT, DPT 01/08/2023, 8:09 AM

## 2023-01-08 NOTE — Group Note (Signed)
Patient Details Name: Jeremy Fritz MRN: [id number] DOB: [birth year] Today's Date: 01/08/2023  Time Calculation: OT Group Time Calculation OT Group Start Time: [id number] OT Group Stop Time: 1532 OT Group Time Calculation (min): 60 min   Group Description: Kitchen/ home making/ fall prevention: Pt participated in group session with a focus on kitchen tasks/mobility, energy conservation strategies ( ECS) and safe kitchen set- up for DC. Discussed safe recommendations for navigating kitchen with no AD. Discussed energy conservation strategies in relation to kitchen tasks such as rearranging frequently used kitchen items and eliminating potential fall risks in the kitchen. Pt able to ambulate in kitchen with no AD with supervision to engage in IADL task of making dog treats with an emphasis on standing tolerance, dynamic reaching, [name], bimanual tasks and increasing overall activity tolerance. Pt was more fatigued today and requested rest break appropriately after standing for > 10 mins to complete task.   Individual level documentation: Patient participated with moderate collaboration during session.   Pain:  No pain      [name] 01/08/2023, 4:15 PM

## 2023-01-08 NOTE — Progress Notes (Signed)
Occupational Therapy Note  Patient Details  Name: JANIEL RUNKEL MRN: [id number] Date of Birth: 1981/09/30  Today's Date: 01/08/2023 OT Missed Time: 61 Minutes Missed Time Reason: Patient ill (comment);Other (comment) (increased WOB, SOB and increased urination from diuretics)  Pt greeted seated EOB reporting long night from deficits listed above, pt politely declined session. Will continue efforts to make up for missed minutes.    Corinne Ports [facility] 01/08/2023, 12:19 PM

## 2023-01-08 NOTE — Progress Notes (Signed)
Received a call from charge nurse repotting patient is complaining of shortness of breath. He is hypertensive and has tachycardia. Also states patient has wheezing, he was instructed to check manual blood pressure and obtain an apical pulse. Dr Letta Pate note was reviewed. Awaiting a return call.

## 2023-01-08 NOTE — Progress Notes (Signed)
Physical Therapy Session Note  Patient Details  Name: Jeremy Fritz MRN: [id number] Date of Birth: 07/11/1981  Today's Date: 01/08/2023 PT Missed Time: 75 Minutes Missed Time Reason: Patient ill (Comment)  Short Term Goals: Week 1:  PT Short Term Goal 1 (Week 1): STGs = LTGs  Skilled Therapeutic Interventions/Progress Updates:     Pt requests to defer therapy at this time due to fatigue and malaise, saying that he is short of breath due to fluid overload and anxious about adjusting intake of fluid. PT will follow up as able.   Therapy Documentation Precautions:  Precautions Precautions: Fall Precaution Comments: L inattention Restrictions Weight Bearing Restrictions: No   Therapy/Group: Individual Therapy  Breck Coons, PT, DPT 01/08/2023, 5:28 PM

## 2023-01-08 NOTE — Progress Notes (Signed)
Called in to see patient. VS taken (Yellow mews). Patient having shortness of breath, with bilateral wheezes on auscultation.  Cough wet with blood tinge. Patient reassured. Provider contacted and others received. Atrovent inhaler given with good effect. CXR portable and laboratory request sent in and done. Awaiting lab results. Called provider with findings on CXR. Patient remain coherent and responses appropriate. Denies pain. Mother remained on phone with patient possibly due to anxiety. Mother reassured. Provider to follow up with patient regarding OSA history and any possible intervention, as patient has been having fragmented sleep. Safety ensured at all times.

## 2023-01-08 NOTE — Progress Notes (Signed)
PROGRESS NOTE   Subjective/Complaints:  Increasing BP, SOB at night with cough , no levg swelling no fevers/chills  ROS: Patient denies CP, SOB, N/V/D Objective:   DG CHEST PORT 1 VIEW  Result Date: 01/08/2023 CLINICAL DATA:  Shortness of breath. EXAM: PORTABLE CHEST 1 VIEW COMPARISON:  None Available. FINDINGS: Hazy bilateral airspace disease. A few Kerley lines are present. Borderline heart size for technique. No effusion or pneumothorax. IMPRESSION: Bilateral airspace disease which could be edema or multifocal pneumonia. Electronically Signed   By: Jorje Guild M.D.   On: 01/08/2023 06:45   DG Knee 1-2 Views Left  Result Date: 01/07/2023 CLINICAL DATA:  Left knee pain EXAM: LEFT KNEE - 1-2 VIEW COMPARISON:  None Available. FINDINGS: There is no evidence of acute fracture. There is mild medial compartment degenerative change. There is a small joint effusion. There is mild soft tissue swelling along the knee. IMPRESSION: No acute osseous abnormality. Small joint effusion and mild soft tissue swelling along the knee. Mild medial compartment osteoarthritis. Electronically Signed   By: Maurine Simmering M.D.   On: 01/07/2023 16:05   Recent Labs    01/05/23 1414 01/08/23 0625  WBC 6.4 11.5*  HGB 12.0* 13.1  HCT 36.3* 38.0*  PLT 334 288     Recent Labs    01/05/23 1414 01/08/23 0625  NA 138 137  K 3.9 4.1  CL 105 103  CO2 21* 21*  GLUCOSE 119* 113*  BUN 16 18  CREATININE 1.07 1.08  CALCIUM 8.9 8.9      Intake/Output Summary (Last 24 hours) at 01/08/2023 0827 Last data filed at 01/08/2023 0000 Gross per 24 hour  Intake 820 ml  Output 1150 ml  Net -330 ml         Physical Exam: Vital Signs Blood pressure (!) 143/105, pulse (!) 110, temperature 98.3 F (36.8 C), temperature source Oral, resp. rate 17, height 6' (1.829 m), weight 77 kg, SpO2 99 %.   General: No acute distress Mood and affect are  appropriate Heart: Regular rate and rhythm no rubs murmurs or extra sounds Lungs: Clear to auscultation, breathing unlabored, no rales or wheezes Abdomen: Positive bowel sounds, soft nontender to palpation, nondistended Extremities: No clubbing, cyanosis, or edema Skin: No evidence of breakdown, no evidence of rash, RUE IV site CDI  Neurologic: Cranial nerves II through XII intact, motor strength is 5/5 in RIght  deltoid, bicep, tricep, grip. LUE 4/5 Delt, bic, tri, 3- finger flex/ext  Left UE absent LT sensation with decreased [place], able to touch finger to thumb slowly on the left side   Musculoskeletal: Full range of motion in all 4 extremities. No joint swelling   Assessment/Plan: 1. Functional deficits which require 3+ hours per day of interdisciplinary therapy in a comprehensive inpatient rehab setting. Physiatrist is providing close team supervision and 24 hour management of active medical problems listed below. Physiatrist and rehab team continue to assess barriers to discharge/monitor patient progress toward functional and medical goals  Care Tool:  Bathing    Body parts bathed by patient: Right arm, Left arm, Chest, Abdomen, Front perineal area, Buttocks, Right upper leg, Left upper leg, Right lower leg, Left  lower leg, Face         Bathing assist Assist Level: Supervision/Verbal cueing     Upper Body Dressing/Undressing Upper body dressing   What is the patient wearing?: Pull over shirt    Upper body assist Assist Level: Supervision/Verbal cueing    Lower Body Dressing/Undressing Lower body dressing      What is the patient wearing?: Underwear/pull up, Pants     Lower body assist Assist for lower body dressing: Supervision/Verbal cueing     Toileting Toileting    Toileting assist Assist for toileting: Supervision/Verbal cueing     Transfers Chair/bed transfer  Transfers assist     Chair/bed transfer assist level: Contact Guard/Touching assist      Locomotion Ambulation   Ambulation assist      Assist level: Contact Guard/Touching assist Assistive device: No Device Max distance: 150   Walk 10 feet activity   Assist     Assist level: Contact Guard/Touching assist Assistive device: No Device   Walk 50 feet activity   Assist    Assist level: Contact Guard/Touching assist Assistive device: No Device    Walk 150 feet activity   Assist    Assist level: Contact Guard/Touching assist Assistive device: No Device    Walk 10 feet on uneven surface  activity   Assist     Assist level: Contact Guard/Touching assist     Wheelchair     Assist Is the patient using a wheelchair?: Yes Type of Wheelchair: Manual    Wheelchair assist level: Dependent - Patient 0% Max wheelchair distance: 150'    Wheelchair 50 feet with 2 turns activity    Assist        Assist Level: Dependent - Patient 0%   Wheelchair 150 feet activity     Assist      Assist Level: Dependent - Patient 0%   Blood pressure (!) 143/105, pulse (!) 110, temperature 98.3 F (36.8 C), temperature source Oral, resp. rate 17, height 6' (1.829 m), weight 77 kg, SpO2 99 %.  Medical Problem List and Plan: 1. 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 Cocaine + tobacco abuse, has cardiomyopathy but TEE neg for clot or veg             -patient may shower             -ELOS/Goals: 5-7 days S             -Continue CIR therapies including PT, OT   2.  Antithrombotics: -DVT/anticoagulation:  Pharmaceutical: Lovenox             -antiplatelet therapy: DAPT X 3 months followed by ASA alone.  3. Pain Management: Tylenol prn.  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  4. Mood/Behavior/Sleep: LCSW to follow for evaluation and support.              -antipsychotic agents: N/A 5. Neuropsych/cognition: This patient is capable of making decisions on his own behalf. 6. Skin/Wound Care: Routine  pressure relief measures.  7. Fluids/Electrolytes/Nutrition: Monitor I/O. Check CMET in am.  8. NICM EF-25-30%: Strict I/O- 1800 cc FR w/heart healthy diet. F/u cardiology will ask them to review meds prior to discharge,  --Monitor for signs of overload. Check wts daily.              --continue Losartan, Coreg, Lipitor, Jardiance.   9. HTN: Monitor BP TID--poorly controlled. OffNorvasc, Losartan, had been on Entresto but this was  stopped will resume On Coreg to 12.'5mg'$  BID  Vitals:   01/08/23 0622 01/08/23 0718  BP: (!) 130/98 (!) 143/105  Pulse: (!) 106 (!) 110  Resp: 16 17  Temp:  98.3 F (36.8 C)  SpO2: 99% 99%   3/14 creeping up suspect IVF will give IV Lasix x 1  10. 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 IM on 3/16, has ID f/u appt in April  11. OSA: Has not had CPAP for 6 months. Does not have PCP. 12. H/o cocaine/THC abuse:  Encourage cessation/healthy lifestyle. Neuropsych to counsel 13. Tachycardia: magnesium reviewed and normal. Increase Coreg to 12.'5mg'$  BID, may need further adjustment  14.  Probable COPD- alb neb qhs may be interfering with sleep, has some secretions change to atrovent QID   LOS: 8 days A FACE TO FACE EVALUATION WAS PERFORMED  Charlett Blake 01/08/2023, 8:27 AM

## 2023-01-08 NOTE — Progress Notes (Signed)
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, corporate- retired)  Patient response [id number] Literacy - How often do you need to have someone help you when you read instructions, pamphlets, or other written material from your doctor or pharmacy?: Never  Patient response [id number] Isolation - How often do you feel lonely or isolated from those around you?: Never  Services provided included: RD, MD, OT, PT, SLP, RN, CM, TR, Pharmacy, Neuropsych, SW  Financial Services:       Choices offered to/list presented to: patient  Follow-up services arranged:  Outpatient, DME    Outpatient Servicies: [facility] PT OT DME : N/A    Patient response to transportation need: Is the patient able to respond to transportation needs?: Yes In the past 12 months, has lack of transportation kept you from medical appointments or from getting medications?: No In the past 12 months, has lack of transportation kept you from meetings, work, or from getting things needed for daily living?: No    Comments (or additional information):  Patient/Family verbalized understanding of follow-up arrangements:  Yes  Individual responsible for coordination of the follow-up plan: self  Confirmed correct DME delivered: Dyanne Iha 01/08/2023    Dyanne Iha

## 2023-01-08 NOTE — Progress Notes (Signed)
Progress Note  Patient Name: Jeremy Fritz Date of Encounter: 01/08/2023  Primary Cardiologist: Shelva Majestic, MD   Subjective   Patient seen and examined at his bedside he tells me that he has been noticing some shortness of breath over the last couple days.  Inpatient Medications    Scheduled Meds:  aspirin EC  81 mg Oral Daily   atorvastatin  40 mg Oral Daily   carvedilol  12.5 mg Oral BID WC   clopidogrel  75 mg Oral Daily   empagliflozin  10 mg Oral Daily   enoxaparin (LOVENOX) injection  40 mg Subcutaneous Q24H   ipratropium  0.25 mg Nebulization QID   nicotine  21 mg Transdermal Daily   [START ON 01/10/2023] penicillin g benzathine (BICILLIN-LA) IM  2.4 Million Units Intramuscular Once   sacubitril-valsartan  1 tablet Oral BID   Continuous Infusions:  penicillin G potassium 12 Million Units in dextrose 5 % 500 mL CONTINUOUS infusion 12 Million Units (01/08/23 0204)   PRN Meds: acetaminophen, alum & mag hydroxide-simeth, bisacodyl, diphenhydrAMINE, guaiFENesin-dextromethorphan, polyethylene glycol, prochlorperazine **OR** prochlorperazine **OR** prochlorperazine, sodium phosphate, traZODone   Vital Signs    Vitals:   01/08/23 0622 01/08/23 0718 01/08/23 1146 01/08/23 1212  BP: (!) 130/98 (!) 143/105  (!) 114/101  Pulse: (!) 106 (!) 110  97  Resp: 16 17  (!) 25  Temp:  98.3 F (36.8 C)  98.6 F (37 C)  TempSrc:  Oral  Oral  SpO2: 99% 99% 98% 91%  Weight:      Height:        Intake/Output Summary (Last 24 hours) at 01/08/2023 1306 Last data filed at 01/08/2023 1056 Gross per 24 hour  Intake 700 ml  Output 2600 ml  Net -1900 ml   Filed Weights   01/06/23 0500 01/07/23 0500 01/08/23 0500  Weight: 74.1 kg 74.2 kg 77 kg    Telemetry     - Personally Reviewed  ECG     - Personally Reviewed  Physical Exam    General: Comfortable, lying in the bed Head: Atraumatic, normal size  Eyes: PEERLA, EOMI  Neck: Supple, normal JVD Cardiac: Normal S1, S2;  RRR; no murmurs, rubs, or gallops Lungs: Mild bibasilar bilaterally Abd: Soft, nontender, no hepatomegaly  Ext: warm, no edema Musculoskeletal: No deformities, BUE and BLE strength normal and equal Skin: Warm and dry, no rashes   Neuro: Alert and oriented to person, place, time, and situation, CNII-XII grossly intact, no focal deficits  Psych: Normal mood and affect   Labs    Chemistry Recent Labs  Lab 01/05/23 1414 01/08/23 0625 01/08/23 0959  NA 138 137 138  K 3.9 4.1 4.0  CL 105 103 108  CO2 21* 21* 22  GLUCOSE 119* 113* 113*  BUN '16 18 17  '$ CREATININE 1.07 1.08 1.04  CALCIUM 8.9 8.9 8.9  GFRNONAA >60 >60 >60  ANIONGAP '12 13 8     '$ Hematology Recent Labs  Lab 01/05/23 1414 01/08/23 0625  WBC 6.4 11.5*  RBC 4.14* 4.45  HGB 12.0* 13.1  HCT 36.3* 38.0*  MCV 87.7 85.4  MCH 29.0 29.4  MCHC 33.1 34.5  RDW 15.4 15.6*  PLT 334 288    Cardiac EnzymesNo results for input(s): "TROPONINI" in the last 168 hours. No results for input(s): "TROPIPOC" in the last 168 hours.   BNPNo results for input(s): "BNP", "PROBNP" in the last 168 hours.   DDimer No results for input(s): "DDIMER" in the last 168 hours.  Radiology    DG CHEST PORT 1 VIEW  Result Date: 01/08/2023 CLINICAL DATA:  Shortness of breath. EXAM: PORTABLE CHEST 1 VIEW COMPARISON:  None Available. FINDINGS: Hazy bilateral airspace disease. A few Kerley lines are present. Borderline heart size for technique. No effusion or pneumothorax. IMPRESSION: Bilateral airspace disease which could be edema or multifocal pneumonia. Electronically Signed   By: Jorje Guild M.D.   On: 01/08/2023 06:45   DG Knee 1-2 Views Left  Result Date: 01/07/2023 CLINICAL DATA:  Left knee pain EXAM: LEFT KNEE - 1-2 VIEW COMPARISON:  None Available. FINDINGS: There is no evidence of acute fracture. There is mild medial compartment degenerative change. There is a small joint effusion. There is mild soft tissue swelling along the knee.  IMPRESSION: No acute osseous abnormality. Small joint effusion and mild soft tissue swelling along the knee. Mild medial compartment osteoarthritis. Electronically Signed   By: Maurine Simmering M.D.   On: 01/07/2023 16:05    Cardiac Studies   TTE 12/26/2022 IMPRESSIONS   1. Left ventricular ejection fraction, by estimation, is 25 to 30%. The left ventricle has severely decreased function. The left ventricle demonstrates global hypokinesis. The left ventricular internal cavity size  was moderately dilated. There is severe  left ventricular hypertrophy of the inferior segment. Left ventricular  diastolic function could not be evaluated. There is no LV thrombus visualized.   2. Right ventricular systolic function mildly reduced with normal basal excursion. The right ventricular size is mild to moderately. Tricuspid regurgitation signal is inadequate for assessing PA pressure.    3. Left atrial size was mildly dilated.   4. Right atrial size was mildly dilated.   5. A small pericardial effusion is present. The pericardial effusion is  circumferential.   6. MR mechanism appears ventricular functional in nature, with blunting  of the right sided pulmonary vein Doppler. The mitral valve is normal in  structure. Moderate to severe mitral valve regurgitation.   7. The aortic valve is tricuspid. Aortic valve regurgitation is not  visualized. No aortic stenosis is present.   8. The inferior vena cava is dilated in size with <50% respiratory  variability, suggesting right atrial pressure of 15 mmHg.   FINDINGS   Left Ventricle: Left ventricular ejection fraction, by estimation, is 25 to 30%. The left ventricle has severely decreased function. The left ventricle demonstrates global hypokinesis. The left ventricular internal  cavity size was moderately dilated.   There is severe left ventricular hypertrophy of the inferior segment. Left ventricular diastolic function could not be evaluated due to mitral  regurgitation (moderate or greater). Left ventricular diastolic function could not be evaluated.   Right Ventricle: The right ventricular size is mild to moderately. No increase in right ventricular wall thickness. Right ventricular systolic function mildly reduced with normal basal excursion. Tricuspid  regurgitation signal is inadequate for assessing PA pressure.   Left Atrium: Left atrial size was mildly dilated.   Right Atrium: Right atrial size was mildly dilated.   Pericardium: A small pericardial effusion is present. The pericardial effusion is circumferential.   Mitral Valve: MR mechanism appears ventricular functional in nature, with blunting of the right sided pulmonary vein Doppler. The mitral valve is normal in structure. Moderate to severe mitral valve regurgitation.   Tricuspid Valve: The tricuspid valve is normal in structure. Tricuspid valve regurgitation is not demonstrated. No evidence of tricuspid stenosis.   Aortic Valve: The aortic valve is tricuspid. Aortic valve regurgitation is not visualized. No aortic stenosis  is present. Aortic valve mean gradient measures 4.0 mmHg. Aortic valve peak gradient measures 7.1 mmHg. Aortic valve area, by VTI measures 2.40 cm.   Pulmonic Valve: The pulmonic valve was normal in structure. Pulmonic valve regurgitation is not visualized. No evidence of pulmonic stenosis.   Aorta: The aortic root and ascending aorta are structurally normal, with no evidence of dilitation.   Venous: The inferior vena cava is dilated in size with less than 50% respiratory variability, suggesting right atrial pressure of 15 mmHg.   IAS/Shunts: No atrial level shunt detected by color flow Doppler.    Patient Profile     42 y.o. male polysubstance abuse and acute ischemic right MCA stroke and newly diagnosed severe left ventricular systolic function EF 20 to 25%.   Assessment & Plan    Acute ischemic right MCA stroke ([place]) Cocaine abuse ([place]) Severely  depressed ejection fraction EF 25 to 30%. Moderate to severe mitral regurgitation Obstructive sleep apnea. Positive RVR suspected neurosyphilis  Clinically he does not appear to be fluid overloaded but with the mild bibasilar crackles I like to give the patient one-time dose of Lasix 40 mg IV.  He is hypertensive currently on carvedilol 12.5 mg twice daily, Entresto 24-26 mg twice daily, add Aldactone 12.5 mg daily.  I have added Aldactone 12.5 mg daily, which will help to optimize his guideline directed medical therapy as well as his blood pressure.  COPD labs per primary team.  Completing IV penicillin for his neurosyphilis.  Deferred to primary team for other regimen for this.    For questions or updates, please contact [place] Please consult [email or web address] for contact info under Cardiology/STEMI.      Signed, Berniece Salines, DO  01/08/2023, 1:06 PM

## 2023-01-08 NOTE — Patient Care Conference (Signed)
Inpatient RehabilitationTeam Conference and Plan of Care Update Date: 01/07/2023   Time: 10:08 AM    Patient Name: Jeremy Fritz      Medical Record Number: [id number]  Date of Birth: August 27, 1981 Sex: Male         Room/Bed: 4W07C/4W07C-01 Payor Info: Payor: [employer] / Plan: [employer] [place] HMO CONNECT / Product Type: *No Product type* /    Admit Date/Time:  12/31/2022  4:04 PM  Primary Diagnosis:  Acute ischemic right MCA stroke [facility])  Hospital Problems: Principal Problem:   Acute ischemic right MCA stroke ([place]) Active Problems:   HTN (hypertension)   Neurosyphilis in adult   Polysubstance abuse ([place])   Impaired fasting glucose    Expected Discharge Date: Expected Discharge Date: 01/10/23  Team Members Present: Physician leading conference: Dr. Alysia Penna Social Worker Present: Erlene Quan, BSW Nurse Present: Dorien Chihuahua, RN PT Present: Alden Hipp, PT OT Present: Meriel Pica, OT PPS Coordinator present : Gunnar Fusi, SLP     Current Status/Progress Goal Weekly Team Focus  Bowel/Bladder   Pt is continent of bowel/bladder   Pt will remain continent of bowel/bladder   Will assess qshift and PRN    Swallow/Nutrition/ Hydration               ADL's   overall supervision but very close to independent for ADLs and functional mobility, LUE progressing well, continues to present with decreased sensation in L digits impacting LUE Ray.   independent   LUE FMC, compensatory strategies for impaired sensation    Mobility   Independent for bed mobility, I for transfers Close supervision for gait >1012f and IV pole management,   Independent  Balance, LLE NMR, pt education, safet awareness, gait    Communication                Safety/Cognition/ Behavioral Observations               Pain   Pt denies pain   Pt will continue to deny pain   Will assess qshift and PRN    Skin   Pt's skin is intact   Pt's skin will remain intact  Will assess qshift  and PRN      Discharge Planning:  Discharging home with mother able to provide intermittent support due to work schuedle.   Team Discussion: Patient with COPD; cough, MD adjust inhalers.  Note sporadic attention deficits with fine motor coordination issues, working on fine tuning balance issues but limited by left knee pain (previous injury) with deep squats and twisting.  Patient on target to meet rehab goals: Yes, currently able to ambulate up to 1000' on various surfaces.  *See Care Plan and progress notes for long and short-term goals.   Revisions to Treatment Plan:  SLP eval and signed off; at baseline X-ray left knee Continue IV abx through 01/09/23; the Bicillin.  Teaching Needs: Safety, medications, dietary modification, transfers, fine motor/balance issues.   Current Barriers to Discharge: Decreased caregiver support and IV antibiotics  Possible Resolutions to Barriers: Family education OP follow up services     Medical Summary Current Status: On IV Abx for neurosyphilis, left hemisensory and fine motor deficits, L visuoperceptual deficits  Barriers to Discharge: Infection/IV Antibiotics       Continued Need for Acute Rehabilitation Level of Care: The patient requires daily medical management by a physician with specialized training in physical medicine and rehabilitation for the following reasons: Direction of a multidisciplinary physical rehabilitation program to maximize  functional independence : Yes Medical management of patient stability for increased activity during participation in an intensive rehabilitation regime.: Yes Analysis of laboratory values and/or radiology reports with any subsequent need for medication adjustment and/or medical intervention. : Yes   I attest that I was present, lead the team conference, and concur with the assessment and plan of the team.   Dorien Chihuahua B 01/08/2023, 7:33 AM

## 2023-01-08 NOTE — Progress Notes (Signed)
Linna Hoff PA, will assess patient. Call placed to charge nurse awaiting a return call regarding the above.

## 2023-01-09 ENCOUNTER — Other Ambulatory Visit (HOSPITAL_COMMUNITY): Payer: Self-pay

## 2023-01-09 DIAGNOSIS — I63511 Cerebral infarction due to unspecified occlusion or stenosis of right middle cerebral artery: Secondary | ICD-10-CM | POA: Diagnosis not present

## 2023-01-09 DIAGNOSIS — I5021 Acute systolic (congestive) heart failure: Secondary | ICD-10-CM

## 2023-01-09 LAB — BASIC METABOLIC PANEL
Anion gap: 12 (ref 5–15)
BUN: 19 mg/dL (ref 6–20)
CO2: 22 mmol/L (ref 22–32)
Calcium: 9.1 mg/dL (ref 8.9–10.3)
Chloride: 103 mmol/L (ref 98–111)
Creatinine, Ser: 1.14 mg/dL (ref 0.61–1.24)
GFR, Estimated: >60 mL/min (ref >60)
Glucose, Bld: 122 mg/dL — ABNORMAL HIGH (ref 70–99)
Potassium: 3.5 mmol/L (ref 3.5–5.1)
Sodium: 137 mmol/L (ref 135–145)

## 2023-01-09 MED ORDER — NICOTINE POLACRILEX 2 MG MT GUM: 2.0000 mg | CHEWING_GUM | OROMUCOSAL | 0 refills | Status: DC | PRN

## 2023-01-09 MED ORDER — NICOTINE POLACRILEX 2 MG MT GUM: 2.0000 mg | CHEWING_GUM | OROMUCOSAL | Status: DC | PRN

## 2023-01-09 NOTE — TOC Transition Note (Signed)
Discharge medications (6) are being stored in the main pharmacy on the ground floor until patient is ready for discharge.   

## 2023-01-09 NOTE — Progress Notes (Signed)
Occupational Therapy Discharge Summary  Patient Details  Name: NAHIM SWINEY MRN: [id number] Date of Birth: 12-30-1980  Date of Discharge from [place] service:January 09, 2023  Today's Date: 01/09/2023   Patient has met 61 of 11 long term goals due to improved balance, ability to compensate for deficits, functional use of  LEFT upper extremity, improved awareness, and improved coordination.  Patient to discharge at overall Independent level.  Patient's care partner is independent to provide the necessary physical assistance at discharge.    Reasons goals not met: NA  Recommendation:  Patient will benefit from ongoing skilled OT services in outpatient setting to continue to advance functional skills in the area of iADL and Vocation.  Equipment: NA  Reasons for discharge: treatment goals met  Patient/family agrees with progress made and goals achieved: Yes  OT Discharge  ADL ADL Eating: Independent Grooming: Independent Upper Body Bathing: Independent Where Assessed-Upper Body Bathing: Shower Lower Body Bathing: Independent Where Assessed-Lower Body Bathing: Shower Upper Body Dressing: Independent Where Assessed-Upper Body Dressing:  (standing) Lower Body Dressing: Independent Where Assessed-Lower Body Dressing:  (standing) Toileting: Independent Where Assessed-Toileting: Glass blower/designer: Programmer, applications Method: Magazine features editor: IT consultant Method: Heritage manager:  (standing) Vision Baseline Vision/History: 0 No visual deficits Patient Visual Report: No change from baseline Vision Assessment?: Yes Eye Alignment: Within Functional Limits Ocular Range of Motion: Within Functional Limits Alignment/Gaze Preference: Within Defined Limits Tracking/Visual Pursuits: Able to track stimulus in all quads without difficulty Saccades: Within functional limits Convergence: Within functional  limits Visual Fields: No apparent deficits Perception  Perception: Impaired (mild L inattention) Praxis Praxis: Intact Cognition Cognition Overall Cognitive Status: Within Functional Limits for tasks assessed Arousal/Alertness: Awake/alert Orientation Level: Person;Place;Situation Memory: Appears intact Brief Interview for Mental Status (BIMS) Repetition of Three Words (First Attempt): 3 Temporal Orientation: Year: Correct Temporal Orientation: Month: Accurate within 5 days Temporal Orientation: Day: Correct Recall: "Sock": Yes, no cue required Recall: "Blue": Yes, no cue required Recall: "Bed": Yes, no cue required BIMS Summary Score: 15 Sensation Sensation Light Touch: Appears Intact Hot/Cold: Appears Intact Proprioception: Appears Intact Stereognosis: Not tested Coordination Gross Motor Movements are Fluid and Coordinated: No Fine Motor Movements are Fluid and Coordinated: No Motor  Motor Motor: Hemiplegia Motor - Discharge Observations: mild incoordination Mobility  Bed Mobility Bed Mobility: Supine to Sit;Sitting - Scoot to [employer] of Bed;Sit to Supine Supine to Sit: Independent Sitting - Scoot to [employer] of Bed: Independent Sit to Supine: Independent Transfers Sit to Stand: Independent Stand to Sit: Independent  Trunk/Postural Assessment  Cervical Assessment Cervical Assessment: Within Functional Limits Thoracic Assessment Thoracic Assessment: Within Functional Limits Lumbar Assessment Lumbar Assessment: Within Functional Limits Postural Control Postural Control: Within Functional Limits  Balance Standardized Balance Assessment Standardized Balance Assessment: Functional Gait Assessment;Berg Balance Test Berg Balance Test Sit to Stand: Able to stand without using hands and stabilize independently Standing Unsupported: Able to stand safely 2 minutes Sitting with Back Unsupported but Feet Supported on Floor or Stool: Able to sit safely and securely 2  minutes Stand to Sit: Sits safely with minimal use of hands Transfers: Able to transfer safely, minor use of hands Standing Unsupported with Eyes Closed: Able to stand 10 seconds safely Standing Ubsupported with Feet Together: Able to place feet together independently and stand 1 minute safely From Standing, Reach Forward with Outstretched Arm: Can reach confidently >25 cm (10") From Standing Position, Pick up Object from Floor: Able to pick up shoe safely and easily  From Standing Position, Turn to Look Behind Over each Shoulder: Looks behind from both sides and weight shifts well Turn 360 Degrees: Able to turn 360 degrees safely in 4 seconds or less Standing Unsupported, Alternately Place Feet on Step/Stool: Able to stand independently and safely and complete 8 steps in 20 seconds Standing Unsupported, One Foot in Front: Able to place foot tandem independently and hold 30 seconds Standing on One Leg: Able to lift leg independently and hold > 10 seconds Total Score: 56 Static Sitting Balance Static Sitting - Level of Assistance: 7: Independent Dynamic Sitting Balance Dynamic Sitting - Level of Assistance: 7: Independent Static Standing Balance Static Standing - Level of Assistance: 7: Independent Dynamic Standing Balance Dynamic Standing - Level of Assistance: 7: Independent Functional Gait  Assessment Gait Level Surface: Walks 20 ft in less than 7 sec but greater than 5.5 sec, uses assistive device, slower speed, mild gait deviations, or deviates 6-10 in outside of the 12 in walkway width. Change in Gait Speed: Able to smoothly change walking speed without loss of balance or gait deviation. Deviate no more than 6 in outside of the 12 in walkway width. Gait with Horizontal Head Turns: Performs head turns smoothly with no change in gait. Deviates no more than 6 in outside 12 in walkway width Gait with Vertical Head Turns: Performs task with slight change in gait velocity (eg, minor disruption  to smooth gait path), deviates 6 - 10 in outside 12 in walkway width or uses assistive device Gait and Pivot Turn: Pivot turns safely within 3 sec and stops quickly with no loss of balance. Step Over Obstacle: Is able to step over 2 stacked shoe boxes taped together (9 in total height) without changing gait speed. No evidence of imbalance. Gait with Narrow Base of Support: Is able to ambulate for 10 steps heel to toe with no staggering. Gait with Eyes Closed: Walks 20 ft, uses assistive device, slower speed, mild gait deviations, deviates 6-10 in outside 12 in walkway width. Ambulates 20 ft in less than 9 sec but greater than 7 sec. Ambulating Backwards: Walks 20 ft, no assistive devices, good speed, no evidence for imbalance, normal gait Steps: Alternating feet, must use rail. Total Score: 26 Extremity/Trunk Assessment RUE Assessment RUE Assessment: Within Functional Limits LUE Assessment LUE Assessment: Exceptions to [facility] Active Range of Motion (AROM) Comments: WFL except for digits General Strength Comments: sh 4-, elbow 4+, wrist ext 4-;  grasp L 42, R 83 lbs, lateral pinch L 15 lbs, R 26 lbs LUE Body System: Neuro Brunstrum levels for arm and hand: Arm;Hand Brunstrum level for arm: Stage V Relative Independence from Synergy Brunstrum level for hand: Stage VI Isolated joint movements   [name] 01/09/2023, 8:59 AM

## 2023-01-09 NOTE — Progress Notes (Addendum)
Rounding Note    Patient Name: Jeremy Fritz Date of Encounter: 01/09/2023  [place] Cardiologist: Shelva Majestic, MD   Subjective   Feeling well.  Breathing much better.   Inpatient Medications    Scheduled Meds:  aspirin EC  81 mg Oral Daily   atorvastatin  40 mg Oral Daily   carvedilol  12.5 mg Oral BID WC   clopidogrel  75 mg Oral Daily   empagliflozin  10 mg Oral Daily   enoxaparin (LOVENOX) injection  40 mg Subcutaneous Q24H   nicotine  21 mg Transdermal Daily   [START ON 01/10/2023] penicillin g benzathine (BICILLIN-LA) IM  2.4 Million Units Intramuscular Once   sacubitril-valsartan  1 tablet Oral BID   spironolactone  12.5 mg Oral Daily   Continuous Infusions:  penicillin G potassium 12 Million Units in dextrose 5 % 500 mL CONTINUOUS infusion 12 Million Units (01/09/23 0200)   PRN Meds: acetaminophen, alum & mag hydroxide-simeth, bisacodyl, diphenhydrAMINE, guaiFENesin-dextromethorphan, ipratropium-albuterol, polyethylene glycol, prochlorperazine **OR** prochlorperazine **OR** prochlorperazine, sodium phosphate, traZODone   Vital Signs    Vitals:   01/09/23 0142 01/09/23 0529 01/09/23 1009 01/09/23 1322  BP: 102/80 110/75 93/69 105/68  Pulse: 84 87 82 91  Resp: 17 18 19 18   Temp: 98.5 F (36.9 C) 98.4 F (36.9 C) 98 F (36.7 C) 98 F (36.7 C)  TempSrc: Oral Oral    SpO2: 97% 96% 95% 97%  Weight:  70.8 kg    Height:        Intake/Output Summary (Last 24 hours) at 01/09/2023 1408 Last data filed at 01/09/2023 0733 Gross per 24 hour  Intake 2064.3 ml  Output 800 ml  Net 1264.3 ml      01/09/2023    5:29 AM 01/08/2023    5:00 AM 01/07/2023    5:00 AM  Last 3 Weights  Weight (lbs) 156 lb 1.4 oz 169 lb 12.1 oz 163 lb 9.3 oz  Weight (kg) 70.8 kg 77 kg 74.2 kg      Telemetry    N/a - Personally Reviewed  ECG    N/a - Personally Reviewed  Physical Exam   VS:  BP 105/68 (BP Location: Left Arm)   Pulse 91   Temp 98 F (36.7 C)    Resp 18   Ht 6' (1.829 m)   Wt 70.8 kg   SpO2 97%   BMI 21.17 kg/m  , BMI Body mass index is 21.17 kg/m. GENERAL:  Well appearing HEENT: Pupils equal round and reactive, fundi not visualized, oral mucosa unremarkable NECK:  No jugular venous distention, waveform within normal limits, carotid upstroke brisk and symmetric, no bruits, no thyromegaly LUNGS:  Clear to auscultation bilaterally HEART:  RRR.  PMI not displaced or sustained,S1 and S2 within normal limits, no S3, no S4, no clicks, no rubs, no murmurs ABD:  Flat, positive bowel sounds normal in frequency in pitch, no bruits, no rebound, no guarding, no midline pulsatile mass, no hepatomegaly, no splenomegaly EXT:  2 plus pulses throughout, no edema, no cyanosis no clubbing SKIN:  No rashes no nodules NEURO:  L sided weakness PSYCH:  Cognitively intact, oriented to person place and time   Labs    High Sensitivity Troponin:  No results for input(s): "TROPONINIHS" in the last 720 hours.   Chemistry Recent Labs  Lab 01/08/23 0625 01/08/23 0959 01/09/23 0852  NA 137 138 137  K 4.1 4.0 3.5  CL 103 108 103  CO2 21* 22 22  GLUCOSE 113* 113* 122*  BUN 18 17 19   CREATININE 1.08 1.04 1.14  CALCIUM 8.9 8.9 9.1  GFRNONAA >60 >60 >60  ANIONGAP 13 8 12     Lipids No results for input(s): "CHOL", "TRIG", "HDL", "LABVLDL", "LDLCALC", "CHOLHDL" in the last 168 hours.  Hematology Recent Labs  Lab 01/05/23 1414 01/08/23 0625  WBC 6.4 11.5*  RBC 4.14* 4.45  HGB 12.0* 13.1  HCT 36.3* 38.0*  MCV 87.7 85.4  MCH 29.0 29.4  MCHC 33.1 34.5  RDW 15.4 15.6*  PLT 334 288   Thyroid No results for input(s): "TSH", "FREET4" in the last 168 hours.  BNPNo results for input(s): "BNP", "PROBNP" in the last 168 hours.  DDimer No results for input(s): "DDIMER" in the last 168 hours.   Radiology    DG CHEST PORT 1 VIEW  Result Date: 01/08/2023 CLINICAL DATA:  Shortness of breath. EXAM: PORTABLE CHEST 1 VIEW COMPARISON:  None Available.  FINDINGS: Hazy bilateral airspace disease. A few Kerley lines are present. Borderline heart size for technique. No effusion or pneumothorax. IMPRESSION: Bilateral airspace disease which could be edema or multifocal pneumonia. Electronically Signed   By: Jorje Guild M.D.   On: 01/08/2023 06:45    Cardiac Studies   TTE 12/26/2022  IMPRESSIONS   1. Left ventricular ejection fraction, by estimation, is 25 to 30%. The left ventricle has severely decreased function. The left ventricle demonstrates global hypokinesis. The left ventricular internal cavity size  was moderately dilated. There is severe  left ventricular hypertrophy of the inferior segment. Left ventricular  diastolic function could not be evaluated. There is no LV thrombus visualized.   2. Right ventricular systolic function mildly reduced with normal basal excursion. The right ventricular size is mild to moderately. Tricuspid regurgitation signal is inadequate for assessing PA pressure.    3. Left atrial size was mildly dilated.   4. Right atrial size was mildly dilated.   5. A small pericardial effusion is present. The pericardial effusion is  circumferential.   6. MR mechanism appears ventricular functional in nature, with blunting  of the right sided pulmonary vein Doppler. The mitral valve is normal in  structure. Moderate to severe mitral valve regurgitation.   7. The aortic valve is tricuspid. Aortic valve regurgitation is not  visualized. No aortic stenosis is present.   8. The inferior vena cava is dilated in size with <50% respiratory  variability, suggesting right atrial pressure of 15 mmHg.   Patient Profile     42 y.o. male with polysubstance abuse, acute ischemic R MCA stroke and acute systolic and diastolic and diastolic HF.  Assessment & Plan    # Acute systolic and diastolic HF: # Hypertension: # Moderate to severe MR:  LVEF 20-25%.  Newly diagnosed this admission. Volume status improved with diuresis and he  is feeling much better.  BP now better controlled after adding spironolactone.  We discussed 1532mL fluid restriction and 2000mg  sodium restriction.  He has a scale at home and will weigh daily.  Discussed the importance of abstaining from illicit substances.  He will need a coronary CT-A vs. Stress MRI as an outpatient to rule out ischemia.  He has no ischemic symptoms.  Continue carvedilol, Jardiance, Entresto and spironolactone.  Will need repeat LVEF assessment in 3 months.   # R MCA stroke: TEE without LAA or LV thrombus.  Continue aspirin and clopidgorel.  BP control as above.  # OSA:  Needs outpatient sleep study.  Brady will sign off.   Medication Recommendations:  no changes Other recommendations (labs, testing, etc):  outpatient ischemic evaluation.  Echo in 3 months Follow up as an outpatient:  scheduled 01/26/23  For questions or updates, please contact [place] Please consult [email or web address] for contact info under        Signed, Skeet Latch, MD  01/09/2023, 2:08 PM

## 2023-01-09 NOTE — Progress Notes (Signed)
Occupational Therapy Session Note  Patient Details  Name: Jeremy Fritz MRN: [id number] Date of Birth: 1981-07-18  Today's Date: 01/09/2023 OT Individual Time: 1101-1200 OT Individual Time Calculation (min): 59 min    Short Term Goals: Week 1:  OT Short Term Goal 1 (Week 1): STGs = LTGs  Skilled Therapeutic Interventions/Progress Updates:  Pain reported during session as 0/10. Marland Kitchen Patient actively participated in the [place] program in a group setting for social participation. Session focused on education and training in breathing techniques to regulate the nervous system, gentle yoga poses with a focus on dynamic standing balance, guided meditation to regulate attention and guided discussion on the topic of resilence. Patient required MIN cues and CGA assist for standing poses. Exited session with pt seated in recliner.    Therapy Documentation Precautions:  Precautions Precautions: Fall Precaution Comments: L inattention Restrictions Weight Bearing Restrictions: No  Therapy/Group: Individual Therapy  Corinne Ports [facility] 01/09/2023, 12:28 PM

## 2023-01-09 NOTE — Progress Notes (Signed)
PROGRESS NOTE   Subjective/Complaints:  Breathing was better last night, Large volume UO yesterday   ROS: Patient denies CP, SOB, N/V/D Objective:   DG CHEST PORT 1 VIEW  Result Date: 01/08/2023 CLINICAL DATA:  Shortness of breath. EXAM: PORTABLE CHEST 1 VIEW COMPARISON:  None Available. FINDINGS: Hazy bilateral airspace disease. A few Kerley lines are present. Borderline heart size for technique. No effusion or pneumothorax. IMPRESSION: Bilateral airspace disease which could be edema or multifocal pneumonia. Electronically Signed   By: Jorje Guild M.D.   On: 01/08/2023 06:45   Recent Labs    01/08/23 0625  WBC 11.5*  HGB 13.1  HCT 38.0*  PLT 288     Recent Labs    01/08/23 0959 01/09/23 0852  NA 138 137  K 4.0 3.5  CL 108 103  CO2 22 22  GLUCOSE 113* 122*  BUN 17 19  CREATININE 1.04 1.14  CALCIUM 8.9 9.1      Intake/Output Summary (Last 24 hours) at 01/09/2023 1225 Last data filed at 01/09/2023 [id number] Gross per 24 hour  Intake 2064.3 ml  Output 1150 ml  Net 914.3 ml         Physical Exam: Vital Signs Blood pressure 93/69, pulse 82, temperature 98 F (36.7 C), resp. rate 19, height 6' (1.829 m), weight 70.8 kg, SpO2 95 %.   General: No acute distress Mood and affect are appropriate Heart: Regular rate and rhythm no rubs murmurs or extra sounds Lungs: Clear to auscultation, breathing unlabored, no rales or wheezes Abdomen: Positive bowel sounds, soft nontender to palpation, nondistended Extremities: No clubbing, cyanosis, or edema Skin: No evidence of breakdown, no evidence of rash, RUE IV site CDI  Neurologic: Cranial nerves II through XII intact, motor strength is 5/5 in RIght  deltoid, bicep, tricep, grip. LUE 4/5 Delt, bic, tri, 3- finger flex/ext  Left UE absent LT sensation with decreased Tat Momoli, able to touch finger to thumb slowly on the left side   Musculoskeletal: Full range of motion in  all 4 extremities. No joint swelling   Assessment/Plan: 1. Functional deficits which require 3+ hours per day of interdisciplinary therapy in a comprehensive inpatient rehab setting. Physiatrist is providing close team supervision and 24 hour management of active medical problems listed below. Physiatrist and rehab team continue to assess barriers to discharge/monitor patient progress toward functional and medical goals  Care Tool:  Bathing    Body parts bathed by patient: Right arm, Left arm, Chest, Abdomen, Front perineal area, Buttocks, Right upper leg, Left upper leg, Right lower leg, Left lower leg, Face         Bathing assist Assist Level: Independent     Upper Body Dressing/Undressing Upper body dressing   What is the patient wearing?: Pull over shirt    Upper body assist Assist Level: Independent    Lower Body Dressing/Undressing Lower body dressing      What is the patient wearing?: Underwear/pull up, Pants     Lower body assist Assist for lower body dressing: Independent     Toileting Toileting    Toileting assist Assist for toileting: Independent     Transfers Chair/bed transfer  Transfers assist  Chair/bed transfer assist level: Independent     Locomotion Ambulation   Ambulation assist      Assist level: Contact Guard/Touching assist Assistive device: No Device Max distance: 150   Walk 10 feet activity   Assist     Assist level: Contact Guard/Touching assist Assistive device: No Device   Walk 50 feet activity   Assist    Assist level: Contact Guard/Touching assist Assistive device: No Device    Walk 150 feet activity   Assist    Assist level: Contact Guard/Touching assist Assistive device: No Device    Walk 10 feet on uneven surface  activity   Assist     Assist level: Contact Guard/Touching assist     Wheelchair     Assist Is the patient using a wheelchair?: Yes Type of Wheelchair: Manual     Wheelchair assist level: Dependent - Patient 0% Max wheelchair distance: 150'    Wheelchair 50 feet with 2 turns activity    Assist        Assist Level: Dependent - Patient 0%   Wheelchair 150 feet activity     Assist      Assist Level: Dependent - Patient 0%   Blood pressure 93/69, pulse 82, temperature 98 F (36.7 C), resp. rate 19, height 6' (1.829 m), weight 70.8 kg, SpO2 95 %.  Medical Problem List and Plan: 1. 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 Cocaine + tobacco abuse, has cardiomyopathy but TEE neg for clot or veg             -patient may shower          discharge in am 3/16 after Bicillin IM injection  Will need cardiology , ID, Neuro and PMR f/u as well as establish with PCP             -Continue CIR therapies including PT, OT   2.  Antithrombotics: -DVT/anticoagulation:  Pharmaceutical: Lovenox             -antiplatelet therapy: DAPT X 3 months followed by ASA alone.  3. Pain Management: Tylenol prn.  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  4. Mood/Behavior/Sleep: LCSW to follow for evaluation and support.              -antipsychotic agents: N/A 5. Neuropsych/cognition: This patient is capable of making decisions on his own behalf. 6. Skin/Wound Care: Routine pressure relief measures.  7. Fluids/Electrolytes/Nutrition: Monitor I/O. Check CMET in am.  8. NICM EF-25-30%: Strict I/O- 1800 cc FR w/heart healthy diet. F/u cardiology will ask them to review meds prior to discharge,per cardiology Entresto 24/26 restarted Aldactone 12.5mg  started Jardiance 10mg  qd continued Carvedilol 12.5 mg BID continued 9. HTN: Monitor BP TID--see above Vitals:   01/09/23 0529 01/09/23 1009  BP: 110/75 93/69  Pulse: 87 82  Resp: 18 19  Temp: 98.4 F (36.9 C) 98 F (36.7 C)  SpO2: 96% 95%    10. 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 IM on 3/16, has ID f/u appt in April   11. OSA: Has not had CPAP for 6 months. Does not have PCP. 12. H/o cocaine/THC abuse:  Encourage cessation/healthy lifestyle. Neuropsych to counsel 13. Tachycardia: improved after diuresis  14.  Probable COPD- alb neb qhs may be interfering with sleep, has some secretions change to atrovent QID   LOS: 9 days A FACE TO FACE EVALUATION WAS PERFORMED  Charlett Blake 01/09/2023, 12:25 PM

## 2023-01-09 NOTE — Progress Notes (Signed)
Occupational Therapy Session Note  Patient Details  Name: Jeremy Fritz MRN: [id number] Date of Birth: Sep 25, 1981  Today's Date: 01/09/2023 OT Individual Time: [id number] OT Individual Time Calculation (min): 55 min    Short Term Goals: Week 1:  OT Short Term Goal 1 (Week 1): STGs = LTGs  Skilled Therapeutic Interventions/Progress Updates:  Pt greeted seated EOB reporting feeling better today, pt agreeable to OT intervention. Session focus on BADL reeducation, functional mobility, dynamic standing balance and decreasing overall caregiver burden.             Pt completed ambulatory transfer to toilet and shower off IV independently. Pt completed all bathing and dressing tasks independently.   Retested pt on required DC asssessments as indicated below.        Ended session with pt handed off to PT.      Therapy Documentation Precautions:  Precautions Precautions: Fall Precaution Comments: L inattention Restrictions Weight Bearing Restrictions: No  Pain: no pain  Vision Baseline Vision/History: 0 No visual deficits Patient Visual Report: No change from baseline Vision Assessment?: Yes Eye Alignment: Within Functional Limits Ocular Range of Motion: Within Functional Limits Alignment/Gaze Preference: Within Defined Limits Tracking/Visual Pursuits: Able to track stimulus in all quads without difficulty Saccades: Within functional limits Convergence: Within functional limits Visual Fields: No apparent deficits Perception  Perception: Impaired (mild L inattention) Praxis Praxis: Intact Cognition Cognition Overall Cognitive Status: Within Functional Limits for tasks assessed Arousal/Alertness: Awake/alert Orientation Level: Person;Place;Situation Memory: Appears intact Brief Interview for Mental Status (BIMS) Repetition of Three Words (First Attempt): 3 Temporal Orientation: Year: Correct Temporal Orientation: Month: Accurate within 5 days Temporal Orientation: Day:  Correct Recall: "Sock": Yes, no cue required Recall: "Blue": Yes, no cue required Recall: "Bed": Yes, no cue required BIMS Summary Score: 15 Sensation Sensation Light Touch: Appears Intact Hot/Cold: Appears Intact Proprioception: Appears Intact Stereognosis: Not tested Coordination Gross Motor Movements are Fluid and Coordinated: No Fine Motor Movements are Fluid and Coordinated: No  Extremity/Trunk Assessment RUE Assessment RUE Assessment: Within Functional Limits LUE Assessment LUE Assessment: Exceptions to [facility] Active Range of Motion (AROM) Comments: WFL except for digits General Strength Comments: sh 4-, elbow 4+, wrist ext 4-;  grasp L 42, R 83 lbs, lateral pinch L 15 lbs, R 26 lbs LUE Body System: Neuro Brunstrum levels for arm and hand: Arm;Hand Brunstrum level for arm: Stage V Relative Independence from Synergy Brunstrum level for hand: Stage VI Isolated joint movements    Therapy/Group: Individual Therapy  Corinne Ports [facility] 01/09/2023, 12:13 PM

## 2023-01-09 NOTE — Progress Notes (Signed)
Patient ID: Jeremy Fritz, male   DOB: Jun 21, 1981, 42 y.o.   MRN: [id number]  New Patient Visit with Camillia Herter Tuesday March 31, 2023 3:00 PM   [facility] [address] [place] [address] [phone]

## 2023-01-09 NOTE — Plan of Care (Signed)
?  Problem: RH Balance ?Goal: LTG Patient will maintain dynamic sitting balance (PT) ?Description: LTG:  Patient will maintain dynamic sitting balance with assistance during mobility activities (PT) ?Outcome: Completed/Met ?Goal: LTG Patient will maintain dynamic standing balance (PT) ?Description: LTG:  Patient will maintain dynamic standing balance with assistance during mobility activities (PT) ?Outcome: Completed/Met ?  ?Problem: Sit to Stand ?Goal: LTG:  Patient will perform sit to stand with assistance level (PT) ?Description: LTG:  Patient will perform sit to stand with assistance level (PT) ?Outcome: Completed/Met ?  ?Problem: RH Bed to Chair Transfers ?Goal: LTG Patient will perform bed/chair transfers w/assist (PT) ?Description: LTG: Patient will perform bed to chair transfers with assistance (PT). ?Outcome: Completed/Met ?  ?Problem: RH Car Transfers ?Goal: LTG Patient will perform car transfers with assist (PT) ?Description: LTG: Patient will perform car transfers with assistance (PT). ?Outcome: Completed/Met ?  ?Problem: RH Ambulation ?Goal: LTG Patient will ambulate in controlled environment (PT) ?Description: LTG: Patient will ambulate in a controlled environment, # of feet with assistance (PT). ?Outcome: Completed/Met ?Goal: LTG Patient will ambulate in home environment (PT) ?Description: LTG: Patient will ambulate in home environment, # of feet with assistance (PT). ?Outcome: Completed/Met ?  ?

## 2023-01-09 NOTE — Progress Notes (Addendum)
Patient ID: Jeremy Fritz, male   DOB: 22-Jul-1981, 42 y.o.   MRN: [id number]  SW received update from patient that he is [facility]. Once records are released current sleep study will be faxed to SW.   1:47:  Sw received call for correction office. Medical release formed being emailed to SW. SW will wait for form, have patient sign and return today. No additional questions or concerns.   1:58: Records release form received, signed and sent back to  [email or web address]. Patient informed that CPAP machine will not be delivered until after discharge, potential to arrive next week once ordered. Once SW received copy of sleep study SW will make attempt to order the device through [employer].

## 2023-01-09 NOTE — Progress Notes (Signed)
Physical Therapy Discharge Summary  Patient Details  Name: Jeremy Fritz MRN: [id number] Date of Birth: [birth year]  Date of Discharge from PT service:January 09, 2023  Today's Date: 01/09/2023 PT Individual Time: 0902-1003 PT Individual Time Calculation (min): 61 min    Patient has met 8 of 8 long term goals due to improved activity tolerance, improved balance, improved postural control, increased strength, functional use of  left lower extremity, improved attention, improved awareness, and improved coordination.  Patient to discharge at an ambulatory level Independent.   Patient's care partner is independent to provide the necessary physical assistance at discharge.  Recommendation:  Patient will benefit from ongoing skilled PT services in outpatient setting to continue to advance safe functional mobility, address ongoing impairments in general strengthening, balance, postural control, coordination, endurance, safety awareness, and minimize fall risk.  Equipment: No equipment provided  Reasons for discharge: treatment goals met and discharge from hospital  Patient/family agrees with progress made and goals achieved: Yes  Pt received sitting at EOB and agreeable to PT services. Pt's grad day assessment performed by therapist as noted below. Pt has improved immensely since starting therapy. Pt remained connected to IV throughout session w/ therapist managing IV pole. MD entered for pt assessment. Pt  performed ~224ft of gait to main therapy gym w/ distant supervision assist. Performed 12 steps w/ BHR for stability. Pt demonstrated ability to get in an out the car functionally w/ supervision assist. Pt demonstrated functional gait when ascending and descending ramp and negotiating through uneven ground w/ supervision for IV pole management. Pt performed functional outcome measures, which include the BERG and FGA. Pt attained a 56/56 on the BERG and 26/30 on the FGA demonstrating reduced risk  for falls.   Pt ambulated ~361ft back to room and returned to sitting at EOB w/ distant supervision. Pt left w/ call bell in reach and all needs met.   PT Discharge Precautions/Restrictions Restrictions Weight Bearing Restrictions: No Vital Signs Therapy Vitals Temp: 98 F (36.7 C) Pulse Rate: 91 Resp: 18 BP: 105/68 Patient Position (if appropriate): Sitting Oxygen Therapy SpO2: 97 % O2 Device: Room Air Pain Pain Assessment Pain Scale: 0-10 Pain Score: 0-No pain Pain Interference Pain Interference Pain Effect on Sleep: 1. Rarely or not at all Pain Interference with Therapy Activities: 1. Rarely or not at all Pain Interference with Day-to-Day Activities: 1. Rarely or not at all Vision/Perception  Vision - History Ability to See in Adequate Light: 0 Adequate Vision - Assessment Eye Alignment: Within Functional Limits Ocular Range of Motion: Within Functional Limits Alignment/Gaze Preference: Within Defined Limits Tracking/Visual Pursuits: Able to track stimulus in all quads without difficulty Saccades: Within functional limits Convergence: Within functional limits Perception Perception: Impaired Praxis Praxis: Intact  Cognition Overall Cognitive Status: Within Functional Limits for tasks assessed Arousal/Alertness: Awake/alert Orientation Level: Oriented X4 Sensation Sensation Light Touch: Appears Intact Hot/Cold: Appears Intact Proprioception: Appears Intact Stereognosis: Not tested Coordination Gross Motor Movements are Fluid and Coordinated: No Motor  Motor Motor: Hemiplegia Motor - Discharge Observations: mild incoordination  Mobility Bed Mobility Bed Mobility: Supine to Sit;Sitting - Scoot to [employer] of Bed;Sit to Supine Supine to Sit: Independent Sitting - Scoot to [employer] of Bed: Independent Sit to Supine: Independent Transfers Transfers: Sit to Stand;Stand to [employer] Transfers Sit to Stand: Independent Stand to Sit: Independent Stand Pivot  Transfers: Independent Transfer (Assistive device): None Locomotion  Gait Ambulation: Yes Gait Assistance: Independent Gait Distance (Feet): 200 Feet Assistive device: None Gait Gait: Yes Gait  Pattern: Within Functional Limits Stairs / Additional Locomotion Stairs: Yes Stairs Assistance: Independent Number of Stairs: 12 Height of Stairs: 6 Ramp: Independent Curb: Independent Pick up small object from the floor assist level: Independent Wheelchair Mobility Wheelchair Mobility: No  Trunk/Postural Assessment  Cervical Assessment Cervical Assessment: Within Functional Limits Thoracic Assessment Thoracic Assessment: Within Functional Limits Lumbar Assessment Lumbar Assessment: Within Functional Limits Postural Control Postural Control: Within Functional Limits  Balance Standardized Balance Assessment Standardized Balance Assessment: Functional Gait Assessment;Berg Balance Test Berg Balance Test Sit to Stand: Able to stand without using hands and stabilize independently Standing Unsupported: Able to stand safely 2 minutes Sitting with Back Unsupported but Feet Supported on Floor or Stool: Able to sit safely and securely 2 minutes Stand to Sit: Sits safely with minimal use of hands Transfers: Able to transfer safely, minor use of hands Standing Unsupported with Eyes Closed: Able to stand 10 seconds safely Standing Ubsupported with Feet Together: Able to place feet together independently and stand 1 minute safely From Standing, Reach Forward with Outstretched Arm: Can reach confidently >25 cm (10") From Standing Position, Pick up Object from Floor: Able to pick up shoe safely and easily From Standing Position, Turn to Look Behind Over each Shoulder: Looks behind from both sides and weight shifts well Turn 360 Degrees: Able to turn 360 degrees safely in 4 seconds or less Standing Unsupported, Alternately Place Feet on Step/Stool: Able to stand independently and safely and complete  8 steps in 20 seconds Standing Unsupported, One Foot in Front: Able to place foot tandem independently and hold 30 seconds Standing on One Leg: Able to lift leg independently and hold > 10 seconds Total Score: 56 Static Sitting Balance Static Sitting - Level of Assistance: 7: Independent Dynamic Sitting Balance Dynamic Sitting - Level of Assistance: 7: Independent Static Standing Balance Static Standing - Level of Assistance: 7: Independent Dynamic Standing Balance Dynamic Standing - Level of Assistance: 7: Independent Functional Gait  Assessment Gait Level Surface: Walks 20 ft in less than 7 sec but greater than 5.5 sec, uses assistive device, slower speed, mild gait deviations, or deviates 6-10 in outside of the 12 in walkway width. Change in Gait Speed: Able to smoothly change walking speed without loss of balance or gait deviation. Deviate no more than 6 in outside of the 12 in walkway width. Gait with Horizontal Head Turns: Performs head turns smoothly with no change in gait. Deviates no more than 6 in outside 12 in walkway width Gait with Vertical Head Turns: Performs task with slight change in gait velocity (eg, minor disruption to smooth gait path), deviates 6 - 10 in outside 12 in walkway width or uses assistive device Gait and Pivot Turn: Pivot turns safely within 3 sec and stops quickly with no loss of balance. Step Over Obstacle: Is able to step over 2 stacked shoe boxes taped together (9 in total height) without changing gait speed. No evidence of imbalance. Gait with Narrow Base of Support: Is able to ambulate for 10 steps heel to toe with no staggering. Gait with Eyes Closed: Walks 20 ft, uses assistive device, slower speed, mild gait deviations, deviates 6-10 in outside 12 in walkway width. Ambulates 20 ft in less than 9 sec but greater than 7 sec. Ambulating Backwards: Walks 20 ft, no assistive devices, good speed, no evidence for imbalance, normal gait Steps: Alternating feet,  must use rail. Total Score: 26 Extremity Assessment      RLE Assessment RLE Assessment: Exceptions to [facility]  General Strength Comments: Grossly 4/5 LLE Assessment LLE Assessment: Exceptions to [facility] General Strength Comments: grossly 4/5   Jasmeen Fritsch 01/09/2023, 3:44 PM

## 2023-01-10 LAB — BASIC METABOLIC PANEL
Anion gap: 13 (ref 5–15)
BUN: 22 mg/dL — ABNORMAL HIGH (ref 6–20)
CO2: 24 mmol/L (ref 22–32)
Calcium: 9.1 mg/dL (ref 8.9–10.3)
Chloride: 100 mmol/L (ref 98–111)
Creatinine, Ser: 1.15 mg/dL (ref 0.61–1.24)
GFR, Estimated: >60 mL/min (ref >60)
Glucose, Bld: 168 mg/dL — ABNORMAL HIGH (ref 70–99)
Potassium: 4.2 mmol/L (ref 3.5–5.1)
Sodium: 137 mmol/L (ref 135–145)

## 2023-01-10 NOTE — Progress Notes (Signed)
PROGRESS NOTE   Subjective/Complaints:  No acute complaints.  No events overnight.  Feels stable ready for discharge today.  Does inquire about use of nebulizer treatments at home, stated that if due to pulmonary effusion these are more for symptoms than disease treatment and likely are not needed.  ROS: Patient denies CP, SOB, N/V/D Objective:   No results found. Recent Labs    01/08/23 0625  WBC 11.5*  HGB 13.1  HCT 38.0*  PLT 288     Recent Labs    01/09/23 0852 01/10/23 0715  NA 137 137  K 3.5 4.2  CL 103 100  CO2 22 24  GLUCOSE 122* 168*  BUN 19 22*  CREATININE 1.14 1.15  CALCIUM 9.1 9.1      Intake/Output Summary (Last 24 hours) at 01/10/2023 2127 Last data filed at 01/10/2023 [id number] Gross per 24 hour  Intake 237 ml  Output 550 ml  Net -313 ml         Physical Exam: Vital Signs Blood pressure 106/78, pulse 93, temperature 97.7 F (36.5 C), resp. rate 15, height 6' (1.829 m), weight 68.8 kg, SpO2 100 %.   General: No acute distress Mood and affect are appropriate Heart: Regular rate and rhythm no rubs murmurs or extra sounds Lungs: Clear to auscultation, breathing unlabored, no rales or wheezes Abdomen: Positive bowel sounds, soft nontender to palpation, nondistended Extremities: No clubbing, cyanosis, or edema Skin: No evidence of breakdown, no evidence of rash, RUE IV site CDI  Neurologic: Cranial nerves II through XII intact, motor strength is 5/5 in Right ue LUE 4/5  Left UE absent LT sensation with decreased [place], able to touch finger to thumb slowly on the left side   Musculoskeletal: Full range of motion in all 4 extremities. No joint swelling   Assessment/Plan: 1. Functional deficits which require 3+ hours per day of interdisciplinary therapy in a comprehensive inpatient rehab setting. Physiatrist is providing close team supervision and 24 hour management of active medical problems  listed below. Physiatrist and rehab team continue to assess barriers to discharge/monitor patient progress toward functional and medical goals  Care Tool:  Bathing    Body parts bathed by patient: Right arm, Left arm, Chest, Abdomen, Front perineal area, Buttocks, Right upper leg, Left upper leg, Right lower leg, Left lower leg, Face         Bathing assist Assist Level: Independent     Upper Body Dressing/Undressing Upper body dressing   What is the patient wearing?: Pull over shirt    Upper body assist Assist Level: Independent    Lower Body Dressing/Undressing Lower body dressing      What is the patient wearing?: Underwear/pull up, Pants     Lower body assist Assist for lower body dressing: Independent     Toileting Toileting    Toileting assist Assist for toileting: Independent     Transfers Chair/bed transfer  Transfers assist     Chair/bed transfer assist level: Independent     Locomotion Ambulation   Ambulation assist      Assist level: Independent Assistive device: No Device Max distance: 250   Walk 10 feet activity   Assist  Assist level: Contact Guard/Touching assist Assistive device: No Device   Walk 50 feet activity   Assist    Assist level: Independent Assistive device: No Device    Walk 150 feet activity   Assist    Assist level: Independent Assistive device: No Device    Walk 10 feet on uneven surface  activity   Assist     Assist level: Independent     Wheelchair     Assist Is the patient using a wheelchair?: No Type of Wheelchair: Manual    Wheelchair assist level: Dependent - Patient 0% Max wheelchair distance: 150'    Wheelchair 50 feet with 2 turns activity    Assist        Assist Level: Dependent - Patient 0%   Wheelchair 150 feet activity     Assist      Assist Level: Dependent - Patient 0%   Blood pressure 106/78, pulse 93, temperature 97.7 F (36.5 C), resp.  rate 15, height 6' (1.829 m), weight 68.8 kg, SpO2 100 %.  Medical Problem List and Plan: 1. 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 Cocaine + tobacco abuse, has cardiomyopathy but TEE neg for clot or veg             -patient may shower          discharge in am 3/16 after Bicillin IM injection  Will need cardiology , ID, Neuro and PMR f/u as well as establish with PCP             -Medically stable for discharge 2.  Antithrombotics: -DVT/anticoagulation:  Pharmaceutical: Lovenox             -antiplatelet therapy: DAPT X 3 months followed by ASA alone.  3. Pain Management: Tylenol prn.  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  4. Mood/Behavior/Sleep: LCSW to follow for evaluation and support.              -antipsychotic agents: N/A 5. Neuropsych/cognition: This patient is capable of making decisions on his own behalf. 6. Skin/Wound Care: Routine pressure relief measures.  7. Fluids/Electrolytes/Nutrition: Monitor I/O. Check CMET in am.  8. NICM EF-25-30%: Strict I/O- 1800 cc FR w/heart healthy diet. F/u cardiology will ask them to review meds prior to discharge,per cardiology Entresto 24/26 restarted Aldactone 12.5mg  started Jardiance 10mg  qd continued Carvedilol 12.5 mg BID continued 9. HTN: Monitor BP TID--see above Vitals:   01/09/23 2007 01/10/23 0605  BP: (!) 122/95 106/78  Pulse: 78 93  Resp: 18 15  Temp: 98.1 F (36.7 C) 97.7 F (36.5 C)  SpO2: 98% 100%    10. 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 IM on 3/16, has ID f/u appt in April  11. OSA: Has not had CPAP for 6 months. Does not have PCP. 12. H/o cocaine/THC abuse:  Encourage cessation/healthy lifestyle. Neuropsych to counsel 13. Tachycardia: improved after diuresis  14.  Probable COPD- alb neb qhs may be interfering with sleep, has some secretions change to atrovent QID   LOS: 10 days A FACE TO FACE EVALUATION WAS  PERFORMED  Gertie Gowda 01/10/2023, 9:27 PM

## 2023-01-10 NOTE — Progress Notes (Signed)
Inpatient Rehabilitation Discharge Medication Review by a Pharmacist  A complete drug regimen review was completed for this patient to identify any potential clinically significant medication issues.  High Risk Drug Classes Is patient taking? Indication by Medication  Antipsychotic No   Anticoagulant No   Antibiotic No   Opioid No   Antiplatelet Yes ASA/Plavix - stroke ppx  Hypoglycemics/insulin Yes Jardiance - HF  Vasoactive Medication Yes Coreg, Entresto, spironolactone - HF/HTN  Chemotherapy No   Other Yes Trazodone - prn sleep Nicotine gum - smoking cessation Atorvastatin - HLD Albuterol - shortness of breath     Type of Medication Issue Identified Description of Issue Recommendation(s)  Drug Interaction(s) (clinically significant)     Duplicate Therapy     Allergy     No Medication Administration End Date     Incorrect Dose     Additional Drug Therapy Needed     Significant med changes from prior encounter (inform family/care partners about these prior to discharge). Dose of carvedilol increased on discharge Counsel patient and family prior to discharge  Other       Clinically significant medication issues were identified that warrant physician communication and completion of prescribed/recommended actions by midnight of the next day:  No  Pharmacist comments: DAPT x 3 months, then ASA alone  Time spent performing this drug regimen review (minutes):  [place], PharmD, [facility] PGY1 Pharmacy Resident 01/10/2023 7:20 AM

## 2023-01-12 NOTE — Progress Notes (Signed)
Patient ID: Jeremy Fritz, male   DOB: 06/26/1981, 42 y.o.   MRN: [id number]  3/18 9:32 AM: CPAP ordered through [employer].

## 2023-01-19 ENCOUNTER — Telehealth (HOSPITAL_COMMUNITY): Payer: Self-pay | Admitting: Adult Health

## 2023-01-26 ENCOUNTER — Encounter (HOSPITAL_COMMUNITY): Payer: Commercial Managed Care - HMO

## 2023-01-26 ENCOUNTER — Emergency Department (HOSPITAL_COMMUNITY): Payer: Commercial Managed Care - HMO

## 2023-01-26 ENCOUNTER — Encounter (HOSPITAL_COMMUNITY): Payer: Self-pay

## 2023-01-26 ENCOUNTER — Other Ambulatory Visit: Payer: Self-pay

## 2023-01-26 ENCOUNTER — Emergency Department (HOSPITAL_COMMUNITY)
Admission: EM | Admit: 2023-01-26 | Discharge: 2023-01-27 | Payer: Commercial Managed Care - HMO | Attending: Emergency Medicine | Admitting: Emergency Medicine

## 2023-01-26 DIAGNOSIS — Z7982 Long term (current) use of aspirin: Secondary | ICD-10-CM | POA: Insufficient documentation

## 2023-01-26 DIAGNOSIS — R200 Anesthesia of skin: Secondary | ICD-10-CM | POA: Insufficient documentation

## 2023-01-26 DIAGNOSIS — W268XXA Contact with other sharp object(s), not elsewhere classified, initial encounter: Secondary | ICD-10-CM | POA: Insufficient documentation

## 2023-01-26 DIAGNOSIS — I5020 Unspecified systolic (congestive) heart failure: Secondary | ICD-10-CM | POA: Insufficient documentation

## 2023-01-26 DIAGNOSIS — Z23 Encounter for immunization: Secondary | ICD-10-CM | POA: Diagnosis not present

## 2023-01-26 DIAGNOSIS — Y9302 Activity, running: Secondary | ICD-10-CM | POA: Insufficient documentation

## 2023-01-26 DIAGNOSIS — F1910 Other psychoactive substance abuse, uncomplicated: Secondary | ICD-10-CM | POA: Diagnosis not present

## 2023-01-26 DIAGNOSIS — S61211A Laceration without foreign body of left index finger without damage to nail, initial encounter: Secondary | ICD-10-CM | POA: Diagnosis not present

## 2023-01-26 DIAGNOSIS — Z7902 Long term (current) use of antithrombotics/antiplatelets: Secondary | ICD-10-CM | POA: Insufficient documentation

## 2023-01-26 MED ORDER — TETANUS-DIPHTH-ACELL PERTUSSIS 5-2.5-18.5 LF-MCG/0.5 IM SUSY
0.5000 mL | PREFILLED_SYRINGE | Freq: Once | INTRAMUSCULAR | Status: AC
  Administered 2023-01-27: 0.5 mL via INTRAMUSCULAR
  Filled 2023-01-26: qty 0.5

## 2023-01-26 NOTE — ED Triage Notes (Addendum)
PER EMS: pt arrives in police custody, they were chasing him tonight, caught him and he then reported numbness to both sets of toes and his left hand and left side of his face, onset today but will not state what time. During the chase he sustained abrasions to left fingers and left arm after jumping over barbed wire fence.   BP- 140/88, HR-88, 97% RA, CBG-140

## 2023-01-26 NOTE — ED Notes (Signed)
Dr. Cardama at bedside.  
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
## 2023-01-27 ENCOUNTER — Emergency Department (HOSPITAL_COMMUNITY): Payer: Commercial Managed Care - HMO

## 2023-01-27 LAB — DIFFERENTIAL
Abs Immature Granulocytes: 0.02 10*3/uL (ref 0.00–0.07)
Basophils Absolute: 0 10*3/uL (ref 0.0–0.1)
Basophils Relative: 1 %
Eosinophils Absolute: 0.1 10*3/uL (ref 0.0–0.5)
Eosinophils Relative: 2 %
Immature Granulocytes: 0 %
Lymphocytes Relative: 24 %
Lymphs Abs: 1.6 10*3/uL (ref 0.7–4.0)
Monocytes Absolute: 0.6 10*3/uL (ref 0.1–1.0)
Monocytes Relative: 9 %
Neutro Abs: 4.2 10*3/uL (ref 1.7–7.7)
Neutrophils Relative %: 64 %

## 2023-01-27 LAB — PROTIME-INR
INR: 1.2 (ref 0.8–1.2)
Prothrombin Time: 14.8 seconds (ref 11.4–15.2)

## 2023-01-27 LAB — COMPREHENSIVE METABOLIC PANEL
ALT: 57 U/L — ABNORMAL HIGH (ref 0–44)
AST: 27 U/L (ref 15–41)
Albumin: 2.8 g/dL — ABNORMAL LOW (ref 3.5–5.0)
Alkaline Phosphatase: 46 U/L (ref 38–126)
Anion gap: 6 (ref 5–15)
BUN: 16 mg/dL (ref 6–20)
CO2: 25 mmol/L (ref 22–32)
Calcium: 8.4 mg/dL — ABNORMAL LOW (ref 8.9–10.3)
Chloride: 110 mmol/L (ref 98–111)
Creatinine, Ser: 1.3 mg/dL — ABNORMAL HIGH (ref 0.61–1.24)
GFR, Estimated: >60 mL/min (ref >60)
Glucose, Bld: 101 mg/dL — ABNORMAL HIGH (ref 70–99)
Potassium: 3.3 mmol/L — ABNORMAL LOW (ref 3.5–5.1)
Sodium: 141 mmol/L (ref 135–145)
Total Bilirubin: 0.6 mg/dL (ref 0.3–1.2)
Total Protein: 5.8 g/dL — ABNORMAL LOW (ref 6.5–8.1)

## 2023-01-27 LAB — CBC
HCT: 37 % — ABNORMAL LOW (ref 39.0–52.0)
Hemoglobin: 12.3 g/dL — ABNORMAL LOW (ref 13.0–17.0)
MCH: 29.3 pg (ref 26.0–34.0)
MCHC: 33.2 g/dL (ref 30.0–36.0)
MCV: 88.1 fL (ref 80.0–100.0)
Platelets: 241 10*3/uL (ref 150–400)
RBC: 4.2 MIL/uL — ABNORMAL LOW (ref 4.22–5.81)
RDW: 15.9 % — ABNORMAL HIGH (ref 11.5–15.5)
WBC: 6.6 10*3/uL (ref 4.0–10.5)
nRBC: 0 % (ref 0.0–0.2)

## 2023-01-27 LAB — APTT: aPTT: 28 seconds (ref 24–36)

## 2023-01-27 LAB — I-STAT CHEM 8, ED
BUN: 16 mg/dL (ref 6–20)
Calcium, Ion: 1.2 mmol/L (ref 1.15–1.40)
Chloride: 106 mmol/L (ref 98–111)
Creatinine, Ser: 1.3 mg/dL — ABNORMAL HIGH (ref 0.61–1.24)
Glucose, Bld: 102 mg/dL — ABNORMAL HIGH (ref 70–99)
HCT: 38 % — ABNORMAL LOW (ref 39.0–52.0)
Hemoglobin: 12.9 g/dL — ABNORMAL LOW (ref 13.0–17.0)
Potassium: 3.3 mmol/L — ABNORMAL LOW (ref 3.5–5.1)
Sodium: 143 mmol/L (ref 135–145)
TCO2: 28 mmol/L (ref 22–32)

## 2023-01-27 LAB — RAPID URINE DRUG SCREEN, HOSP PERFORMED
Amphetamines: NOT DETECTED
Barbiturates: NOT DETECTED
Benzodiazepines: NOT DETECTED
Cocaine: POSITIVE — AB
Opiates: NOT DETECTED
Tetrahydrocannabinol: POSITIVE — AB

## 2023-01-27 LAB — TROPONIN I (HIGH SENSITIVITY)
Troponin I (High Sensitivity): 34 ng/L — ABNORMAL HIGH (ref <18)
Troponin I (High Sensitivity): 35 ng/L — ABNORMAL HIGH (ref <18)

## 2023-01-27 LAB — ETHANOL: Alcohol, Ethyl (B): <10 mg/dL (ref <10)

## 2023-01-27 LAB — BRAIN NATRIURETIC PEPTIDE: B Natriuretic Peptide: 334.9 pg/mL — ABNORMAL HIGH (ref 0.0–100.0)

## 2023-01-27 MED ORDER — SODIUM CHLORIDE (PF) 0.9 % IJ SOLN
INTRAMUSCULAR | Status: AC
  Filled 2023-01-27: qty 50

## 2023-01-27 MED ORDER — CEPHALEXIN 500 MG PO CAPS: 500.0000 mg | ORAL_CAPSULE | Freq: Three times a day (TID) | ORAL | 0 refills | Status: DC

## 2023-01-27 MED ORDER — CEPHALEXIN 500 MG PO CAPS: 500.0000 mg | ORAL_CAPSULE | Freq: Three times a day (TID) | ORAL | 0 refills | Status: AC

## 2023-01-27 MED ORDER — IOHEXOL 350 MG/ML SOLN
75.0000 mL | Freq: Once | INTRAVENOUS | Status: AC | PRN
  Administered 2023-01-27: 75 mL via INTRAVENOUS

## 2023-01-27 MED ORDER — CEPHALEXIN 500 MG PO CAPS
500.0000 mg | ORAL_CAPSULE | Freq: Once | ORAL | Status: AC
  Administered 2023-01-27: 500 mg via ORAL
  Filled 2023-01-27: qty 1

## 2023-01-30 ENCOUNTER — Encounter
Payer: Commercial Managed Care - HMO | Attending: Physical Medicine & Rehabilitation | Admitting: Physical Medicine & Rehabilitation
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
## 2023-02-06 ENCOUNTER — Encounter: Payer: Self-pay | Admitting: *Deleted

## 2023-02-17 ENCOUNTER — Ambulatory Visit: Payer: Commercial Managed Care - HMO | Admitting: Internal Medicine

## 2023-02-17 NOTE — Progress Notes (Deleted)
[facility]  CHIEF COMPLAINT:    Follow up for Neurosyphilis   SUBJECTIVE:    Jeremy Fritz is a 42 y.o. male with PMHx as below who presents to the clinic for neurosyphilis.   Patient was admitted at [facility] from 12/25/22 - 12/31/22 after presenting with acute right MCA infarct.  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  He declined LP initially and was started on IV Penicillin.  He then agreed to an LP but neurology deemed it not necessary as the results would not change management plan for presumptive NS.  He was discharged to rehab and completed his 14 day course of Penicillin G and received final dose of Bicilin on 01/10/23.  Please see A&P for the details of today's visit and status of the patient's medical problems.   Patient's Medications  New Prescriptions   No medications on file  Previous Medications   ACETAMINOPHEN (TYLENOL) 325 MG TABLET    Take 1-2 tablets (325-650 mg total) by mouth every 4 (four) hours as needed for mild pain.   ALBUTEROL (VENTOLIN HFA) 108 (90 BASE) MCG/ACT INHALER    Inhale 2 puffs into the lungs every 6 (six) hours as needed for wheezing or shortness of breath.   ASPIRIN EC 81 MG TABLET    Take 1 tablet (81 mg total) by mouth daily. Swallow whole.   ATORVASTATIN (LIPITOR) 40 MG TABLET    Take 1 tablet (40 mg total) by mouth daily.   CARVEDILOL (COREG) 12.5 MG TABLET    Take 1 tablet (12.5 mg total) by mouth 2 (two) times daily with a meal.   CLOPIDOGREL (PLAVIX) 75 MG TABLET    Take 1 tablet (75 mg total) by mouth daily.   EMPAGLIFLOZIN (JARDIANCE) 10 MG TABS TABLET    Take 1 tablet (10 mg total) by mouth daily.   NICOTINE POLACRILEX (NICORETTE) 2 MG GUM    Take 1 each (2 mg total) by mouth as needed for smoking cessation.   SACUBITRIL-VALSARTAN (ENTRESTO) 24-26 MG    Take 1 tablet by mouth 2 (two) times daily.   SPIRONOLACTONE (ALDACTONE) 25 MG TABLET    Take 0.5 tablets  (12.5 mg total) by mouth daily.   TRAZODONE (DESYREL) 50 MG TABLET    Take 0.5 tablets (25 mg total) by mouth at bedtime as needed for sleep.  Modified Medications   No medications on file  Discontinued Medications   No medications on file      Past Medical History:  Diagnosis Date   Cocaine abuse    Drug overdose    Marijuana abuse    Suicidal ideation     Social History   Tobacco Use   Smoking status: Every Day    Packs/day: 0.50    Years: 12.00    Additional pack years: 0.00    Total pack years: 6.00    Types: Cigarettes  Vaping Use   Vaping Use: Former   Substances: Flavoring  Substance Use Topics   Alcohol use: Not Currently    Alcohol/week: 24.0 standard drinks of alcohol    Types: 24 Cans of beer per week   Drug use: Yes    Types: Cocaine, Marijuana    Comment: 2days before admission    Family History  Problem Relation Age of Onset   Hyperlipidemia Mother    Hypertension Mother    Heart failure Father  No Known Allergies  ROS   OBJECTIVE:    There were no vitals filed for this visit. There is no height or weight on file to calculate BMI.  Physical Exam   Labs and Microbiology:    Latest Ref Rng & Units 01/27/2023   12:00 AM 01/26/2023   11:52 PM 01/08/2023    6:25 AM  CBC  WBC 4.0 - 10.5 K/uL  6.6  11.5   Hemoglobin 13.0 - 17.0 g/dL 16.1  09.6  04.5   Hematocrit 39.0 - 52.0 % 38.0  37.0  38.0   Platelets 150 - 400 K/uL  241  288       Latest Ref Rng & Units 01/27/2023   12:00 AM 01/26/2023   11:52 PM 01/10/2023    7:15 AM  CMP  Glucose 70 - 99 mg/dL 409  811  914   BUN 6 - 20 mg/dL Creatinine 0.61 - 1.24 mg/dL 7.82  9.56  2.13   Sodium 135 - 145 mmol/L 143  141  137   Potassium 3.5 - 5.1 mmol/L 3.3  3.3  4.2   Chloride 98 - 111 mmol/L 106  110  100   CO2 22 - 32 mmol/L  25  24   Calcium 8.9 - 10.3 mg/dL  8.4  9.1   Total Protein 6.5 - 8.1 g/dL  5.8    Total Bilirubin 0.3 - 1.2 mg/dL  0.6    Alkaline Phos 38 - 126 U/L   46    AST 15 - 41 U/L  27    ALT 0 - 44 U/L  57       No results found for this or any previous visit (from the past 240 hour(s)).  Imaging: ***   ASSESSMENT & PLAN:    No problem-specific Assessment & Plan notes found for this encounter.   No orders of the defined types were placed in this encounter.    There are no diagnoses linked to this encounter.  Discussed with patient diagnosis of neurosyphilis and that he has completed treatment.  Will plan for repeat RPR monitoring and follow up in 4 months.  Also discussed HIV prevention through PrEP and he ***.    Vedia Coffer for Infectious Disease [facility] 02/17/2023, 4:56 AM

## 2023-03-30 NOTE — Progress Notes (Unsigned)
  Subjective:    Jeremy Fritz - 42 y.o. male MRN [id number]  Date of birth: 1981/01/12  HPI  Jeremy Fritz is to establish care.  Current issues and/or concerns:  ROS per HPI     Health Maintenance:  Health Maintenance Due  Topic Date Due   COVID-19 Vaccine (1) Never done     Past Medical History: Patient Active Problem List   Diagnosis Date Noted   Acute systolic heart failure (HCC) 01/09/2023   Impaired fasting glucose 01/06/2023   Polysubstance abuse (HCC) 01/02/2023   Neurosyphilis in adult 12/29/2022   Acute ischemic right MCA stroke (HCC) 12/26/2022   Acute CVA (cerebrovascular accident) (HCC) 12/25/2022   HTN (hypertension) 12/25/2022   Continuous tobacco abuse 12/25/2022   Cocaine abuse (HCC) 12/09/2011    Class: Chronic      Social History   reports that he has been smoking cigarettes. He has a 6.00 pack-year smoking history. He does not have any smokeless tobacco history on file. He reports that he does not currently use alcohol after a past usage of about 24.0 standard drinks of alcohol per week. He reports current drug use. Drugs: Cocaine and Marijuana.   Family History  family history includes Heart failure in his father; Hyperlipidemia in his mother; Hypertension in his mother.   Medications: reviewed and updated   Objective:   Physical Exam There were no vitals taken for this visit. Physical Exam      Assessment & Plan:         Patient was given clear instructions to go to Emergency Department or return to medical center if symptoms don't improve, worsen, or new problems develop.The patient verbalized understanding.  I discussed the assessment and treatment plan with the patient. The patient was provided an opportunity to ask questions and all were answered. The patient agreed with the plan and demonstrated an understanding of the instructions.   The patient was advised to call back or seek an in-person evaluation if the symptoms  worsen or if the condition fails to improve as anticipated.    Ricky Stabs, NP 03/30/2023, 12:39 PM Primary Care at [facility]

## 2023-03-31 ENCOUNTER — Encounter: Payer: Commercial Managed Care - HMO | Admitting: Family

## 2023-03-31 DIAGNOSIS — Z7689 Persons encountering health services in other specified circumstances: Secondary | ICD-10-CM

## 2023-04-01 ENCOUNTER — Encounter: Payer: Self-pay | Admitting: *Deleted

## 2023-04-01 NOTE — Progress Notes (Signed)
Pt attended 11/11/03/21 screening event where b/p was 171/111. For the first event f/u on 09/29/22, the health equity team was unable to contact pt by phone and sent letter asking pt to f/u elevated b/p urgently and included PCP access through [facility] to assist him. Per chart review, pt was seen in ED on 10/09/22, where b/p was 153/103. During second event f/u on 11/26/21,  caller unable to contact pt and letter was sent with PCP contact info, [facility] support, and an SDOH questionnaire in case pt was experiencing SDOH barriers to accessing healthcare. There is no [facility] documentation indicating pt returned the SDOH questionnaire or established with a PCP for whom documentation is visible in [facility]. Chart review indicates pt was admitted for a stroke, tx, PT/OT,and cardiac services. Per final event f/u chart review, pt did not keep cardiology, Infectious disease or Physical Medicine/Rehab post hospital f/u d/c appts. Final letter sent to pt with [facility] flyers to establish care with a PCP as well as phone numbers for all the specialists with whom pt missed an appt after his stroke. No additional health equity team support is scheduled at this time.

## 2024-05-07 ENCOUNTER — Emergency Department (HOSPITAL_COMMUNITY)

## 2024-05-07 ENCOUNTER — Inpatient Hospital Stay (HOSPITAL_COMMUNITY)
Admission: EM | Admit: 2024-05-07 | Discharge: 2024-05-11 | DRG: 871 | Disposition: A | Discharge status: Home / Self Care | Attending: Family Medicine | Admitting: Family Medicine

## 2024-05-07 ENCOUNTER — Encounter (HOSPITAL_COMMUNITY): Payer: Self-pay | Admitting: Student

## 2024-05-07 ENCOUNTER — Other Ambulatory Visit: Payer: Self-pay

## 2024-05-07 DIAGNOSIS — Z8249 Family history of ischemic heart disease and other diseases of the circulatory system: Secondary | ICD-10-CM

## 2024-05-07 DIAGNOSIS — Z5986 Financial insecurity: Secondary | ICD-10-CM

## 2024-05-07 DIAGNOSIS — I110 Hypertensive heart disease with heart failure: Secondary | ICD-10-CM | POA: Diagnosis present

## 2024-05-07 DIAGNOSIS — F17210 Nicotine dependence, cigarettes, uncomplicated: Secondary | ICD-10-CM | POA: Diagnosis present

## 2024-05-07 DIAGNOSIS — Z1152 Encounter for screening for COVID-19: Secondary | ICD-10-CM

## 2024-05-07 DIAGNOSIS — R5381 Other malaise: Secondary | ICD-10-CM | POA: Diagnosis present

## 2024-05-07 DIAGNOSIS — G4733 Obstructive sleep apnea (adult) (pediatric): Secondary | ICD-10-CM | POA: Diagnosis present

## 2024-05-07 DIAGNOSIS — F1410 Cocaine abuse, uncomplicated: Secondary | ICD-10-CM | POA: Diagnosis present

## 2024-05-07 DIAGNOSIS — E876 Hypokalemia: Secondary | ICD-10-CM | POA: Diagnosis present

## 2024-05-07 DIAGNOSIS — E785 Hyperlipidemia, unspecified: Secondary | ICD-10-CM | POA: Diagnosis present

## 2024-05-07 DIAGNOSIS — I5023 Acute on chronic systolic (congestive) heart failure: Secondary | ICD-10-CM

## 2024-05-07 DIAGNOSIS — R7401 Elevation of levels of liver transaminase levels: Secondary | ICD-10-CM | POA: Diagnosis present

## 2024-05-07 DIAGNOSIS — Z83438 Family history of other disorder of lipoprotein metabolism and other lipidemia: Secondary | ICD-10-CM

## 2024-05-07 DIAGNOSIS — Z7984 Long term (current) use of oral hypoglycemic drugs: Secondary | ICD-10-CM | POA: Diagnosis not present

## 2024-05-07 DIAGNOSIS — A419 Sepsis, unspecified organism: Secondary | ICD-10-CM | POA: Diagnosis present

## 2024-05-07 DIAGNOSIS — I340 Nonrheumatic mitral (valve) insufficiency: Secondary | ICD-10-CM | POA: Diagnosis present

## 2024-05-07 DIAGNOSIS — R45851 Suicidal ideations: Secondary | ICD-10-CM | POA: Diagnosis present

## 2024-05-07 DIAGNOSIS — Z79899 Other long term (current) drug therapy: Secondary | ICD-10-CM | POA: Diagnosis not present

## 2024-05-07 DIAGNOSIS — I700 Atherosclerosis of aorta: Secondary | ICD-10-CM | POA: Diagnosis present

## 2024-05-07 DIAGNOSIS — Z7902 Long term (current) use of antithrombotics/antiplatelets: Secondary | ICD-10-CM | POA: Diagnosis not present

## 2024-05-07 DIAGNOSIS — F1210 Cannabis abuse, uncomplicated: Secondary | ICD-10-CM | POA: Diagnosis present

## 2024-05-07 DIAGNOSIS — Z5900 Homelessness unspecified: Secondary | ICD-10-CM

## 2024-05-07 DIAGNOSIS — F1990 Other psychoactive substance use, unspecified, uncomplicated: Secondary | ICD-10-CM

## 2024-05-07 DIAGNOSIS — Z7982 Long term (current) use of aspirin: Secondary | ICD-10-CM | POA: Diagnosis not present

## 2024-05-07 DIAGNOSIS — J189 Pneumonia, unspecified organism: Secondary | ICD-10-CM | POA: Diagnosis present

## 2024-05-07 DIAGNOSIS — Z5941 Food insecurity: Secondary | ICD-10-CM | POA: Diagnosis not present

## 2024-05-07 DIAGNOSIS — I10 Essential (primary) hypertension: Secondary | ICD-10-CM | POA: Diagnosis not present

## 2024-05-07 DIAGNOSIS — F1490 Cocaine use, unspecified, uncomplicated: Secondary | ICD-10-CM | POA: Diagnosis not present

## 2024-05-07 DIAGNOSIS — Z8673 Personal history of transient ischemic attack (TIA), and cerebral infarction without residual deficits: Secondary | ICD-10-CM

## 2024-05-07 DIAGNOSIS — F1910 Other psychoactive substance abuse, uncomplicated: Secondary | ICD-10-CM | POA: Diagnosis present

## 2024-05-07 DIAGNOSIS — Z91148 Patient's other noncompliance with medication regimen for other reason: Secondary | ICD-10-CM

## 2024-05-07 HISTORY — DX: Unspecified systolic (congestive) heart failure: I50.20

## 2024-05-07 HISTORY — DX: Homelessness unspecified: Z59.00

## 2024-05-07 HISTORY — DX: Patient's other noncompliance with medication regimen for other reason: Z91.148

## 2024-05-07 HISTORY — DX: Other psychoactive substance abuse, uncomplicated: F19.10

## 2024-05-07 HISTORY — DX: Hyperlipidemia, unspecified: E78.5

## 2024-05-07 HISTORY — DX: Essential (primary) hypertension: I10

## 2024-05-07 HISTORY — DX: Tobacco use: Z72.0

## 2024-05-07 LAB — RESP PANEL BY RT-PCR (RSV, FLU A&B, COVID)  RVPGX2
Influenza A by PCR: NEGATIVE
Influenza B by PCR: NEGATIVE
Resp Syncytial Virus by PCR: NEGATIVE
SARS Coronavirus 2 by RT PCR: NEGATIVE

## 2024-05-07 LAB — COMPREHENSIVE METABOLIC PANEL WITH GFR
ALT: 45 U/L — ABNORMAL HIGH (ref 0–44)
AST: 46 U/L — ABNORMAL HIGH (ref 15–41)
Albumin: 2.6 g/dL — ABNORMAL LOW (ref 3.5–5.0)
Alkaline Phosphatase: 76 U/L (ref 38–126)
Anion gap: 9 (ref 5–15)
BUN: 18 mg/dL (ref 6–20)
CO2: 22 mmol/L (ref 22–32)
Calcium: 8 mg/dL — ABNORMAL LOW (ref 8.9–10.3)
Chloride: 105 mmol/L (ref 98–111)
Creatinine, Ser: 1.07 mg/dL (ref 0.61–1.24)
GFR, Estimated: >60 mL/min (ref >60)
Glucose, Bld: 115 mg/dL — ABNORMAL HIGH (ref 70–99)
Potassium: 4.5 mmol/L (ref 3.5–5.1)
Sodium: 136 mmol/L (ref 135–145)
Total Bilirubin: 1.4 mg/dL — ABNORMAL HIGH (ref 0.0–1.2)
Total Protein: 6.3 g/dL — ABNORMAL LOW (ref 6.5–8.1)

## 2024-05-07 LAB — RAPID URINE DRUG SCREEN, HOSP PERFORMED
Amphetamines: NOT DETECTED
Barbiturates: NOT DETECTED
Benzodiazepines: NOT DETECTED
Cocaine: POSITIVE — AB
Opiates: NOT DETECTED
Tetrahydrocannabinol: POSITIVE — AB

## 2024-05-07 LAB — CBC WITH DIFFERENTIAL/PLATELET
Abs Immature Granulocytes: 0.04 K/uL (ref 0.00–0.07)
Basophils Absolute: 0.1 K/uL (ref 0.0–0.1)
Basophils Relative: 1 %
Eosinophils Absolute: 0 K/uL (ref 0.0–0.5)
Eosinophils Relative: 0 %
HCT: 37.5 % — ABNORMAL LOW (ref 39.0–52.0)
Hemoglobin: 12.6 g/dL — ABNORMAL LOW (ref 13.0–17.0)
Immature Granulocytes: 0 %
Lymphocytes Relative: 23 %
Lymphs Abs: 2.6 K/uL (ref 0.7–4.0)
MCH: 30.3 pg (ref 26.0–34.0)
MCHC: 33.6 g/dL (ref 30.0–36.0)
MCV: 90.1 fL (ref 80.0–100.0)
Monocytes Absolute: 0.9 K/uL (ref 0.1–1.0)
Monocytes Relative: 8 %
Neutro Abs: 7.9 K/uL — ABNORMAL HIGH (ref 1.7–7.7)
Neutrophils Relative %: 68 %
Platelets: 405 K/uL — ABNORMAL HIGH (ref 150–400)
RBC: 4.16 MIL/uL — ABNORMAL LOW (ref 4.22–5.81)
RDW: 15.3 % (ref 11.5–15.5)
WBC: 11.5 K/uL — ABNORMAL HIGH (ref 4.0–10.5)
nRBC: 0 % (ref 0.0–0.2)

## 2024-05-07 LAB — PROTIME-INR
INR: 1.1 (ref 0.8–1.2)
Prothrombin Time: 15.1 s (ref 11.4–15.2)

## 2024-05-07 LAB — URINALYSIS, W/ REFLEX TO CULTURE (INFECTION SUSPECTED)
Bacteria, UA: NONE SEEN
Bilirubin Urine: NEGATIVE
Glucose, UA: NEGATIVE mg/dL
Hgb urine dipstick: NEGATIVE
Ketones, ur: NEGATIVE mg/dL
Leukocytes,Ua: NEGATIVE
Nitrite: NEGATIVE
Protein, ur: 30 mg/dL — AB
Specific Gravity, Urine: 1.028 (ref 1.005–1.030)
pH: 5 (ref 5.0–8.0)

## 2024-05-07 LAB — BRAIN NATRIURETIC PEPTIDE: B Natriuretic Peptide: 607.4 pg/mL — ABNORMAL HIGH (ref 0.0–100.0)

## 2024-05-07 LAB — I-STAT CG4 LACTIC ACID, ED: Lactic Acid, Venous: 1.8 mmol/L (ref 0.5–1.9)

## 2024-05-07 LAB — LIPASE, BLOOD: Lipase: 32 U/L (ref 11–51)

## 2024-05-07 MED ORDER — SENNOSIDES-DOCUSATE SODIUM 8.6-50 MG PO TABS: 1.0000 | ORAL_TABLET | Freq: Every evening | ORAL | Status: DC | PRN

## 2024-05-07 MED ORDER — TRAZODONE HCL 50 MG PO TABS
25.0000 mg | ORAL_TABLET | Freq: Every evening | ORAL | Status: DC | PRN
  Administered 2024-05-08: 25 mg via ORAL
  Filled 2024-05-07: qty 1

## 2024-05-07 MED ORDER — MORPHINE SULFATE (PF) 4 MG/ML IV SOLN
4.0000 mg | Freq: Once | INTRAVENOUS | Status: AC
  Administered 2024-05-07: 4 mg via INTRAVENOUS
  Filled 2024-05-07: qty 1

## 2024-05-07 MED ORDER — ACETAMINOPHEN 500 MG PO TABS
1000.0000 mg | ORAL_TABLET | Freq: Once | ORAL | Status: AC
  Administered 2024-05-07: 1000 mg via ORAL
  Filled 2024-05-07: qty 2

## 2024-05-07 MED ORDER — ENOXAPARIN SODIUM 40 MG/0.4ML IJ SOSY
40.0000 mg | PREFILLED_SYRINGE | INTRAMUSCULAR | Status: DC
  Administered 2024-05-09 – 2024-05-10 (×2): 40 mg via SUBCUTANEOUS
  Filled 2024-05-07 (×3): qty 0.4

## 2024-05-07 MED ORDER — ATORVASTATIN CALCIUM 40 MG PO TABS
40.0000 mg | ORAL_TABLET | Freq: Every day | ORAL | Status: DC
  Administered 2024-05-07 – 2024-05-11 (×5): 40 mg via ORAL
  Filled 2024-05-07 (×5): qty 1

## 2024-05-07 MED ORDER — LACTATED RINGERS IV BOLUS (SEPSIS)
1000.0000 mL | Freq: Once | INTRAVENOUS | Status: AC
  Administered 2024-05-07: 1000 mL via INTRAVENOUS

## 2024-05-07 MED ORDER — CARVEDILOL 12.5 MG PO TABS: 12.5000 mg | ORAL_TABLET | Freq: Two times a day (BID) | ORAL | Status: DC

## 2024-05-07 MED ORDER — FUROSEMIDE 10 MG/ML IJ SOLN
40.0000 mg | Freq: Once | INTRAMUSCULAR | Status: AC
  Administered 2024-05-07: 40 mg via INTRAVENOUS
  Filled 2024-05-07: qty 4

## 2024-05-07 MED ORDER — VANCOMYCIN HCL 1500 MG/300ML IV SOLN
1500.0000 mg | Freq: Once | INTRAVENOUS | Status: AC
  Administered 2024-05-07: 1500 mg via INTRAVENOUS
  Filled 2024-05-07: qty 300

## 2024-05-07 MED ORDER — ONDANSETRON HCL 4 MG PO TABS: 4.0000 mg | ORAL_TABLET | Freq: Four times a day (QID) | ORAL | Status: DC | PRN

## 2024-05-07 MED ORDER — ACETAMINOPHEN 325 MG PO TABS
650.0000 mg | ORAL_TABLET | Freq: Four times a day (QID) | ORAL | Status: DC | PRN
  Administered 2024-05-08: 650 mg via ORAL
  Filled 2024-05-07: qty 2

## 2024-05-07 MED ORDER — IOHEXOL 300 MG/ML  SOLN
100.0000 mL | Freq: Once | INTRAMUSCULAR | Status: AC | PRN
  Administered 2024-05-07: 100 mL via INTRAVENOUS

## 2024-05-07 MED ORDER — ACETAMINOPHEN 650 MG RE SUPP: 650.0000 mg | Freq: Four times a day (QID) | RECTAL | Status: DC | PRN

## 2024-05-07 MED ORDER — SODIUM CHLORIDE 0.9 % IV SOLN
500.0000 mg | Freq: Once | INTRAVENOUS | Status: AC
  Administered 2024-05-07: 500 mg via INTRAVENOUS
  Filled 2024-05-07: qty 5

## 2024-05-07 MED ORDER — SACUBITRIL-VALSARTAN 24-26 MG PO TABS
1.0000 | ORAL_TABLET | Freq: Two times a day (BID) | ORAL | Status: DC
  Administered 2024-05-07 – 2024-05-09 (×5): 1 via ORAL
  Filled 2024-05-07 (×6): qty 1

## 2024-05-07 MED ORDER — SPIRONOLACTONE 12.5 MG HALF TABLET: 12.5000 mg | ORAL_TABLET | Freq: Every day | ORAL | Status: DC

## 2024-05-07 MED ORDER — LACTATED RINGERS IV SOLN: INTRAVENOUS | Status: DC

## 2024-05-07 MED ORDER — ONDANSETRON HCL 4 MG/2ML IJ SOLN
4.0000 mg | Freq: Four times a day (QID) | INTRAMUSCULAR | Status: DC | PRN
Start: 2024-05-07 — End: 2024-05-11

## 2024-05-07 MED ORDER — ASPIRIN 81 MG PO TBEC
81.0000 mg | DELAYED_RELEASE_TABLET | Freq: Every day | ORAL | Status: DC
  Administered 2024-05-08 – 2024-05-11 (×4): 81 mg via ORAL
  Filled 2024-05-07 (×4): qty 1

## 2024-05-07 MED ORDER — SPIRONOLACTONE 12.5 MG HALF TABLET
12.5000 mg | ORAL_TABLET | Freq: Every day | ORAL | Status: DC
  Administered 2024-05-07 – 2024-05-10 (×4): 12.5 mg via ORAL
  Filled 2024-05-07 (×4): qty 1

## 2024-05-07 MED ORDER — SODIUM CHLORIDE 0.9 % IV SOLN
2.0000 g | Freq: Once | INTRAVENOUS | Status: AC
  Administered 2024-05-07: 2 g via INTRAVENOUS
  Filled 2024-05-07: qty 20

## 2024-05-07 MED ORDER — LACTATED RINGERS IV BOLUS (SEPSIS)
250.0000 mL | Freq: Once | INTRAVENOUS | Status: AC
  Administered 2024-05-07: 250 mL via INTRAVENOUS

## 2024-05-07 NOTE — Progress Notes (Signed)
 ED Pharmacy Antibiotic Sign Off An antibiotic consult was received from an ED provider for vancomycin  per pharmacy dosing for sepsis. A chart review was completed to assess appropriateness.   The following one time order(s) were placed:  Vancomycin  1500mg  IV x1  Further antibiotic and/or antibiotic pharmacy consults should be ordered by the admitting provider if indicated.   Thank you for allowing pharmacy to be a part of this patient's care.   Lacinda Moats, PharmD Clinical Pharmacist  7/12/20254:15 PM
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
## 2024-05-07 NOTE — ED Triage Notes (Signed)
 BIBA for cough, malaise, BLE edema x 4 days, fever, blood tinged sputum starting this AM. Pt has hx of CVA, left side residual weakness. Pt also states that he uses crack regularly and has used today. Pt is homeless and has not been able to keep up with his medications. 101.5 T 112 HR 158/102 BP 24 RR

## 2024-05-07 NOTE — Progress Notes (Signed)
 Elink is following code sepsis.

## 2024-05-07 NOTE — ED Provider Notes (Signed)
 [place] EMERGENCY DEPARTMENT AT [facility] Provider Note   CSN: [id number] Arrival date & time: 05/07/24  1524     Patient presents with: Cough and Leg Swelling   Jeremy Fritz is a 43 y.o. male with past medical history significant for hypertension, tobacco abuse, crack cocaine abuse who presents with concern for cough, malaise, bilateral lower extremity edema, fever, blood-tinged sputum starting this morning.  His had worsening cough.  Reports currently homeless not able to keep up with his medications.  He also endorses some upper abdominal pain.  Denies any nausea, vomiting, diarrhea.    Cough      Prior to Admission medications   Medication Sig Start Date End Date Taking? Authorizing Provider  acetaminophen  (TYLENOL ) 325 MG tablet Take 1-2 tablets (325-650 mg total) by mouth every 4 (four) hours as needed for mild pain. 01/08/23   Love, Sharlet RAMAN, PA-C  albuterol  (VENTOLIN  HFA) 108 (90 Base) MCG/ACT inhaler Inhale 2 puffs into the lungs every 6 (six) hours as needed for wheezing or shortness of breath.    [provider]  aspirin  EC 81 MG tablet Take 1 tablet (81 mg total) by mouth daily. Swallow whole. 01/08/23   Love, Sharlet RAMAN, PA-C  atorvastatin  (LIPITOR) 40 MG tablet Take 1 tablet (40 mg total) by mouth daily. 01/08/23   Love, Sharlet RAMAN, PA-C  carvedilol  (COREG ) 12.5 MG tablet Take 1 tablet (12.5 mg total) by mouth 2 (two) times daily with a meal. 01/08/23   Love, Sharlet RAMAN, PA-C  clopidogrel  (PLAVIX ) 75 MG tablet Take 1 tablet (75 mg total) by mouth daily. 01/08/23   Love, Sharlet RAMAN, PA-C  empagliflozin  (JARDIANCE ) 10 MG TABS tablet Take 1 tablet (10 mg total) by mouth daily. 01/08/23   Love, Sharlet RAMAN, PA-C  nicotine  polacrilex (NICORETTE ) 2 MG gum Take 1 each (2 mg total) by mouth as needed for smoking cessation. 01/09/23   Love, Sharlet RAMAN, PA-C  sacubitril -valsartan  (ENTRESTO ) 24-26 MG Take 1 tablet by mouth 2 (two) times daily. 01/08/23   Love, Sharlet RAMAN, PA-C   spironolactone  (ALDACTONE ) 25 MG tablet Take 0.5 tablets (12.5 mg total) by mouth daily. 01/09/23   Love, Sharlet RAMAN, PA-C  traZODone  (DESYREL ) 50 MG tablet Take 0.5 tablets (25 mg total) by mouth at bedtime as needed for sleep. 01/08/23   Maurice Sharlet RAMAN, PA-C    Allergies: Patient has no known allergies.    Review of Systems  Respiratory:  Positive for cough.   All other systems reviewed and are negative.   Updated Vital Signs BP 107/73 (BP Location: Right Arm)   Pulse 85   Temp 98.3 F (36.8 C) (Oral)   Resp 19   Ht 6' (1.829 m)   Wt 69 kg   SpO2 100%   BMI 20.63 kg/m   Physical Exam Vitals and nursing note reviewed.  Constitutional:      General: He is not in acute distress.    Appearance: Normal appearance.  HENT:     Head: Normocephalic and atraumatic.  Eyes:     General:        Right eye: No discharge.        Left eye: No discharge.  Cardiovascular:     Rate and Rhythm: Regular rhythm. Tachycardia present.     Heart sounds: No murmur heard.    No friction rub. No gallop.  Pulmonary:     Effort: Pulmonary effort is normal.     Breath sounds: Normal breath  sounds.     Comments: Diffuse rhonchi, significant coughing throughout respiratory exam, shallow breathing Abdominal:     General: Bowel sounds are normal.     Palpations: Abdomen is soft.     Comments: Mild tenderness to palpation epigastric region, no rebound, rigidity, guarding  Skin:    General: Skin is warm and dry.     Capillary Refill: Capillary refill takes less than 2 seconds.  Neurological:     Mental Status: He is alert and oriented to person, place, and time.  Psychiatric:        Mood and Affect: Mood normal.        Behavior: Behavior normal.     (all labs ordered are listed, but only abnormal results are displayed) Labs Reviewed  COMPREHENSIVE METABOLIC PANEL WITH GFR - Abnormal; Notable for the following components:      Result Value   Glucose, Bld 115 (*)    Calcium  8.0 (*)    Total  Protein 6.3 (*)    Albumin 2.6 (*)    AST 46 (*)    ALT 45 (*)    Total Bilirubin 1.4 (*)    All other components within normal limits  CBC WITH DIFFERENTIAL/PLATELET - Abnormal; Notable for the following components:   WBC 11.5 (*)    RBC 4.16 (*)    Hemoglobin 12.6 (*)    HCT 37.5 (*)    Platelets 405 (*)    Neutro Abs 7.9 (*)    All other components within normal limits  URINALYSIS, W/ REFLEX TO CULTURE (INFECTION SUSPECTED) - Abnormal; Notable for the following components:   Color, Urine AMBER (*)    Protein, ur 30 (*)    All other components within normal limits  RAPID URINE DRUG SCREEN, HOSP PERFORMED - Abnormal; Notable for the following components:   Cocaine POSITIVE (*)    Tetrahydrocannabinol POSITIVE (*)    All other components within normal limits  RESP PANEL BY RT-PCR (RSV, FLU A&B, COVID)  RVPGX2  CULTURE, BLOOD (ROUTINE X 2)  CULTURE, BLOOD (ROUTINE X 2)  PROTIME-INR  LIPASE, BLOOD  BRAIN NATRIURETIC PEPTIDE  I-STAT CG4 LACTIC ACID, ED  I-STAT CG4 LACTIC ACID, ED    EKG: EKG Interpretation Date/Time:  Saturday May 07 2024 15:34:07 EDT Ventricular Rate:  119 PR Interval:  139 QRS Duration:  103 QT Interval:  355 QTC Calculation: 500 R Axis:   122  Text Interpretation: Sinus tachycardia Probable left atrial enlargement S1,S2,S3 pattern Consider left ventricular hypertrophy Anterior Q waves, possibly due to LVH Nonspecific T abnormalities, lateral leads Confirmed by Franklyn Gills [phone]) on 05/07/2024 3:50:19 PM  Radiology: CT ABDOMEN PELVIS W CONTRAST Result Date: 05/07/2024 CLINICAL DATA:  Sepsis. EXAM: CT ABDOMEN AND PELVIS WITH CONTRAST TECHNIQUE: Multidetector CT imaging of the abdomen and pelvis was performed using the standard protocol following bolus administration of intravenous contrast. RADIATION DOSE REDUCTION: This exam was performed according to the departmental dose-optimization program which includes automated exposure control, adjustment of  the mA and/or kV according to patient size and/or use of iterative reconstruction technique. CONTRAST:  OMNIPAQUE  IOHEXOL  300 MG/ML  SOLN COMPARISON:  No prior abdominal imaging. Chest radiograph earlier today reviewed FINDINGS: Lower chest: The heart is enlarged. Small bilateral pleural effusions, right greater than left. Multifocal consolidative and nodular opacities in both lungs, greatest in the right lower lobe. Hepatobiliary: Prominent periportal edema. There is no evidence of focal liver lesion. Diffuse gallbladder wall thickening. No calcified gallstone. Biliary tree is poorly defined  on the current exam. Pancreas: No ductal dilatation or inflammation. Spleen: 7 mm low-density in the inferior spleen, too small to characterize. Adrenals/Urinary Tract: Normal adrenal glands. No hydronephrosis. Mild heterogeneous enhancement involving the left kidney, series 12, image 33. Early excretion of IV contrast in the renal collecting systems. Bilateral renal cysts. No further follow-up imaging is recommended. Urinary bladder is completely decompressed and not well assessed. Stomach/Bowel: Bowel assessment is limited in the absence of enteric contrast and paucity of intra-abdominal fat. No bowel obstruction or abnormal distention. There is no obvious bowel wall thickening. Small volume of formed stool in the colon. The appendix is not definitively visualized, regardless, no evidence of appendicitis. Vascular/Lymphatic: Mild aortic atherosclerosis. No aneurysm. The IVC is prominent in diameter. Portal vein is patent. No bulky adenopathy. Reproductive: Prostate is unremarkable. Other: Generalized edema of the subcutaneous and intra-abdominal fat. No definite ascites. No free air. Musculoskeletal: There are no acute or suspicious osseous abnormalities. IMPRESSION: 1. Multifocal consolidative and nodular opacities in both lung basess, suspicious for multifocal pneumonia. 2. Small bilateral pleural effusions, right  greater than left. 3. Cardiomegaly. 4. Prominent periportal edema and diffuse gallbladder wall thickening, likely related to volume overload. 5. Mild heterogeneous enhancement involving the left kidney, can be seen with pyelonephritis. Recommend correlation with urinalysis. 6. Generalized edema of the subcutaneous and intra-abdominal fat suggestive of third spacing. Aortic Atherosclerosis (ICD10-I70.0). Electronically Signed   By: Andrea Gasman M.D.   On: 05/07/2024 18:09   DG Chest Port 1 View Result Date: 05/07/2024 CLINICAL DATA:  Questionable sepsis - evaluate for abnormality EXAM: PORTABLE CHEST 1 VIEW COMPARISON:  January 08, 2023 FINDINGS: The cardiomediastinal silhouette is enlarged in contour. No pleural effusion. No pneumothorax. Bilateral perihilar predominant reticulonodular airspace opacities. Mild peribronchial cuffing. Mild interstitial prominence. IMPRESSION: Bilateral perihilar predominant reticulonodular airspace opacities with peribronchial cuffing. Findings are favored to reflect pulmonary edema versus atypical infection. Electronically Signed   By: Corean Salter M.D.   On: 05/07/2024 16:50     .Critical Care  Performed by: Rosan Sherlean DEL, PA-C Authorized by: Rosan Sherlean DEL, PA-C   Critical care provider statement:    Critical care time (minutes):  35   Critical care was necessary to treat or prevent imminent or life-threatening deterioration of the following conditions:  Sepsis   Critical care was time spent personally by me on the following activities:  Development of treatment plan with patient or surrogate, discussions with consultants, evaluation of patient's response to treatment, examination of patient, ordering and review of laboratory studies, ordering and review of radiographic studies, ordering and performing treatments and interventions, pulse oximetry, re-evaluation of patient's condition and review of old charts   Care discussed with: admitting  provider      Medications Ordered in the ED  lactated ringers  infusion ( Intravenous New Bag/Given 05/07/24 1557)  lactated ringers  bolus 1,000 mL (0 mLs Intravenous Stopped 05/07/24 1626)    And  lactated ringers  bolus 1,000 mL (0 mLs Intravenous Stopped 05/07/24 1626)    And  lactated ringers  bolus 250 mL (0 mLs Intravenous Stopped 05/07/24 1632)  cefTRIAXone  (ROCEPHIN ) 2 g in sodium chloride  0.9 % 100 mL IVPB (0 g Intravenous Stopped 05/07/24 1634)  azithromycin  (ZITHROMAX ) 500 mg in sodium chloride  0.9 % 250 mL IVPB (0 mg Intravenous Stopped 05/07/24 1729)  acetaminophen  (TYLENOL ) tablet 1,000 mg (1,000 mg Oral Given 05/07/24 1557)  vancomycin  (VANCOREADY) IVPB 1500 mg/300 mL (1,500 mg Intravenous New Bag/Given 05/07/24 1644)  iohexol  (OMNIPAQUE ) 300 MG/ML solution 100  mL (100 mLs Intravenous Contrast Given 05/07/24 1741)  morphine  (PF) 4 MG/ML injection 4 mg (4 mg Intravenous Given 05/07/24 1734)                                    Medical Decision Making  This patient is a 43 y.o. male who presents to the ED for concern of sepsis, shob, this involves an extensive number of treatment options, and is a complaint that carries with it a high risk of complications and morbidity. The emergent differential diagnosis prior to evaluation includes, but is not limited to,  asthma exacerbation, COPD exacerbation, acute upper respiratory infection, acute bronchitis, chronic bronchitis, interstitial lung disease, ARDS, PE, pneumonia, atypical ACS, carbon monoxide poisoning, spontaneous pneumothorax, new CHF vs CHF exacerbation, versus other . This is not an exhaustive differential.   Past Medical History / Co-morbidities / Social History: hypertension, tobacco abuse, crack cocaine abuse  Additional history: Chart reviewed. Pertinent results include: Past medical history significant for polysubstance abuse, previous right MCA stroke, reviewed lab work, imaging from recent previous  hospitalizations.  Physical Exam: Physical exam performed. The pertinent findings include: Tachycardic, tachypneic, coarse rhonchi throughout.  Lab Tests: I ordered, and personally interpreted labs.  The pertinent results include: CBC notable for leukocytosis, blood cells 11.5, mild anemia, hemoglobin 12.6.  Elevated platelets of 405 likely secondary to acute phase reaction, CMP notable for low total protein, albumin which may be one of the reasons that he is having some peripheral extremity edema, elevated AST, ALT, total bilirubin may be related to his drug use.  His UA is overall unremarkable, RVP negative for COVID, flu, RSV, UDS positive for cocaine, THC.  Normal lactic acid.   Imaging Studies: I ordered  and independently interpreted imaging studies including plain film chest x-ray as well as CT abdomen pelvis with contrast, findings consistent with multifocal pneumonia, small pleural effusions noted.  Some gallbladder wall thickening, as well as kidney enhancement suspicious for possible pyelonephritis, although UA does not suggest pyelonephritis. I agree with the radiologist interpretation.   Cardiac Monitoring:  The patient was maintained on a cardiac monitor.  My attending physician Dr. Franklyn viewed and interpreted the cardiac monitored which showed an underlying rhythm of: sinus tachycardia. I agree with this interpretation.   Medications: I ordered medication including a cc per kilo fluid bolus, antibiotics for sepsis secondary to pneumonia, morphine  for pain.   Consultations Obtained: I requested consultation with the hospitalist, Dr. Lou,  and discussed lab and imaging findings as well as pertinent plan - they recommend: Admission for sepsis   Disposition: After consideration of the diagnostic results and the patients response to treatment, I feel that patient would benefit from admission .   I discussed this case with my attending physician Dr. Franklyn who cosigned this note  including patient's presenting symptoms, physical exam, and planned diagnostics and interventions. Attending physician stated agreement with plan or made changes to plan which were implemented.     Final diagnoses:  None    ED Discharge Orders     None          Rosan Sherlean VEAR DEVONNA 05/07/24 1920    Franklyn Sid SAILOR, MD 05/07/24 [phone]

## 2024-05-08 ENCOUNTER — Encounter (HOSPITAL_COMMUNITY): Payer: Self-pay | Admitting: Student

## 2024-05-08 ENCOUNTER — Inpatient Hospital Stay (HOSPITAL_COMMUNITY)

## 2024-05-08 DIAGNOSIS — I340 Nonrheumatic mitral (valve) insufficiency: Secondary | ICD-10-CM | POA: Diagnosis not present

## 2024-05-08 DIAGNOSIS — J189 Pneumonia, unspecified organism: Secondary | ICD-10-CM | POA: Diagnosis not present

## 2024-05-08 DIAGNOSIS — I5023 Acute on chronic systolic (congestive) heart failure: Secondary | ICD-10-CM

## 2024-05-08 DIAGNOSIS — A419 Sepsis, unspecified organism: Secondary | ICD-10-CM | POA: Diagnosis not present

## 2024-05-08 DIAGNOSIS — F1990 Other psychoactive substance use, unspecified, uncomplicated: Secondary | ICD-10-CM

## 2024-05-08 LAB — CBC
HCT: 46.2 % (ref 39.0–52.0)
Hemoglobin: 15.3 g/dL (ref 13.0–17.0)
MCH: 29.5 pg (ref 26.0–34.0)
MCHC: 33.1 g/dL (ref 30.0–36.0)
MCV: 89.2 fL (ref 80.0–100.0)
Platelets: 347 K/uL (ref 150–400)
RBC: 5.18 MIL/uL (ref 4.22–5.81)
RDW: 15 % (ref 11.5–15.5)
WBC: 13.8 K/uL — ABNORMAL HIGH (ref 4.0–10.5)
nRBC: 0 % (ref 0.0–0.2)

## 2024-05-08 LAB — BASIC METABOLIC PANEL WITH GFR
Anion gap: 11 (ref 5–15)
BUN: 15 mg/dL (ref 6–20)
CO2: 22 mmol/L (ref 22–32)
Calcium: 7.9 mg/dL — ABNORMAL LOW (ref 8.9–10.3)
Chloride: 104 mmol/L (ref 98–111)
Creatinine, Ser: 1.03 mg/dL (ref 0.61–1.24)
GFR, Estimated: >60 mL/min (ref >60)
Glucose, Bld: 140 mg/dL — ABNORMAL HIGH (ref 70–99)
Potassium: 4.2 mmol/L (ref 3.5–5.1)
Sodium: 137 mmol/L (ref 135–145)

## 2024-05-08 LAB — ECHOCARDIOGRAM COMPLETE
Area-P 1/2: 5.93 cm2
Calc EF: 20 %
Est EF: 20
Height: 72 in
MV M vel: 5.13 m/s
MV Peak grad: 105.3 mmHg
Radius: 0.6 cm
S' Lateral: 6.1 cm
Single Plane A2C EF: 14.4 %
Single Plane A4C EF: 26.2 %
Weight: 2469.15 [oz_av]

## 2024-05-08 LAB — PROCALCITONIN: Procalcitonin: 0.23 ng/mL

## 2024-05-08 LAB — MAGNESIUM: Magnesium: 1.8 mg/dL (ref 1.7–2.4)

## 2024-05-08 LAB — STREP PNEUMONIAE URINARY ANTIGEN: Strep Pneumo Urinary Antigen: NEGATIVE

## 2024-05-08 LAB — HIV ANTIBODY (ROUTINE TESTING W REFLEX): HIV Screen 4th Generation wRfx: NONREACTIVE

## 2024-05-08 MED ORDER — DM-GUAIFENESIN ER 30-600 MG PO TB12
1.0000 | ORAL_TABLET | Freq: Two times a day (BID) | ORAL | Status: DC
  Administered 2024-05-08 – 2024-05-11 (×7): 1 via ORAL
  Filled 2024-05-08 (×7): qty 1

## 2024-05-08 MED ORDER — SODIUM CHLORIDE 0.9 % IV SOLN
1.0000 g | INTRAVENOUS | Status: DC
  Administered 2024-05-08 – 2024-05-10 (×3): 1 g via INTRAVENOUS
  Filled 2024-05-08 (×3): qty 10

## 2024-05-08 MED ORDER — AZITHROMYCIN 250 MG PO TABS
500.0000 mg | ORAL_TABLET | Freq: Every day | ORAL | Status: AC
  Administered 2024-05-08 – 2024-05-11 (×4): 500 mg via ORAL
  Filled 2024-05-08 (×4): qty 2

## 2024-05-08 MED ORDER — LABETALOL HCL 5 MG/ML IV SOLN
10.0000 mg | Freq: Once | INTRAVENOUS | Status: AC
  Administered 2024-05-08: 10 mg via INTRAVENOUS
  Filled 2024-05-08: qty 4

## 2024-05-08 MED ORDER — FUROSEMIDE 10 MG/ML IJ SOLN
40.0000 mg | Freq: Every day | INTRAMUSCULAR | Status: DC
  Administered 2024-05-08 – 2024-05-10 (×3): 40 mg via INTRAVENOUS
  Filled 2024-05-08 (×3): qty 4

## 2024-05-08 MED ORDER — PERFLUTREN LIPID MICROSPHERE
1.0000 mL | INTRAVENOUS | Status: AC | PRN
  Administered 2024-05-08: 2 mL via INTRAVENOUS

## 2024-05-08 NOTE — Plan of Care (Signed)
  Problem: Clinical Measurements: Goal: Cardiovascular complication will be avoided Outcome: Progressing   Problem: Coping: Goal: Level of anxiety will decrease Outcome: Progressing   Problem: Pain Managment: Goal: General experience of comfort will improve and/or be controlled Outcome: Progressing   Problem: Activity: Goal: Ability to tolerate increased activity will improve Outcome: Progressing   Problem: Clinical Measurements: Goal: Ability to maintain a body temperature in the normal range will improve Outcome: Progressing   Problem: Respiratory: Goal: Ability to maintain a clear airway will improve Outcome: Progressing

## 2024-05-08 NOTE — Plan of Care (Signed)

## 2024-05-08 NOTE — Progress Notes (Deleted)
 SATURATION QUALIFICATIONS: (This note is used to comply with regulatory documentation for home oxygen)  Patient Saturations on Room Air at Rest = 95%  Patient Saturations on Room Air while Ambulating = 90%  Patient Saturations on 2 Liters of oxygen while Ambulating = 98%  Please briefly explain why patient needs home oxygen:

## 2024-05-08 NOTE — Progress Notes (Signed)
  Echocardiogram 2D Echocardiogram has been performed.  Tinnie FORBES Gosling RDCS 05/08/2024, 1:47 PM

## 2024-05-08 NOTE — Progress Notes (Signed)
 PROGRESS NOTE    Jeremy Fritz  FMW:[id number] DOB: 03-08-1981 DOA: 05/07/2024 PCP: Patient, No Pcp Per   Brief Narrative:  43 y.o. male with medical history significant for polysubstance use disorder (cocaine and THC), HFrEF, CVA, HTN, tobacco use disorder, HLD, homelessness, noncompliance to medications and follow-up presented with cough, fever and leg swelling.  On presentation, he was febrile 201, tachycardic with WBC of 11.5, BNP of 607, normal kidney function with negative COVID/influenza/RSV PCR.  UA did not show signs of infection.  UDS was positive for cocaine and THC.  Chest x-ray showed airspace opacities concerning for pulmonary edema versus atypical infection.  CTA abdomen/pelvis showed no acute intra-abdominal pathology but showed evidence of multifocal pneumonia.  He was started on IV fluids and antibiotics.  Subsequently IV fluids was discontinued and he was started on IV Lasix .    Assessment & Plan:   Sepsis: Present on admission Multifocal pneumonia - Presented with cough, shortness of breath, fever due to multifocal pneumonia and was febrile, tachycardic with leukocytosis -Continue Rocephin  and Zithromax .  Currently on room air.  IV fluids have been discontinued  Acute on chronic systolic CHF - Last TTE on 12/2022 had shown EF of 25 to 30% with moderate to severe TVR.  Patient is noncompliant to medications and follow-up and has not followed up with cardiologist as an outpatient - Repeat 2D echo.  Continue IV Lasix .  Spironolactone  and Entresto  have been resumed.  Strict input output.  Daily weights.  Fluid restriction.  Telemetry monitoring  Uncontrolled hypertension -monitor.  Antihypertensive regimen as above.  Coreg  on hold due to cocaine use  History of unspecified CVA Hyperlipidemia -Diagnosed with moderate to large right MCA infarct in March 2024 - Discharged home on DAPT with aspirin  and Plavix  x 3 months followed by aspirin  alone - Patient reports he is  noncompliant with his medications - Resumed aspirin  and atorvastatin .  Outpatient follow-up with neurology  Polysubstance use disorder Homelessness Noncompliance - Counseled regarding abstinence from illicit drug use.  Urine drug screen positive for cocaine and THC. -THC has been consulted.  Leukocytosis -Repeat a.m. labs  Elevated LFTs - Repeat a.m. labs     DVT prophylaxis: Lovenox  Code Status: Full Family Communication: None at bedside Disposition Plan: Status is: Inpatient Remains inpatient appropriate because: Of severity of illness    Consultants: None  Procedures: None  Antimicrobials: Rocephin  and Zithromax  from 05/07/2024 onwards   Subjective: Patient seen and examined at bedside.  Does not feel well.  Complains of nausea and abdominal pain.  Also short of breath with exertion.  Poor historian.  Objective: Vitals:   05/08/24 0200 05/08/24 0400 05/08/24 0427 05/08/24 0500  BP:   (!) 151/105   Pulse:   (!) 105   Resp: 15 16 16    Temp:   98.8 F (37.1 C)   TempSrc:      SpO2:   99%   Weight:    70 kg  Height:        Intake/Output Summary (Last 24 hours) at 05/08/2024 0818 Last data filed at 05/08/2024 0735 Gross per 24 hour  Intake 4212.85 ml  Output --  Net 4212.85 ml   Filed Weights   05/07/24 1611 05/08/24 0500  Weight: 69 kg 70 kg    Examination:  General exam: Appears calm and comfortable.  On room air. Respiratory system: Bilateral decreased breath sounds at bases with scattered crackles Cardiovascular system: S1 & S2 heard, tachycardic  gastrointestinal system: Abdomen is nondistended, soft  and nontender. Normal bowel sounds heard. Extremities: No cyanosis, clubbing; lower extremity edema present Central nervous system: Alert and oriented.  Slow to respond.  Poor historian no focal neurological deficits. Moving extremities Skin: No rashes, lesions or ulcers Psychiatry: Flat affect.  Not agitated    Data Reviewed: I have personally  reviewed following labs and imaging studies  CBC: Recent Labs  Lab 05/07/24 1609  WBC 11.5*  NEUTROABS 7.9*  HGB 12.6*  HCT 37.5*  MCV 90.1  PLT 405*   Basic Metabolic Panel: Recent Labs  Lab 05/07/24 1609  NA 136  K 4.5  CL 105  CO2 22  GLUCOSE 115*  BUN 18  CREATININE 1.07  CALCIUM  8.0*   GFR: Estimated Creatinine Clearance: 88.1 mL/min (by C-G formula based on SCr of 1.07 mg/dL). Liver Function Tests: Recent Labs  Lab 05/07/24 1609  AST 46*  ALT 45*  ALKPHOS 76  BILITOT 1.4*  PROT 6.3*  ALBUMIN 2.6*   Recent Labs  Lab 05/07/24 1609  LIPASE 32   No results for input(s): AMMONIA in the last 168 hours. Coagulation Profile: Recent Labs  Lab 05/07/24 1609  INR 1.1   Cardiac Enzymes: No results for input(s): CKTOTAL, CKMB, CKMBINDEX, TROPONINI in the last 168 hours. BNP (last 3 results) No results for input(s): PROBNP in the last 8760 hours. HbA1C: No results for input(s): HGBA1C in the last 72 hours. CBG: No results for input(s): GLUCAP in the last 168 hours. Lipid Profile: No results for input(s): CHOL, HDL, LDLCALC, TRIG, CHOLHDL, LDLDIRECT in the last 72 hours. Thyroid Function Tests: No results for input(s): TSH, T4TOTAL, FREET4, T3FREE, THYROIDAB in the last 72 hours. Anemia Panel: No results for input(s): VITAMINB12, FOLATE, FERRITIN, TIBC, IRON, RETICCTPCT in the last 72 hours. Sepsis Labs: Recent Labs  Lab 05/07/24 1614  LATICACIDVEN 1.8    Recent Results (from the past 240 hours)  Blood Culture (routine x 2)     Status: None (Preliminary result)   Collection Time: 05/07/24  3:55 PM   Specimen: BLOOD LEFT ARM  Result Value Ref Range Status   Specimen Description   Final    BLOOD LEFT ARM Performed at [facility], [address] [place], [place] [address]    Special Requests   Final    BOTTLES DRAWN AEROBIC AND ANAEROBIC Blood Culture adequate volume Performed at [facility], [address] [place], [place] [address]    Culture PENDING  Incomplete   Report Status PENDING  Incomplete  Resp panel by RT-PCR (RSV, Flu A&B, Covid) Peripheral     Status: None   Collection Time: 05/07/24  4:07 PM   Specimen: Peripheral; Nasal Swab  Result Value Ref Range Status   SARS Coronavirus 2 by RT PCR NEGATIVE NEGATIVE Final    Comment: (NOTE) SARS-CoV-2 target nucleic acids are NOT DETECTED.  The SARS-CoV-2 RNA is generally detectable in upper respiratory specimens during the acute phase of infection. The lowest concentration of SARS-CoV-2 viral copies this assay can detect is 138 copies/mL. A negative result does not preclude SARS-Cov-2 infection and should not be used as the sole basis for treatment or other patient management decisions. A negative result may occur with  improper specimen collection/handling, submission of specimen other than nasopharyngeal swab, presence of viral mutation(s) within the areas targeted by this assay, and inadequate number of viral copies(<138 copies/mL). A negative result must be combined with clinical observations, patient history, and epidemiological information. The expected result is Negative.  Fact Sheet for Patients:  BloggerCourse.com  Fact Sheet for Healthcare Providers:  SeriousBroker.it  This test is no t yet approved or cleared by the [place]  FDA and  has been authorized for detection and/or diagnosis of SARS-CoV-2 by FDA under an Emergency Use Authorization (EUA). This EUA will remain  in effect (meaning this test can be used) for the duration of the COVID-19 declaration under Section 564(b)(1) of the Act, 21 U.S.C.section 360bbb-3(b)(1), unless the authorization is terminated  or revoked sooner.       Influenza A by PCR NEGATIVE NEGATIVE Final   Influenza B by PCR NEGATIVE NEGATIVE Final    Comment: (NOTE) The Xpert Xpress  SARS-CoV-2/FLU/RSV plus assay is intended as an aid in the diagnosis of influenza from Nasopharyngeal swab specimens and should not be used as a sole basis for treatment. Nasal washings and aspirates are unacceptable for Xpert Xpress SARS-CoV-2/FLU/RSV testing.  Fact Sheet for Patients: BloggerCourse.com  Fact Sheet for Healthcare Providers: SeriousBroker.it  This test is not yet approved or cleared by the [place]  FDA and has been authorized for detection and/or diagnosis of SARS-CoV-2 by FDA under an Emergency Use Authorization (EUA). This EUA will remain in effect (meaning this test can be used) for the duration of the COVID-19 declaration under Section 564(b)(1) of the Act, 21 U.S.C. section 360bbb-3(b)(1), unless the authorization is terminated or revoked.     Resp Syncytial Virus by PCR NEGATIVE NEGATIVE Final    Comment: (NOTE) Fact Sheet for Patients: BloggerCourse.com  Fact Sheet for Healthcare Providers: SeriousBroker.it  This test is not yet approved or cleared by the [place]  FDA and has been authorized for detection and/or diagnosis of SARS-CoV-2 by FDA under an Emergency Use Authorization (EUA). This EUA will remain in effect (meaning this test can be used) for the duration of the COVID-19 declaration under Section 564(b)(1) of the Act, 21 U.S.C. section 360bbb-3(b)(1), unless the authorization is terminated or revoked.  Performed at [facility], [address] [place], [place] [address]          Radiology Studies: CT ABDOMEN PELVIS W CONTRAST Result Date: 05/07/2024 CLINICAL DATA:  Sepsis. EXAM: CT ABDOMEN AND PELVIS WITH CONTRAST TECHNIQUE: Multidetector CT imaging of the abdomen and pelvis was performed using the standard protocol following bolus administration of intravenous contrast. RADIATION DOSE REDUCTION: This exam was  performed according to the departmental dose-optimization program which includes automated exposure control, adjustment of the mA and/or kV according to patient size and/or use of iterative reconstruction technique. CONTRAST:  OMNIPAQUE  IOHEXOL  300 MG/ML  SOLN COMPARISON:  No prior abdominal imaging. Chest radiograph earlier today reviewed FINDINGS: Lower chest: The heart is enlarged. Small bilateral pleural effusions, right greater than left. Multifocal consolidative and nodular opacities in both lungs, greatest in the right lower lobe. Hepatobiliary: Prominent periportal edema. There is no evidence of focal liver lesion. Diffuse gallbladder wall thickening. No calcified gallstone. Biliary tree is poorly defined on the current exam. Pancreas: No ductal dilatation or inflammation. Spleen: 7 mm low-density in the inferior spleen, too small to characterize. Adrenals/Urinary Tract: Normal adrenal glands. No hydronephrosis. Mild heterogeneous enhancement involving the left kidney, series 12, image 33. Early excretion of IV contrast in the renal collecting systems. Bilateral renal cysts. No further follow-up imaging is recommended. Urinary bladder is completely decompressed and not well assessed. Stomach/Bowel: Bowel assessment is limited in the absence of enteric contrast and paucity of intra-abdominal fat. No bowel obstruction or abnormal distention. There is no  obvious bowel wall thickening. Small volume of formed stool in the colon. The appendix is not definitively visualized, regardless, no evidence of appendicitis. Vascular/Lymphatic: Mild aortic atherosclerosis. No aneurysm. The IVC is prominent in diameter. Portal vein is patent. No bulky adenopathy. Reproductive: Prostate is unremarkable. Other: Generalized edema of the subcutaneous and intra-abdominal fat. No definite ascites. No free air. Musculoskeletal: There are no acute or suspicious osseous abnormalities. IMPRESSION: 1. Multifocal consolidative and  nodular opacities in both lung basess, suspicious for multifocal pneumonia. 2. Small bilateral pleural effusions, right greater than left. 3. Cardiomegaly. 4. Prominent periportal edema and diffuse gallbladder wall thickening, likely related to volume overload. 5. Mild heterogeneous enhancement involving the left kidney, can be seen with pyelonephritis. Recommend correlation with urinalysis. 6. Generalized edema of the subcutaneous and intra-abdominal fat suggestive of third spacing. Aortic Atherosclerosis (ICD10-I70.0). Electronically Signed   By: Andrea Gasman M.D.   On: 05/07/2024 18:09   DG Chest Port 1 View Result Date: 05/07/2024 CLINICAL DATA:  Questionable sepsis - evaluate for abnormality EXAM: PORTABLE CHEST 1 VIEW COMPARISON:  January 08, 2023 FINDINGS: The cardiomediastinal silhouette is enlarged in contour. No pleural effusion. No pneumothorax. Bilateral perihilar predominant reticulonodular airspace opacities. Mild peribronchial cuffing. Mild interstitial prominence. IMPRESSION: Bilateral perihilar predominant reticulonodular airspace opacities with peribronchial cuffing. Findings are favored to reflect pulmonary edema versus atypical infection. Electronically Signed   By: Corean Salter M.D.   On: 05/07/2024 16:50        Scheduled Meds:  aspirin  EC  81 mg Oral Daily   atorvastatin   40 mg Oral Daily   azithromycin   500 mg Oral Daily   dextromethorphan -guaiFENesin   1 tablet Oral BID   enoxaparin  (LOVENOX ) injection  40 mg Subcutaneous Q24H   furosemide   40 mg Intravenous Daily   sacubitril -valsartan   1 tablet Oral BID   spironolactone   12.5 mg Oral Daily   Continuous Infusions:  cefTRIAXone  (ROCEPHIN )  IV            Sophie Mao, MD [facility] 05/08/2024, 8:18 AM
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
## 2024-05-09 DIAGNOSIS — Z5900 Homelessness unspecified: Secondary | ICD-10-CM

## 2024-05-09 DIAGNOSIS — F1490 Cocaine use, unspecified, uncomplicated: Secondary | ICD-10-CM

## 2024-05-09 DIAGNOSIS — J189 Pneumonia, unspecified organism: Secondary | ICD-10-CM | POA: Diagnosis not present

## 2024-05-09 DIAGNOSIS — A419 Sepsis, unspecified organism: Secondary | ICD-10-CM | POA: Diagnosis not present

## 2024-05-09 LAB — COMPREHENSIVE METABOLIC PANEL WITH GFR
ALT: 71 U/L — ABNORMAL HIGH (ref 0–44)
AST: 50 U/L — ABNORMAL HIGH (ref 15–41)
Albumin: 1.9 g/dL — ABNORMAL LOW (ref 3.5–5.0)
Alkaline Phosphatase: 71 U/L (ref 38–126)
Anion gap: 8 (ref 5–15)
BUN: 18 mg/dL (ref 6–20)
CO2: 25 mmol/L (ref 22–32)
Calcium: 7.7 mg/dL — ABNORMAL LOW (ref 8.9–10.3)
Chloride: 104 mmol/L (ref 98–111)
Creatinine, Ser: 1.14 mg/dL (ref 0.61–1.24)
GFR, Estimated: >60 mL/min (ref >60)
Glucose, Bld: 167 mg/dL — ABNORMAL HIGH (ref 70–99)
Potassium: 3.3 mmol/L — ABNORMAL LOW (ref 3.5–5.1)
Sodium: 137 mmol/L (ref 135–145)
Total Bilirubin: 0.6 mg/dL (ref 0.0–1.2)
Total Protein: 5 g/dL — ABNORMAL LOW (ref 6.5–8.1)

## 2024-05-09 LAB — CBC WITH DIFFERENTIAL/PLATELET
Abs Immature Granulocytes: 0.05 K/uL (ref 0.00–0.07)
Basophils Absolute: 0.1 K/uL (ref 0.0–0.1)
Basophils Relative: 1 %
Eosinophils Absolute: 0.1 K/uL (ref 0.0–0.5)
Eosinophils Relative: 1 %
HCT: 41.4 % (ref 39.0–52.0)
Hemoglobin: 13.9 g/dL (ref 13.0–17.0)
Immature Granulocytes: 1 %
Lymphocytes Relative: 17 %
Lymphs Abs: 1.9 K/uL (ref 0.7–4.0)
MCH: 30.1 pg (ref 26.0–34.0)
MCHC: 33.6 g/dL (ref 30.0–36.0)
MCV: 89.6 fL (ref 80.0–100.0)
Monocytes Absolute: 0.8 K/uL (ref 0.1–1.0)
Monocytes Relative: 7 %
Neutro Abs: 8.1 K/uL — ABNORMAL HIGH (ref 1.7–7.7)
Neutrophils Relative %: 73 %
Platelets: 337 K/uL (ref 150–400)
RBC: 4.62 MIL/uL (ref 4.22–5.81)
RDW: 15 % (ref 11.5–15.5)
WBC: 11 K/uL — ABNORMAL HIGH (ref 4.0–10.5)
nRBC: 0 % (ref 0.0–0.2)

## 2024-05-09 LAB — LEGIONELLA PNEUMOPHILA SEROGP 1 UR AG: L. pneumophila Serogp 1 Ur Ag: NEGATIVE

## 2024-05-09 LAB — MAGNESIUM: Magnesium: 1.8 mg/dL (ref 1.7–2.4)

## 2024-05-09 MED ORDER — POTASSIUM CHLORIDE CRYS ER 20 MEQ PO TBCR
40.0000 meq | EXTENDED_RELEASE_TABLET | Freq: Once | ORAL | Status: AC
  Administered 2024-05-09: 40 meq via ORAL
  Filled 2024-05-09: qty 2

## 2024-05-09 MED ORDER — PANTOPRAZOLE SODIUM 40 MG PO TBEC
40.0000 mg | DELAYED_RELEASE_TABLET | Freq: Every day | ORAL | Status: DC
  Administered 2024-05-09 – 2024-05-11 (×3): 40 mg via ORAL
  Filled 2024-05-09 (×3): qty 1

## 2024-05-09 MED ORDER — ALUM & MAG HYDROXIDE-SIMETH 200-200-20 MG/5ML PO SUSP: 30.0000 mL | ORAL | Status: DC | PRN

## 2024-05-09 MED ORDER — DAPAGLIFLOZIN PROPANEDIOL 10 MG PO TABS
10.0000 mg | ORAL_TABLET | Freq: Every day | ORAL | Status: DC
  Administered 2024-05-09 – 2024-05-11 (×3): 10 mg via ORAL
  Filled 2024-05-09 (×4): qty 1

## 2024-05-09 NOTE — Progress Notes (Signed)
 PROGRESS NOTE    Jeremy Fritz  FMW:[id number] DOB: 12-25-1980 DOA: 05/07/2024 PCP: Patient, No Pcp Per   Brief Narrative:  43 y.o. male with medical history significant for polysubstance use disorder (cocaine and THC), HFrEF, CVA, HTN, tobacco use disorder, HLD, homelessness, noncompliance to medications and follow-up presented with cough, fever and leg swelling.  On presentation, he was febrile 201, tachycardic with WBC of 11.5, BNP of 607, normal kidney function with negative COVID/influenza/RSV PCR.  UA did not show signs of infection.  UDS was positive for cocaine and THC.  Chest x-ray showed airspace opacities concerning for pulmonary edema versus atypical infection.  CTA abdomen/pelvis showed no acute intra-abdominal pathology but showed evidence of multifocal pneumonia.  He was started on IV fluids and antibiotics.  Subsequently IV fluids was discontinued and he was started on IV Lasix .    Assessment & Plan:   Sepsis: Present on admission Multifocal pneumonia - Presented with cough, shortness of breath, fever due to multifocal pneumonia and was febrile, tachycardic with leukocytosis -Continue Rocephin  and Zithromax .  Currently on room air.  IV fluids have been discontinued  Acute on chronic systolic CHF Moderate to severe mitral regurgitation - Last TTE on 12/2022 had shown EF of 25 to 30% with moderate to severe TVR.  Patient is noncompliant to medications and follow-up and has not followed up with cardiologist as an outpatient - Echo from 05/08/2024 showed EF of 20% with moderate to severe mitral regurgitation.  Continue IV Lasix .  Spironolactone  and Entresto  have been resumed.  Strict input output.  Daily weights.  Fluid restriction.  Telemetry monitoring - I will consult cardiology.  Follow recommendations.  Uncontrolled hypertension -monitor.  Antihypertensive regimen as above.  Coreg  on hold due to cocaine use  History of unspecified CVA Hyperlipidemia -Diagnosed with  moderate to large right MCA infarct in March 2024 - Discharged home on DAPT with aspirin  and Plavix  x 3 months followed by aspirin  alone - Patient reports he is noncompliant with his medications - Resumed aspirin  and atorvastatin .  Outpatient follow-up with neurology  Polysubstance use disorder Homelessness Noncompliance - Counseled regarding abstinence from illicit drug use.  Urine drug screen positive for cocaine and THC. -THC has been consulted.  Leukocytosis - Improving.  Repeat a.m. labs  Elevated LFTs - Only mildly elevated.  Repeat a.m. labs     DVT prophylaxis: Lovenox  Code Status: Full Family Communication: None at bedside Disposition Plan: Status is: Inpatient Remains inpatient appropriate because: Of severity of illness    Consultants: None  Procedures: Echo  Antimicrobials: Rocephin  and Zithromax  from 05/07/2024 onwards   Subjective: Patient seen and examined at bedside.  Still short of breath with exertion.  Complains of abdominal pain.  Poor historian.  No fever, agitation or vomiting reported.  Objective: Vitals:   05/08/24 1458 05/08/24 1625 05/08/24 2313 05/09/24 0419  BP: (!) 156/112 (!) 150/111 (!) 135/99 (!) 143/98  Pulse: (!) 111 (!) 107 97 (!) 101  Resp: 16  18 18   Temp: 98.1 F (36.7 C) 97.6 F (36.4 C) 98.5 F (36.9 C) 98.3 F (36.8 C)  TempSrc: Oral Oral    SpO2: 95% 95% 96% 100%  Weight:    76.4 kg  Height:        Intake/Output Summary (Last 24 hours) at 05/09/2024 0758 Last data filed at 05/09/2024 0425 Gross per 24 hour  Intake 336 ml  Output 900 ml  Net -564 ml   Filed Weights   05/07/24 1611 05/08/24 0500 05/09/24  0419  Weight: 69 kg 70 kg 76.4 kg    Examination:  General: Chronically on room air.  No distress ENT/neck: No thyromegaly.  JVD is not elevated  respiratory: Decreased breath sounds at bases bilaterally with some crackles; no wheezing CVS: S1-S2 heard, intermittently tachycardic  abdominal: Soft, nontender,  slightly distended; no organomegaly, bowel sounds heard  extremities: Bilateral lower extremity edema; no cyanosis  CNS: Awake and alert.  Remains slow to respond.  Poor historian.  No focal neurologic deficit.  Moves extremities Lymph: No obvious lymphadenopathy Skin: No obvious ecchymosis/lesions  psych: Mostly flat affect.  Currently not agitated.  Intermittently gets upset. musculoskeletal: No obvious joint swelling/deformity     Data Reviewed: I have personally reviewed following labs and imaging studies  CBC: Recent Labs  Lab 05/07/24 1609 05/08/24 0811 05/09/24 0519  WBC 11.5* 13.8* 11.0*  NEUTROABS 7.9*  --  8.1*  HGB 12.6* 15.3 13.9  HCT 37.5* 46.2 41.4  MCV 90.1 89.2 89.6  PLT 405* 347 337   Basic Metabolic Panel: Recent Labs  Lab 05/07/24 1609 05/08/24 0811 05/09/24 0519  NA 136 137 137  K 4.5 4.2 3.3*  CL 105 104 104  CO2 22 22 25   GLUCOSE 115* 140* 167*  BUN 18 15 18   CREATININE 1.07 1.03 1.14  CALCIUM  8.0* 7.9* 7.7*  MG  --  1.8 1.8   GFR: Estimated Creatinine Clearance: 90.3 mL/min (by C-G formula based on SCr of 1.14 mg/dL). Liver Function Tests: Recent Labs  Lab 05/07/24 1609 05/09/24 0519  AST 46* 50*  ALT 45* 71*  ALKPHOS 76 71  BILITOT 1.4* 0.6  PROT 6.3* 5.0*  ALBUMIN 2.6* 1.9*   Recent Labs  Lab 05/07/24 1609  LIPASE 32   No results for input(s): AMMONIA in the last 168 hours. Coagulation Profile: Recent Labs  Lab 05/07/24 1609  INR 1.1   Cardiac Enzymes: No results for input(s): CKTOTAL, CKMB, CKMBINDEX, TROPONINI in the last 168 hours. BNP (last 3 results) No results for input(s): PROBNP in the last 8760 hours. HbA1C: No results for input(s): HGBA1C in the last 72 hours. CBG: No results for input(s): GLUCAP in the last 168 hours. Lipid Profile: No results for input(s): CHOL, HDL, LDLCALC, TRIG, CHOLHDL, LDLDIRECT in the last 72 hours. Thyroid Function Tests: No results for input(s):  TSH, T4TOTAL, FREET4, T3FREE, THYROIDAB in the last 72 hours. Anemia Panel: No results for input(s): VITAMINB12, FOLATE, FERRITIN, TIBC, IRON, RETICCTPCT in the last 72 hours. Sepsis Labs: Recent Labs  Lab 05/07/24 1614 05/08/24 0811  PROCALCITON  --  0.23  LATICACIDVEN 1.8  --     Recent Results (from the past 240 hours)  Blood Culture (routine x 2)     Status: None (Preliminary result)   Collection Time: 05/07/24  3:55 PM   Specimen: BLOOD LEFT ARM  Result Value Ref Range Status   Specimen Description   Final    BLOOD LEFT ARM Performed at [facility], [address], [place], [place] [address]    Special Requests   Final    BOTTLES DRAWN AEROBIC AND ANAEROBIC Blood Culture adequate volume Performed at [facility], [address], [place], [place] [address]    Culture   Final    NO GROWTH 2 DAYS Performed at [facility], [address], [place], [place] [address]    Report Status PENDING  Incomplete  Resp panel by RT-PCR (RSV, Flu A&B, Covid) Peripheral  Status: None   Collection Time: 05/07/24  4:07 PM   Specimen: Peripheral; Nasal Swab  Result Value Ref Range Status   SARS Coronavirus 2 by RT PCR NEGATIVE NEGATIVE Final    Comment: (NOTE) SARS-CoV-2 target nucleic acids are NOT DETECTED.  The SARS-CoV-2 RNA is generally detectable in upper respiratory specimens during the acute phase of infection. The lowest concentration of SARS-CoV-2 viral copies this assay can detect is 138 copies/mL. A negative result does not preclude SARS-Cov-2 infection and should not be used as the sole basis for treatment or other patient management decisions. A negative result may occur with  improper specimen collection/handling, submission of specimen other than nasopharyngeal swab, presence of viral mutation(s) within the areas targeted by this assay, and inadequate number of viral copies(<138 copies/mL). A negative result must  be combined with clinical observations, patient history, and epidemiological information. The expected result is Negative.  Fact Sheet for Patients:  BloggerCourse.com  Fact Sheet for Healthcare Providers:  SeriousBroker.it  This test is no t yet approved or cleared by the [place]  FDA and  has been authorized for detection and/or diagnosis of SARS-CoV-2 by FDA under an Emergency Use Authorization (EUA). This EUA will remain  in effect (meaning this test can be used) for the duration of the COVID-19 declaration under Section 564(b)(1) of the Act, 21 U.S.C.section 360bbb-3(b)(1), unless the authorization is terminated  or revoked sooner.       Influenza A by PCR NEGATIVE NEGATIVE Final   Influenza B by PCR NEGATIVE NEGATIVE Final    Comment: (NOTE) The Xpert Xpress SARS-CoV-2/FLU/RSV plus assay is intended as an aid in the diagnosis of influenza from Nasopharyngeal swab specimens and should not be used as a sole basis for treatment. Nasal washings and aspirates are unacceptable for Xpert Xpress SARS-CoV-2/FLU/RSV testing.  Fact Sheet for Patients: BloggerCourse.com  Fact Sheet for Healthcare Providers: SeriousBroker.it  This test is not yet approved or cleared by the [place]  FDA and has been authorized for detection and/or diagnosis of SARS-CoV-2 by FDA under an Emergency Use Authorization (EUA). This EUA will remain in effect (meaning this test can be used) for the duration of the COVID-19 declaration under Section 564(b)(1) of the Act, 21 U.S.C. section 360bbb-3(b)(1), unless the authorization is terminated or revoked.     Resp Syncytial Virus by PCR NEGATIVE NEGATIVE Final    Comment: (NOTE) Fact Sheet for Patients: BloggerCourse.com  Fact Sheet for Healthcare Providers: SeriousBroker.it  This test is not  yet approved or cleared by the [place]  FDA and has been authorized for detection and/or diagnosis of SARS-CoV-2 by FDA under an Emergency Use Authorization (EUA). This EUA will remain in effect (meaning this test can be used) for the duration of the COVID-19 declaration under Section 564(b)(1) of the Act, 21 U.S.C. section 360bbb-3(b)(1), unless the authorization is terminated or revoked.  Performed at [facility], [address], [place], [place] [address]   Blood Culture (routine x 2)     Status: None (Preliminary result)   Collection Time: 05/07/24  9:08 PM   Specimen: BLOOD LEFT ARM  Result Value Ref Range Status   Specimen Description   Final    BLOOD LEFT ARM Performed at [facility], [address], [place], [place] [address]    Special Requests   Final    BOTTLES DRAWN AEROBIC AND ANAEROBIC Blood Culture adequate volume Performed at [facility], [address] [place], [place] [address]  Culture   Final    NO GROWTH 2 DAYS Performed at [facility], [address], [place], [place] [address]    Report Status PENDING  Incomplete         Radiology Studies: ECHOCARDIOGRAM COMPLETE Result Date: 05/08/2024    ECHOCARDIOGRAM REPORT   Patient Name:   Jeremy Fritz Date of Exam: 05/08/2024 Medical Rec #:  [id number]       Height:       72.0 in Accession #:    [id number]      Weight:       154.3 lb Date of Birth:  1981-06-02      BSA:          1.908 m Patient Age:    42 years        BP: Patient Gender: M               HR: Exam Location:  Inpatient Procedure: 2D Echo (Both Spectral and Color Flow Doppler were utilized during            procedure).  Referring Phys: [id number] [facility] IMPRESSIONS  1. Left ventricular ejection fraction, by estimation, is 20%. The left ventricle demonstrates global hypokinesis. The left ventricular internal cavity size was severely dilated.  2. Only basal portion of RV contracts well..  Right ventricular systolic function is moderately reduced. The right ventricular size is normal.  3. Left atrial size was mild to moderately dilated.  4. Right atrial size was mildly dilated.  5. MR appears functional due to severe ventricular dysfunction/dilation.. The mitral valve is normal in structure. Moderate to severe mitral valve regurgitation.  6. The aortic valve is tricuspid. Aortic valve regurgitation is not visualized.  7. The inferior vena cava is normal in size with greater than 50% respiratory variability, suggesting right atrial pressure of 3 mmHg. FINDINGS  Left Ventricle: Left ventricular ejection fraction, by estimation, is 20%. The left ventricle demonstrates global hypokinesis. The left ventricular internal cavity size was severely dilated. There is no left ventricular hypertrophy. Right Ventricle: Only basal portion of RV contracts well. The right ventricular size is normal. Right vetricular wall thickness was not assessed. Right ventricular systolic function is moderately reduced. Left Atrium: Left atrial size was mild to moderately dilated. Right Atrium: Right atrial size was mildly dilated. Pericardium: There is no evidence of pericardial effusion. Mitral Valve: MR appears functional due to severe ventricular dysfunction/dilation. The mitral valve is normal in structure. Moderate to severe mitral valve regurgitation. Tricuspid Valve: The tricuspid valve is normal in structure. Tricuspid valve regurgitation is mild. Aortic Valve: The aortic valve is tricuspid. Aortic valve regurgitation is not visualized. Pulmonic Valve: The pulmonic valve was normal in structure. Pulmonic valve regurgitation is mild. Aorta: The aortic root and ascending aorta are structurally normal, with no evidence of dilitation. Venous: The inferior vena cava is normal in size with greater than 50% respiratory variability, suggesting right atrial pressure of 3 mmHg. IAS/Shunts: No atrial level shunt detected by color  flow Doppler.  LEFT VENTRICLE PLAX 2D LVIDd:         6.60 cm      Diastology LVIDs:         6.10 cm      LV e' medial:    4.57 cm/s LV PW:         0.90 cm      LV E/e' medial:  25.8 LV IVS:        0.80 cm  LV e' lateral:   4.13 cm/s LVOT diam:     2.40 cm      LV E/e' lateral: 28.6 LV SV:         50 LV SV Index:   26 LVOT Area:     4.52 cm  LV Volumes (MOD) LV vol d, MOD A2C: 277.0 ml LV vol d, MOD A4C: 244.0 ml LV vol s, MOD A2C: 237.0 ml LV vol s, MOD A4C: 180.0 ml LV SV MOD A2C:     40.0 ml LV SV MOD A4C:     244.0 ml LV SV MOD BP:      51.7 ml RIGHT VENTRICLE             IVC RV S prime:     11.10 cm/s  IVC diam: 2.00 cm TAPSE (M-mode): 1.5 cm LEFT ATRIUM           Index        RIGHT ATRIUM           Index LA diam:      4.60 cm 2.41 cm/m   RA Area:     19.10 cm LA Vol (A4C): 79.0 ml 41.40 ml/m  RA Volume:   57.80 ml  30.29 ml/m  AORTIC VALVE             PULMONIC VALVE LVOT Vmax:   78.30 cm/s  PR End Diast Vel: 16.40 msec LVOT Vmean:  54.300 cm/s LVOT VTI:    0.110 m  AORTA Ao Root diam: 3.00 cm Ao Asc diam:  3.10 cm MITRAL VALVE                  TRICUSPID VALVE MV Area (PHT): 5.93 cm       TR Peak grad:   32.3 mmHg MV Decel Time: 128 msec       TR Vmax:        284.00 cm/s MR Peak grad:    105.3 mmHg MR Mean grad:    72.0 mmHg    SHUNTS MR Vmax:         513.00 cm/s  Systemic VTI:  0.11 m MR Vmean:        399.0 cm/s   Systemic Diam: 2.40 cm MR PISA:         2.26 cm MR PISA Eff ROA: 41 mm MR PISA Radius:  0.60 cm MV E velocity: 118.00 cm/s Vina Gull MD Electronically signed by Vina Gull MD Signature Date/Time: 05/08/2024/6:36:47 PM    Final    CT ABDOMEN PELVIS W CONTRAST Result Date: 05/07/2024 CLINICAL DATA:  Sepsis. EXAM: CT ABDOMEN AND PELVIS WITH CONTRAST TECHNIQUE: Multidetector CT imaging of the abdomen and pelvis was performed using the standard protocol following bolus administration of intravenous contrast. RADIATION DOSE REDUCTION: This exam was performed according to the departmental  dose-optimization program which includes automated exposure control, adjustment of the mA and/or kV according to patient size and/or use of iterative reconstruction technique. CONTRAST:  100mL OMNIPAQUE  IOHEXOL  300 MG/ML  SOLN COMPARISON:  No prior abdominal imaging. Chest radiograph earlier today reviewed FINDINGS: Lower chest: The heart is enlarged. Small bilateral pleural effusions, right greater than left. Multifocal consolidative and nodular opacities in both lungs, greatest in the right lower lobe. Hepatobiliary: Prominent periportal edema. There is no evidence of focal liver lesion. Diffuse gallbladder wall thickening. No calcified gallstone. Biliary tree is poorly defined on the current exam. Pancreas: No ductal dilatation or inflammation. Spleen: 7 mm low-density in the  inferior spleen, too small to characterize. Adrenals/Urinary Tract: Normal adrenal glands. No hydronephrosis. Mild heterogeneous enhancement involving the left kidney, series 12, image 33. Early excretion of IV contrast in the renal collecting systems. Bilateral renal cysts. No further follow-up imaging is recommended. Urinary bladder is completely decompressed and not well assessed. Stomach/Bowel: Bowel assessment is limited in the absence of enteric contrast and paucity of intra-abdominal fat. No bowel obstruction or abnormal distention. There is no obvious bowel wall thickening. Small volume of formed stool in the colon. The appendix is not definitively visualized, regardless, no evidence of appendicitis. Vascular/Lymphatic: Mild aortic atherosclerosis. No aneurysm. The IVC is prominent in diameter. Portal vein is patent. No bulky adenopathy. Reproductive: Prostate is unremarkable. Other: Generalized edema of the subcutaneous and intra-abdominal fat. No definite ascites. No free air. Musculoskeletal: There are no acute or suspicious osseous abnormalities. IMPRESSION: 1. Multifocal consolidative and nodular opacities in both lung basess,  suspicious for multifocal pneumonia. 2. Small bilateral pleural effusions, right greater than left. 3. Cardiomegaly. 4. Prominent periportal edema and diffuse gallbladder wall thickening, likely related to volume overload. 5. Mild heterogeneous enhancement involving the left kidney, can be seen with pyelonephritis. Recommend correlation with urinalysis. 6. Generalized edema of the subcutaneous and intra-abdominal fat suggestive of third spacing. Aortic Atherosclerosis (ICD10-I70.0). Electronically Signed   By: Andrea Gasman M.D.   On: 05/07/2024 18:09   DG Chest Port 1 View Result Date: 05/07/2024 CLINICAL DATA:  Questionable sepsis - evaluate for abnormality EXAM: PORTABLE CHEST 1 VIEW COMPARISON:  January 08, 2023 FINDINGS: The cardiomediastinal silhouette is enlarged in contour. No pleural effusion. No pneumothorax. Bilateral perihilar predominant reticulonodular airspace opacities. Mild peribronchial cuffing. Mild interstitial prominence. IMPRESSION: Bilateral perihilar predominant reticulonodular airspace opacities with peribronchial cuffing. Findings are favored to reflect pulmonary edema versus atypical infection. Electronically Signed   By: Corean Salter M.D.   On: 05/07/2024 16:50        Scheduled Meds:  aspirin  EC  81 mg Oral Daily   atorvastatin   40 mg Oral Daily   azithromycin   500 mg Oral Daily   dextromethorphan -guaiFENesin   1 tablet Oral BID   enoxaparin  (LOVENOX ) injection  40 mg Subcutaneous Q24H   furosemide   40 mg Intravenous Daily   sacubitril -valsartan   1 tablet Oral BID   spironolactone   12.5 mg Oral Daily   Continuous Infusions:  cefTRIAXone  (ROCEPHIN )  IV Stopped (05/08/24 1711)          Sophie Mao, MD [facility] 05/09/2024, 7:58 AM

## 2024-05-09 NOTE — Plan of Care (Signed)
  Problem: Education: Goal: Knowledge of General Education information will improve Description: Including pain rating scale, medication(s)/side effects and non-pharmacologic comfort measures Outcome: Progressing   Problem: Health Behavior/Discharge Planning: Goal: Ability to manage health-related needs will improve Outcome: Progressing   Problem: Nutrition: Goal: Adequate nutrition will be maintained Outcome: Progressing   Problem: Coping: Goal: Level of anxiety will decrease Outcome: Progressing   Problem: Elimination: Goal: Will not experience complications related to bowel motility Outcome: Progressing   Problem: Activity: Goal: Ability to tolerate increased activity will improve Outcome: Progressing   Problem: Respiratory: Goal: Ability to maintain a clear airway will improve Outcome: Progressing
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
## 2024-05-10 ENCOUNTER — Encounter (HOSPITAL_COMMUNITY): Payer: Self-pay | Admitting: Student

## 2024-05-10 DIAGNOSIS — A419 Sepsis, unspecified organism: Secondary | ICD-10-CM | POA: Diagnosis not present

## 2024-05-10 DIAGNOSIS — J189 Pneumonia, unspecified organism: Secondary | ICD-10-CM | POA: Diagnosis not present

## 2024-05-10 LAB — CBC WITH DIFFERENTIAL/PLATELET
Abs Immature Granulocytes: 0.04 K/uL (ref 0.00–0.07)
Basophils Absolute: 0.1 K/uL (ref 0.0–0.1)
Basophils Relative: 1 %
Eosinophils Absolute: 0.1 K/uL (ref 0.0–0.5)
Eosinophils Relative: 1 %
HCT: 42.5 % (ref 39.0–52.0)
Hemoglobin: 14.3 g/dL (ref 13.0–17.0)
Immature Granulocytes: 0 %
Lymphocytes Relative: 17 %
Lymphs Abs: 2 K/uL (ref 0.7–4.0)
MCH: 29.7 pg (ref 26.0–34.0)
MCHC: 33.6 g/dL (ref 30.0–36.0)
MCV: 88.2 fL (ref 80.0–100.0)
Monocytes Absolute: 0.8 K/uL (ref 0.1–1.0)
Monocytes Relative: 7 %
Neutro Abs: 8.6 K/uL — ABNORMAL HIGH (ref 1.7–7.7)
Neutrophils Relative %: 74 %
Platelets: 391 K/uL (ref 150–400)
RBC: 4.82 MIL/uL (ref 4.22–5.81)
RDW: 15 % (ref 11.5–15.5)
WBC: 11.5 K/uL — ABNORMAL HIGH (ref 4.0–10.5)
nRBC: 0 % (ref 0.0–0.2)

## 2024-05-10 LAB — BASIC METABOLIC PANEL WITH GFR
Anion gap: 7 (ref 5–15)
BUN: 17 mg/dL (ref 6–20)
CO2: 24 mmol/L (ref 22–32)
Calcium: 8.2 mg/dL — ABNORMAL LOW (ref 8.9–10.3)
Chloride: 104 mmol/L (ref 98–111)
Creatinine, Ser: 0.94 mg/dL (ref 0.61–1.24)
GFR, Estimated: >60 mL/min (ref >60)
Glucose, Bld: 114 mg/dL — ABNORMAL HIGH (ref 70–99)
Potassium: 3.9 mmol/L (ref 3.5–5.1)
Sodium: 135 mmol/L (ref 135–145)

## 2024-05-10 LAB — MAGNESIUM: Magnesium: 1.9 mg/dL (ref 1.7–2.4)

## 2024-05-10 MED ORDER — SPIRONOLACTONE 25 MG PO TABS
25.0000 mg | ORAL_TABLET | Freq: Every day | ORAL | Status: DC
  Administered 2024-05-11: 25 mg via ORAL
  Filled 2024-05-10: qty 1

## 2024-05-10 MED ORDER — FUROSEMIDE 40 MG PO TABS
40.0000 mg | ORAL_TABLET | Freq: Every day | ORAL | Status: DC
  Administered 2024-05-11: 40 mg via ORAL
  Filled 2024-05-10: qty 1

## 2024-05-10 MED ORDER — ADULT MULTIVITAMIN W/MINERALS CH
1.0000 | ORAL_TABLET | Freq: Every day | ORAL | Status: DC
  Administered 2024-05-10 – 2024-05-11 (×2): 1 via ORAL
  Filled 2024-05-10 (×2): qty 1

## 2024-05-10 MED ORDER — BENZONATATE 100 MG PO CAPS
200.0000 mg | ORAL_CAPSULE | Freq: Three times a day (TID) | ORAL | Status: DC | PRN
  Administered 2024-05-11: 200 mg via ORAL
  Filled 2024-05-10: qty 2

## 2024-05-10 MED ORDER — SACUBITRIL-VALSARTAN 49-51 MG PO TABS
1.0000 | ORAL_TABLET | Freq: Two times a day (BID) | ORAL | Status: DC
  Administered 2024-05-10 – 2024-05-11 (×3): 1 via ORAL
  Filled 2024-05-10 (×4): qty 1

## 2024-05-10 MED ORDER — ENSURE PLUS HIGH PROTEIN PO LIQD
237.0000 mL | Freq: Two times a day (BID) | ORAL | Status: DC
  Administered 2024-05-10 – 2024-05-11 (×3): 237 mL via ORAL

## 2024-05-10 NOTE — Progress Notes (Signed)
 Initial Nutrition Assessment  INTERVENTION:   -Ensure Plus High Protein po TID, each supplement provides 350 kcal and 20 grams of protein.   -Multivitamin with minerals daily  NUTRITION DIAGNOSIS:   Inadequate oral intake related to social / environmental circumstances as evidenced by per patient/family report.  GOAL:   Patient will meet greater than or equal to 90% of their needs  MONITOR:   PO intake, Supplement acceptance  REASON FOR ASSESSMENT:   Consult Assessment of nutrition requirement/status  ASSESSMENT:   43 y.o. male with medical history significant for polysubstance use disorder (cocaine and THC), HFrEF, CVA, HTN, tobacco use disorder, HLD, homelessness, noncompliance to medications and follow-up presented with cough, fever and leg swelling.  Patient in room, eating lunch. Mother just entered room. Pt irritated by questioning initially, was a bit sensitive when housing was brought up. Eventually was able to answer my questions. Pt states he has an appetite but d/t being homeless doesn't have consistent access to food. He eats the best he can. Ate breakfast this morning. Agreeable to Ensure supplements for additional nutrition.  Denies any issues with swallowing or chewing.  Pt reports weight loss, UBW ~180 lbs. Unsure when last weighed UBW.  Admission weight: 152 lbs Current weight: 158 lbs  -being diuresed.   Medications: Lasix , Aldactone   Labs reviewed.   NUTRITION - FOCUSED PHYSICAL EXAM:  Deferred per patient  Diet Order:   Diet Order             Diet Heart Room service appropriate? Yes; Fluid consistency: Thin; Fluid restriction: 1200 mL Fluid  Diet effective now                   EDUCATION NEEDS:   No education needs have been identified at this time  Skin:  Skin Assessment: Reviewed RN Assessment  Last BM:  7/14  Height:   Ht Readings from Last 1 Encounters:  05/07/24 6' (1.829 m)    Weight:   Wt Readings from Last 1  Encounters:  05/10/24 72 kg    BMI:  Body mass index is 21.53 kg/m.  Estimated Nutritional Needs:   Kcal:  2200-2400  Protein:  105-120g  Fluid:  2L/day   Morna Lee, MS, RD, LDN Inpatient Clinical Dietitian Contact via Secure chat

## 2024-05-10 NOTE — Progress Notes (Signed)
 Heart Failure Nurse Navigator Progress Note  PCP: Patient, No Pcp Per PCP-Cardiologist: Loni Admission Diagnosis: None Admitted from: ? ( Homeless)  Presentation:   Penne KANDICE Lingo presented with cough, BLE edema , shortness of breath, and malaise for 4 days, has right side weakness from prior CVA, reported to using crack daily, and used day of admission, also states he is homeless and hasn't taken any medications in a  long time. BP 107/73, HR 85, BNP 607.4, UDS + for Cocaine and Tetrahydrocannabinol. CBC notable for leukocytosis, blood cells 11.5, mild anemia, hemoglobin 12.6.  CXR shows aspirate opacities concerning for pulmonary edema versus atypical infection.  CTA A/P with no acute intra-abdominal pathology but shows evidence of multifocal pneumonia. Sepsis protocol was activated and patient received IV fluid, IV Rocephin  and IV azithromycin .   Patient was called at [facility] on his room phone. Education done on the sign and symptoms of heart failure, daily weights, when to call his doctor, or go to the ED. Diet/ fluid restrictions, patient stated , I am homeless and eat and drink what I can get. Continued education on taking all medications as prescribed and attending all medical appointments. Navigator reached out to [employer] with help in providing patient with resources for housing and transportation. Patient wanting to attend [facility], and was told how the process works. Patient also has Medicaid now and can call for transportation needs. Navigator asked CSW to explain to patient how this process works as well as how to apply for disability. There were no Living Better with Heart Failure  books, on unit , so one will be provided to the patient at his appointment. Patient verbalized his understanding of education. A HF TOC appointment was scheduled for 05/17/2024 @ 2 pm.   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  Tobacco Use   Smoking status: Every Day    Current packs/day: 0.50    Average packs/day: 0.5 packs/day for 12.0 years (6.0 ttl pk-yrs)    Types: Cigarettes   Smokeless tobacco: Not on file  Vaping Use   Vaping status: Former   Substances: Flavoring  Substance and Sexual Activity   Alcohol  use: Not Currently    Alcohol /week: 24.0 standard drinks of alcohol     Types: 24 Cans of beer per week   Drug use: Yes    Types: Cocaine, Marijuana, Crack cocaine    Comment: 2days before admission   Sexual activity: Yes  Other Topics Concern   Not on file  Social History Narrative   Not on file   Social Drivers of Health   Financial Resource Strain: High Risk (05/10/2024)   Overall Financial Resource Strain (CARDIA)  Difficulty of Paying Living Expenses: Hard  Food Insecurity: Food Insecurity Present (05/10/2024)   Hunger Vital Sign    Worried About Running Out of Food in the Last Year: Sometimes true    Ran Out of Food in the Last Year: Sometimes true  Transportation Needs: No Transportation Needs (05/10/2024)   PRAPARE - Administrator, Civil Service (Medical): No    Lack of Transportation (Non-Medical): No  Physical Activity: Not on file  Stress: Not on file  Social Connections: Not on file   Education Assessment and Provision:  Detailed education  and instructions provided on heart failure disease management including the following:  Signs and symptoms of Heart Failure When to call the physician Importance of daily weights Low sodium diet Fluid restriction Medication management Anticipated future follow-up appointments  Patient education given on each of the above topics.  Patient acknowledges understanding via teach back method and acceptance of all instructions.  Education Materials:  Living Better With Heart Failure Booklet, HF zone tool, & Daily Weight Tracker Tool.  Patient has scale at home: No,  Patient has pill box at home: NA    High Risk Criteria for Readmission and/or Poor Patient Outcomes: Heart failure hospital admissions (last 6 months): 1  No Show rate: 31% Difficult social situation: Yes, Homeless, LCSW at [employer] involved Demonstrates medication adherence: No Primary Language: English Literacy level: Reading, writing,   Barriers of Care:   Medication/ appointment  compliance Substance use Diet/ fluid restrictions/ daily weights  Considerations/Referrals:   Referral made to Heart Failure Pharmacist Stewardship: NA, patient admitted to [facility] Referral made to Heart Failure CSW/NCM TOC: yes, reach out to [facility] Sheri Sharps for resources and bus passes Referral made to Heart & Vascular TOC clinic: Yes, 05/17/2024 @ 2 pm, was a NO Show on 01/26/2023.   Items for Follow-up on DC/TOC: Medication compliance/ costs Diet/ fluid restrictions/ daily weights Substance abuse cessation Continued HF education   Stephane Haddock, BSN, RN Heart Failure Print production planner Chat Only

## 2024-05-10 NOTE — Progress Notes (Signed)
  Progress Note  Patient Name: Jeremy Fritz Date of Encounter: 05/10/2024  [facility] Cardiologist: Soyla DELENA Merck, MD   Interval Summary   Patient reports feeling OK this AM. Continues to have orthopnea, dyspnea on exertion. Swelling has resolved. About to work with PT   Vital Signs Vitals:   05/09/24 2039 05/09/24 2049 05/10/24 0500 05/10/24 0548  BP:  (!) 139/104  (!) 148/110  Pulse:    (!) 109  Resp:    20  Temp:    98.9 F (37.2 C)  TempSrc:      SpO2: 96%   97%  Weight:   72 kg   Height:        Intake/Output Summary (Last 24 hours) at 05/10/2024 1007 Last data filed at 05/10/2024 0245 Gross per 24 hour  Intake 712 ml  Output 2450 ml  Net -1738 ml      05/10/2024    5:00 AM 05/09/2024    4:19 AM 05/08/2024    5:00 AM  Last 3 Weights  Weight (lbs) 158 lb 11.7 oz 168 lb 8 oz 154 lb 5.2 oz  Weight (kg) 72 kg 76.431 kg 70 kg      Telemetry/ECG  Sinus tachycardia, HR 100-110 - Personally Reviewed  Physical Exam  GEN: No acute distress.  Sitting upright on the side of the bed  Neck: No JVD Cardiac:  Regular rhythm, tachycardic, no murmurs, rubs, or gallops.  Respiratory: Clear to auscultation bilaterally. Normal WOB on room air  GI: Soft, nontender, non-distended  MS: No edema in BLE   Assessment & Plan   Acute on chronic systolic heart failure Moderate-severe MR  HTN  - Echocardiogram this admission showed EF 20%, moderately reduced RV systolic function, moderate-severe MR that appears to be functional due to severe ventricular dysfunction, small pericardial effusion   - Patient has been off his medications and continues to struggle with substance abuse  - He has been on IV lasix  40 mg daily- output 2.45 L urine yesterday. Renal function stable  - Patient continues to report orthopnea, DOE. Appears euvolemic on exam. Has already received IV lasix  40 mg this AM  - Stop IV lasix  and start PO 40 mg daily tomorrow AM  - Not a candidate for BB with  cocaine use  - Continue spironolactone  12.5 mg daily, farxiga  10 mg daily  - Increase entresto  to 49 51 mg BID for elevated BP  - Renal function stable   Persistent Tachycardia  - HR remains in the low 100s. He is asymptomatic with this  - Likely due to recent cocaine use. No BB at this time, can possibly use carvedilol  in the future   History of CVA  - Resumed ASA 81 mg daily and lipitor 40 mg daily  - Repeat LFTs and lipids in 2-3 months   Polysubstance abuse  - Admits to daily use of cocaine and THC  - Patient is working with social work- planning to go to [facility] in [place] after discharge   For questions or updates, please contact [facility] Please consult [email or web address] for contact info under       Signed, Rollo FABIENE Louder, PA-C

## 2024-05-10 NOTE — Progress Notes (Addendum)
 PROGRESS NOTE    Jeremy Fritz  FMW:[id number] DOB: Feb 09, 1981 DOA: 05/07/2024 PCP: Patient, No Pcp Per   Brief Narrative:  43 y.o. male with medical history significant for polysubstance use disorder (cocaine and THC), HFrEF, CVA, HTN, tobacco use disorder, HLD, homelessness, noncompliance to medications and follow-up presented with cough, fever and leg swelling.  On presentation, he was febrile 201, tachycardic with WBC of 11.5, BNP of 607, normal kidney function with negative COVID/influenza/RSV PCR.  UA did not show signs of infection.  UDS was positive for cocaine and THC.  Chest x-ray showed airspace opacities concerning for pulmonary edema versus atypical infection.  CTA abdomen/pelvis showed no acute intra-abdominal pathology but showed evidence of multifocal pneumonia.  He was started on IV fluids and antibiotics.  Subsequently IV fluids was discontinued and he was started on IV Lasix .  Echo showed EF of 20% with moderate to severe MR.  Cardiology was consulted.  Assessment & Plan:   Sepsis: Present on admission Multifocal pneumonia - Presented with cough, shortness of breath, fever due to multifocal pneumonia and was febrile, tachycardic with leukocytosis -Continue Rocephin  and Zithromax : Finish 5-day course of therapy.  Currently on room air.  IV fluids have been discontinued  Acute on chronic systolic CHF Moderate to severe mitral regurgitation - Last TTE on 12/2022 had shown EF of 25 to 30% with moderate to severe TVR.  Patient is noncompliant to medications and follow-up and has not followed up with cardiologist as an outpatient - Echo from 05/08/2024 showed EF of 20% with moderate to severe mitral regurgitation.  Continue IV Lasix .  Spironolactone  and Entresto  have been resumed.  Strict input output.  Daily weights.  Fluid restriction.  Telemetry monitoring - Cardiology consulted on 05/09/2024.  Farxiga  has been started.   Uncontrolled hypertension - Blood pressure still  elevated.  Monitor.  Antihypertensive regimen as above.  Coreg  on hold due to cocaine use  History of unspecified CVA Hyperlipidemia -Diagnosed with moderate to large right MCA infarct in March 2024 - Discharged home on DAPT with aspirin  and Plavix  x 3 months followed by aspirin  alone - Patient reports he is noncompliant with his medications - Resumed aspirin  and atorvastatin .  Outpatient follow-up with neurology  Polysubstance use disorder Homelessness Noncompliance - Counseled regarding abstinence from illicit drug use.  Urine drug screen positive for cocaine and THC. -THC following.  Leukocytosis - Mild.  Monitor intermittently.  Hypokalemia - Improved.  Elevated LFTs - Only mildly elevated.  Repeat a.m. labs     DVT prophylaxis: Lovenox  Code Status: Full Family Communication: None at bedside Disposition Plan: Status is: Inpatient Remains inpatient appropriate because: Of severity of illness    Consultants: Cardiology Procedures: Echo  Antimicrobials: Rocephin  and Zithromax  from 05/07/2024 onwards   Subjective: Patient seen and examined at bedside.  Poor historian.  Still complains of intermittent shortness of breath and abdominal pain.  No fever, vomiting reported. Objective: Vitals:   05/09/24 2039 05/09/24 2049 05/10/24 0500 05/10/24 0548  BP:  (!) 139/104  (!) 148/110  Pulse:    (!) 109  Resp:    20  Temp:    98.9 F (37.2 C)  TempSrc:      SpO2: 96%   97%  Weight:   72 kg   Height:        Intake/Output Summary (Last 24 hours) at 05/10/2024 0826 Last data filed at 05/10/2024 0245 Gross per 24 hour  Intake 712 ml  Output 2450 ml  Net -1738 ml  Filed Weights   05/08/24 0500 05/09/24 0419 05/10/24 0500  Weight: 70 kg 76.4 kg 72 kg    Examination:  General: No acute distress.  Remains on room air.   ENT/neck: No JVD elevation or palpable neck masses noted  respiratory: Bilateral decreased breath sounds at bases with scattered crackles CVS:  Remains intermittently tachycardic; S1 and S2 are heard abdominal: Soft, nontender, remains distended; no organomegaly, bowel sounds heard normally extremities: No clubbing; lower extremity edema present bilaterally CNS: Alert and awake.  Still slow to respond and poor historian.  No focal neurologic deficit.  Able to move extremities Lymph: No obvious palpable lymphadenopathy Skin: No obvious petechiae/rashes psych: Not agitated currently.  Flat affect mostly.  Does not participate in conversation much. musculoskeletal: No obvious joint tenderness/erythema    Data Reviewed: I have personally reviewed following labs and imaging studies  CBC: Recent Labs  Lab 05/07/24 1609 05/08/24 0811 05/09/24 0519 05/10/24 0535  WBC 11.5* 13.8* 11.0* 11.5*  NEUTROABS 7.9*  --  8.1* 8.6*  HGB 12.6* 15.3 13.9 14.3  HCT 37.5* 46.2 41.4 42.5  MCV 90.1 89.2 89.6 88.2  PLT 405* 347 337 391   Basic Metabolic Panel: Recent Labs  Lab 05/07/24 1609 05/08/24 0811 05/09/24 0519 05/10/24 0535  NA 136 137 137 135  K 4.5 4.2 3.3* 3.9  CL 105 104 104 104  CO2 22 22 25 24   GLUCOSE 115* 140* 167* 114*  BUN 18 15 18 17   CREATININE 1.07 1.03 1.14 0.94  CALCIUM  8.0* 7.9* 7.7* 8.2*  MG  --  1.8 1.8 1.9   GFR: Estimated Creatinine Clearance: 103.2 mL/min (by C-G formula based on SCr of 0.94 mg/dL). Liver Function Tests: Recent Labs  Lab 05/07/24 1609 05/09/24 0519  AST 46* 50*  ALT 45* 71*  ALKPHOS 76 71  BILITOT 1.4* 0.6  PROT 6.3* 5.0*  ALBUMIN 2.6* 1.9*   Recent Labs  Lab 05/07/24 1609  LIPASE 32   No results for input(s): AMMONIA in the last 168 hours. Coagulation Profile: Recent Labs  Lab 05/07/24 1609  INR 1.1   Cardiac Enzymes: No results for input(s): CKTOTAL, CKMB, CKMBINDEX, TROPONINI in the last 168 hours. BNP (last 3 results) No results for input(s): PROBNP in the last 8760 hours. HbA1C: No results for input(s): HGBA1C in the last 72 hours. CBG: No  results for input(s): GLUCAP in the last 168 hours. Lipid Profile: No results for input(s): CHOL, HDL, LDLCALC, TRIG, CHOLHDL, LDLDIRECT in the last 72 hours. Thyroid Function Tests: No results for input(s): TSH, T4TOTAL, FREET4, T3FREE, THYROIDAB in the last 72 hours. Anemia Panel: No results for input(s): VITAMINB12, FOLATE, FERRITIN, TIBC, IRON, RETICCTPCT in the last 72 hours. Sepsis Labs: Recent Labs  Lab 05/07/24 1614 05/08/24 0811  PROCALCITON  --  0.23  LATICACIDVEN 1.8  --     Recent Results (from the past 240 hours)  Blood Culture (routine x 2)     Status: None (Preliminary result)   Collection Time: 05/07/24  3:55 PM   Specimen: BLOOD LEFT ARM  Result Value Ref Range Status   Specimen Description   Final    BLOOD LEFT ARM Performed at [facility], [address] [place], [place] [address]    Special Requests   Final    BOTTLES DRAWN AEROBIC AND ANAEROBIC Blood Culture adequate volume Performed at [facility], [address], [place], [place] [address]    Culture   Final    NO  GROWTH 3 DAYS Performed at [facility], [address] [place], [place] [address]    Report Status PENDING  Incomplete  Resp panel by RT-PCR (RSV, Flu A&B, Covid) Peripheral     Status: None   Collection Time: 05/07/24  4:07 PM   Specimen: Peripheral; Nasal Swab  Result Value Ref Range Status   SARS Coronavirus 2 by RT PCR NEGATIVE NEGATIVE Final    Comment: (NOTE) SARS-CoV-2 target nucleic acids are NOT DETECTED.  The SARS-CoV-2 RNA is generally detectable in upper respiratory specimens during the acute phase of infection. The lowest concentration of SARS-CoV-2 viral copies this assay can detect is 138 copies/mL. A negative result does not preclude SARS-Cov-2 infection and should not be used as the sole basis for treatment or other patient management decisions. A negative result may occur with  improper specimen  collection/handling, submission of specimen other than nasopharyngeal swab, presence of viral mutation(s) within the areas targeted by this assay, and inadequate number of viral copies(<138 copies/mL). A negative result must be combined with clinical observations, patient history, and epidemiological information. The expected result is Negative.  Fact Sheet for Patients:  BloggerCourse.com  Fact Sheet for Healthcare Providers:  SeriousBroker.it  This test is no t yet approved or cleared by the [place]  FDA and  has been authorized for detection and/or diagnosis of SARS-CoV-2 by FDA under an Emergency Use Authorization (EUA). This EUA will remain  in effect (meaning this test can be used) for the duration of the COVID-19 declaration under Section 564(b)(1) of the Act, 21 U.S.C.section 360bbb-3(b)(1), unless the authorization is terminated  or revoked sooner.       Influenza A by PCR NEGATIVE NEGATIVE Final   Influenza B by PCR NEGATIVE NEGATIVE Final    Comment: (NOTE) The Xpert Xpress SARS-CoV-2/FLU/RSV plus assay is intended as an aid in the diagnosis of influenza from Nasopharyngeal swab specimens and should not be used as a sole basis for treatment. Nasal washings and aspirates are unacceptable for Xpert Xpress SARS-CoV-2/FLU/RSV testing.  Fact Sheet for Patients: BloggerCourse.com  Fact Sheet for Healthcare Providers: SeriousBroker.it  This test is not yet approved or cleared by the [place]  FDA and has been authorized for detection and/or diagnosis of SARS-CoV-2 by FDA under an Emergency Use Authorization (EUA). This EUA will remain in effect (meaning this test can be used) for the duration of the COVID-19 declaration under Section 564(b)(1) of the Act, 21 U.S.C. section 360bbb-3(b)(1), unless the authorization is terminated or revoked.     Resp Syncytial  Virus by PCR NEGATIVE NEGATIVE Final    Comment: (NOTE) Fact Sheet for Patients: BloggerCourse.com  Fact Sheet for Healthcare Providers: SeriousBroker.it  This test is not yet approved or cleared by the [place]  FDA and has been authorized for detection and/or diagnosis of SARS-CoV-2 by FDA under an Emergency Use Authorization (EUA). This EUA will remain in effect (meaning this test can be used) for the duration of the COVID-19 declaration under Section 564(b)(1) of the Act, 21 U.S.C. section 360bbb-3(b)(1), unless the authorization is terminated or revoked.  Performed at [facility], [address] [place], [place] [address]   Blood Culture (routine x 2)     Status: None (Preliminary result)   Collection Time: 05/07/24  9:08 PM   Specimen: BLOOD LEFT ARM  Result Value Ref Range Status   Specimen Description   Final    BLOOD LEFT ARM Performed at [facility], [address]  [place], [place] [address]    Special Requests   Final    BOTTLES DRAWN AEROBIC AND ANAEROBIC Blood Culture adequate volume Performed at [facility], [address], [place], [place] [address]    Culture   Final    NO GROWTH 3 DAYS Performed at [facility], [address], [place], [place] [address]    Report Status PENDING  Incomplete         Radiology Studies: ECHOCARDIOGRAM COMPLETE Result Date: 05/08/2024    ECHOCARDIOGRAM REPORT   Patient Name:   CLARION MOONEYHAN Date of Exam: 05/08/2024 Medical Rec #:  [id number]       Height:       72.0 in Accession #:    [id number]      Weight:       154.3 lb Date of Birth:  11-20-1980      BSA:          1.908 m Patient Age:    42 years        BP: Patient Gender: M               HR: Exam Location:  Inpatient Procedure: 2D Echo (Both Spectral and Color Flow Doppler were utilized during            procedure).  Referring Phys: [id number] [facility]  IMPRESSIONS  1. Left ventricular ejection fraction, by estimation, is 20%. The left ventricle demonstrates global hypokinesis. The left ventricular internal cavity size was severely dilated.  2. Only basal portion of RV contracts well.. Right ventricular systolic function is moderately reduced. The right ventricular size is normal.  3. Left atrial size was mild to moderately dilated.  4. Right atrial size was mildly dilated.  5. MR appears functional due to severe ventricular dysfunction/dilation.. The mitral valve is normal in structure. Moderate to severe mitral valve regurgitation.  6. The aortic valve is tricuspid. Aortic valve regurgitation is not visualized.  7. The inferior vena cava is normal in size with greater than 50% respiratory variability, suggesting right atrial pressure of 3 mmHg. FINDINGS  Left Ventricle: Left ventricular ejection fraction, by estimation, is 20%. The left ventricle demonstrates global hypokinesis. The left ventricular internal cavity size was severely dilated. There is no left ventricular hypertrophy. Right Ventricle: Only basal portion of RV contracts well. The right ventricular size is normal. Right vetricular wall thickness was not assessed. Right ventricular systolic function is moderately reduced. Left Atrium: Left atrial size was mild to moderately dilated. Right Atrium: Right atrial size was mildly dilated. Pericardium: There is no evidence of pericardial effusion. Mitral Valve: MR appears functional due to severe ventricular dysfunction/dilation. The mitral valve is normal in structure. Moderate to severe mitral valve regurgitation. Tricuspid Valve: The tricuspid valve is normal in structure. Tricuspid valve regurgitation is mild. Aortic Valve: The aortic valve is tricuspid. Aortic valve regurgitation is not visualized. Pulmonic Valve: The pulmonic valve was normal in structure. Pulmonic valve regurgitation is mild. Aorta: The aortic root and ascending aorta are  structurally normal, with no evidence of dilitation. Venous: The inferior vena cava is normal in size with greater than 50% respiratory variability, suggesting right atrial pressure of 3 mmHg. IAS/Shunts: No atrial level shunt detected by color flow Doppler.  LEFT VENTRICLE PLAX 2D LVIDd:         6.60 cm      Diastology LVIDs:         6.10 cm      LV e' medial:  4.57 cm/s LV PW:         0.90 cm      LV E/e' medial:  25.8 LV IVS:        0.80 cm      LV e' lateral:   4.13 cm/s LVOT diam:     2.40 cm      LV E/e' lateral: 28.6 LV SV:         50 LV SV Index:   26 LVOT Area:     4.52 cm  LV Volumes (MOD) LV vol d, MOD A2C: 277.0 ml LV vol d, MOD A4C: 244.0 ml LV vol s, MOD A2C: 237.0 ml LV vol s, MOD A4C: 180.0 ml LV SV MOD A2C:     40.0 ml LV SV MOD A4C:     244.0 ml LV SV MOD BP:      51.7 ml RIGHT VENTRICLE             IVC RV S prime:     11.10 cm/s  IVC diam: 2.00 cm TAPSE (M-mode): 1.5 cm LEFT ATRIUM           Index        RIGHT ATRIUM           Index LA diam:      4.60 cm 2.41 cm/m   RA Area:     19.10 cm LA Vol (A4C): 79.0 ml 41.40 ml/m  RA Volume:   57.80 ml  30.29 ml/m  AORTIC VALVE             PULMONIC VALVE LVOT Vmax:   78.30 cm/s  PR End Diast Vel: 16.40 msec LVOT Vmean:  54.300 cm/s LVOT VTI:    0.110 m  AORTA Ao Root diam: 3.00 cm Ao Asc diam:  3.10 cm MITRAL VALVE                  TRICUSPID VALVE MV Area (PHT): 5.93 cm       TR Peak grad:   32.3 mmHg MV Decel Time: 128 msec       TR Vmax:        284.00 cm/s MR Peak grad:    105.3 mmHg MR Mean grad:    72.0 mmHg    SHUNTS MR Vmax:         513.00 cm/s  Systemic VTI:  0.11 m MR Vmean:        399.0 cm/s   Systemic Diam: 2.40 cm MR PISA:         2.26 cm MR PISA Eff ROA: 41 mm MR PISA Radius:  0.60 cm MV E velocity: 118.00 cm/s Vina Gull MD Electronically signed by Vina Gull MD Signature Date/Time: 05/08/2024/6:36:47 PM    Final         Scheduled Meds:  aspirin  EC  81 mg Oral Daily   atorvastatin   40 mg Oral Daily   azithromycin   500 mg Oral  Daily   dapagliflozin  propanediol  10 mg Oral Daily   dextromethorphan -guaiFENesin   1 tablet Oral BID   enoxaparin  (LOVENOX ) injection  40 mg Subcutaneous Q24H   furosemide   40 mg Intravenous Daily   pantoprazole   40 mg Oral Daily   sacubitril -valsartan   1 tablet Oral BID   spironolactone   12.5 mg Oral Daily   Continuous Infusions:  cefTRIAXone  (ROCEPHIN )  IV 1 g (05/09/24 1518)          Sophie Mao, MD [facility] 05/10/2024, 8:26 AM

## 2024-05-10 NOTE — Evaluation (Signed)
 Physical Therapy Brief Evaluation and Discharge Note Patient Details Name: Jeremy Fritz MRN: [id number] DOB: 06-29-1981 Today's Date: 05/10/2024   History of Present Illness  Jeremy Fritz is a 43 y.o. male admitted 05/07/24 with admit dx sepsis due to pneumonia with medical history significant for polysubstance use disorder (cocaine and THC), HFrEF, CVA, HTN, tobacco use disorder, HLD and OSA who presented to the ED for evaluation of cough, fever and leg swelling.  Clinical Impression  Pt admitted with above diagnosis. Pt in bed receiving meds. He is able to complete bed mobility at inc level of function, OOB mobility completed at mod I-ind, requires inc time for tasks but does not require physical assistance. Limited in endurance and activity tolerance. Anticipate good response to activity and sessions with mobility specialist while he continues to improve medically. Skilled physical therapy service is not indicated at this time. Should PT needs arise in the future please place consult.          PT Assessment Patient does not need any further PT services  Assistance Needed at Discharge  None    Equipment Recommendations None recommended by PT  Recommendations for Other Services       Precautions/Restrictions Precautions Precautions: Fall Recall of Precautions/Restrictions: Intact Restrictions Weight Bearing Restrictions Per Provider Order: No        Mobility  Bed Mobility Rolling: Independent Supine/Sidelying to sit: Independent Sit to supine/sidelying: Independent    Transfers Overall transfer level: Independent Equipment used: None               General transfer comment: no deficits or LOB noted with transfers    Ambulation/Gait Ambulation/Gait assistance: Modified independent (Device/Increase time) Gait Distance (Feet): 350 Feet Assistive device: None Gait Pattern/deviations: Step-through pattern, Wide base of support Gait Speed: Below  normal General Gait Details: increased EV BLE in stance and swing phase, no AD, good pattern, but requires inc time to complete distance, good safety awareness  Home Activity Instructions    Stairs            Modified Rankin (Stroke Patients Only)        Balance Overall balance assessment: Mild deficits observed, not formally tested Sitting-balance support: Feet supported, No upper extremity supported Sitting balance-Leahy Scale: Normal     Standing balance support: No upper extremity supported, During functional activity Standing balance-Leahy Scale: Good            Pertinent Vitals/Pain PT - Brief Vital Signs All Vital Signs Stable: Yes Pain Assessment Pain Assessment: No/denies pain     Home Living Family/patient expects to be discharged to:: Shelter/Homeless Living Arrangements: Alone           Additional Comments: TOC working on Designer, multimedia    Prior Function Level of Independence: Independent      UE/LE Assessment   UE ROM/Strength/Tone/Coordination: WFL    LE ROM/Strength/Tone/Coordination: Jeremy Fritz      Communication   Communication Communication: No apparent difficulties     Cognition Overall Cognitive Status: Appears within functional limits for tasks assessed/performed       General Comments      Exercises     Assessment/Plan    PT Problem List         PT Visit Diagnosis Other abnormalities of gait and mobility (R26.89)    No Skilled PT All education completed;Patient is modified independent with all activity/mobility   Co-evaluation  AMPAC 6 Clicks Help needed turning from your back to your side while in a flat bed without using bedrails?: None Help needed moving from lying on your back to sitting on the side of a flat bed without using bedrails?: None Help needed moving to and from a bed to a chair (including a wheelchair)?: None Help needed standing up from a chair using your arms (e.g.,  wheelchair or bedside chair)?: None Help needed to walk in hospital room?: None Help needed climbing 3-5 steps with a railing? : A Little 6 Click Score: 23      End of Session Equipment Utilized During Treatment: Gait belt Activity Tolerance: Patient tolerated treatment well Patient left: in bed;with call bell/phone within reach Nurse Communication: Mobility status PT Visit Diagnosis: Other abnormalities of gait and mobility (R26.89)     Time: 8980-8957 PT Time Calculation (min) (ACUTE ONLY): 23 min  Charges:   PT Evaluation $PT Eval Low Complexity: 1 Low PT Treatments $Gait Training: 8-22 mins    Jeremy Fritz, PT Acute Rehabilitation Services Office: [phone] 05/10/2024   Jeremy Fritz Jeremy Fritz  05/10/2024, 11:40 AM

## 2024-05-10 NOTE — Plan of Care (Signed)
  Problem: Nutrition: Goal: Adequate nutrition will be maintained Outcome: Progressing   Problem: Safety: Goal: Ability to remain free from injury will improve Outcome: Progressing   

## 2024-05-11 ENCOUNTER — Other Ambulatory Visit (HOSPITAL_COMMUNITY): Payer: Self-pay

## 2024-05-11 ENCOUNTER — Other Ambulatory Visit (HOSPITAL_BASED_OUTPATIENT_CLINIC_OR_DEPARTMENT_OTHER): Payer: Self-pay

## 2024-05-11 DIAGNOSIS — A419 Sepsis, unspecified organism: Secondary | ICD-10-CM | POA: Diagnosis not present

## 2024-05-11 DIAGNOSIS — J189 Pneumonia, unspecified organism: Secondary | ICD-10-CM | POA: Diagnosis not present

## 2024-05-11 LAB — BASIC METABOLIC PANEL WITH GFR
Anion gap: 10 (ref 5–15)
BUN: 19 mg/dL (ref 6–20)
CO2: 21 mmol/L — ABNORMAL LOW (ref 22–32)
Calcium: 8 mg/dL — ABNORMAL LOW (ref 8.9–10.3)
Chloride: 103 mmol/L (ref 98–111)
Creatinine, Ser: 1.03 mg/dL (ref 0.61–1.24)
GFR, Estimated: >60 mL/min (ref >60)
Glucose, Bld: 121 mg/dL — ABNORMAL HIGH (ref 70–99)
Potassium: 4 mmol/L (ref 3.5–5.1)
Sodium: 134 mmol/L — ABNORMAL LOW (ref 135–145)

## 2024-05-11 LAB — CBC WITH DIFFERENTIAL/PLATELET
Abs Immature Granulocytes: 0.05 K/uL (ref 0.00–0.07)
Basophils Absolute: 0.1 K/uL (ref 0.0–0.1)
Basophils Relative: 1 %
Eosinophils Absolute: 0.1 K/uL (ref 0.0–0.5)
Eosinophils Relative: 1 %
HCT: 45.6 % (ref 39.0–52.0)
Hemoglobin: 14.5 g/dL (ref 13.0–17.0)
Immature Granulocytes: 0 %
Lymphocytes Relative: 23 %
Lymphs Abs: 2.7 K/uL (ref 0.7–4.0)
MCH: 28.7 pg (ref 26.0–34.0)
MCHC: 31.8 g/dL (ref 30.0–36.0)
MCV: 90.3 fL (ref 80.0–100.0)
Monocytes Absolute: 0.8 K/uL (ref 0.1–1.0)
Monocytes Relative: 7 %
Neutro Abs: 8 K/uL — ABNORMAL HIGH (ref 1.7–7.7)
Neutrophils Relative %: 68 %
Platelets: 388 K/uL (ref 150–400)
RBC: 5.05 MIL/uL (ref 4.22–5.81)
RDW: 15.4 % (ref 11.5–15.5)
WBC: 11.8 K/uL — ABNORMAL HIGH (ref 4.0–10.5)
nRBC: 0 % (ref 0.0–0.2)

## 2024-05-11 LAB — MAGNESIUM: Magnesium: 2 mg/dL (ref 1.7–2.4)

## 2024-05-11 MED ORDER — SPIRONOLACTONE 25 MG PO TABS
25.0000 mg | ORAL_TABLET | Freq: Every day | ORAL | 2 refills | Status: AC
  Filled 2024-05-11: qty 30, 30d supply, fill #0
  Filled 2024-06-02 (×2): qty 30, 30d supply, fill #1

## 2024-05-11 MED ORDER — SODIUM CHLORIDE 0.9 % IV SOLN
1.0000 g | Freq: Once | INTRAVENOUS | Status: AC
  Administered 2024-05-11: 1 g via INTRAVENOUS
  Filled 2024-05-11: qty 10

## 2024-05-11 MED ORDER — SACUBITRIL-VALSARTAN 49-51 MG PO TABS
1.0000 | ORAL_TABLET | Freq: Two times a day (BID) | ORAL | 2 refills | Status: AC
  Filled 2024-05-11: qty 60, 30d supply, fill #0
  Filled 2024-06-02 (×2): qty 60, 30d supply, fill #1

## 2024-05-11 MED ORDER — CARVEDILOL 3.125 MG PO TABS
3.1250 mg | ORAL_TABLET | Freq: Two times a day (BID) | ORAL | Status: DC
  Administered 2024-05-11: 3.125 mg via ORAL
  Filled 2024-05-11: qty 1

## 2024-05-11 MED ORDER — ASPIRIN 81 MG PO TBEC
81.0000 mg | DELAYED_RELEASE_TABLET | Freq: Every day | ORAL | 2 refills | Status: AC
  Filled 2024-05-11: qty 30, 30d supply, fill #0
  Filled 2024-06-02 (×2): qty 30, 30d supply, fill #1

## 2024-05-11 MED ORDER — CARVEDILOL 3.125 MG PO TABS
3.1250 mg | ORAL_TABLET | Freq: Two times a day (BID) | ORAL | 2 refills | Status: AC
  Filled 2024-05-11: qty 30, 15d supply, fill #0
  Filled 2024-06-02 (×2): qty 30, 15d supply, fill #1
  Filled 2024-06-20: qty 30, 15d supply, fill #2

## 2024-05-11 MED ORDER — DAPAGLIFLOZIN PROPANEDIOL 10 MG PO TABS
10.0000 mg | ORAL_TABLET | Freq: Every day | ORAL | 2 refills | Status: AC
  Filled 2024-05-11: qty 30, 30d supply, fill #0
  Filled 2024-06-02 – 2024-06-23 (×4): qty 30, 30d supply, fill #1

## 2024-05-11 MED ORDER — ATORVASTATIN CALCIUM 40 MG PO TABS
40.0000 mg | ORAL_TABLET | Freq: Every day | ORAL | 2 refills | Status: AC
Start: 2024-05-11 — End: ?
  Filled 2024-05-11: qty 30, 30d supply, fill #0
  Filled 2024-06-02 (×2): qty 30, 30d supply, fill #1

## 2024-05-11 NOTE — Evaluation (Signed)
 Occupational Therapy Evaluation and Discharge Patient Details Name: Jeremy Fritz MRN: [id number] DOB: 03/11/1981 Today's Date: 05/11/2024   History of Present Illness   Jeremy Fritz is a 43 y.o. male admitted 05/07/24 with admit dx sepsis due to pneumonia with medical history significant for polysubstance use disorder (cocaine and THC), HFrEF, CVA, HTN, tobacco use disorder, HLD and OSA who presented to the ED for evaluation of cough, fever and leg swelling.     Clinical Impressions Pt is at Ind baseline level of function and safety with ADLs and ADL mobility, need no ADs. Pt reports L hand sensation impaired, weakness and having difficulty at times with [facility] tasks, strength WFL grossly. Pt states this is due to previous strokes and that he has had exercises in the past for L UE/hand. OT instructed pt on L hand grip strengthening and FMC exercises with blue squeeze ball, theraputty with handnout provided for other exercises. Pt educated on sensitization techniques for L hand, pt demos understanding, All education completed and no further acute OT services are indicated at this time, OT will sign off     If plan is discharge home, recommend the following:   Other (comment) (none)     Functional Status Assessment   Patient has not had a recent decline in their functional status     Equipment Recommendations   None recommended by OT     Recommendations for Other Services         Precautions/Restrictions   Precautions Precautions: Fall Recall of Precautions/Restrictions: Intact Restrictions Weight Bearing Restrictions Per Provider Order: No     Mobility Bed Mobility     Rolling: Independent              Transfers Overall transfer level: Independent Equipment used: None               General transfer comment: no deficits or LOB noted with transfers      Balance Overall balance assessment: Independent Sitting-balance support: Feet supported,  No upper extremity supported Sitting balance-Leahy Scale: Normal     Standing balance support: No upper extremity supported, During functional activity Standing balance-Leahy Scale: Good                             ADL either performed or assessed with clinical judgement   ADL Overall ADL's : Independent;At baseline                                       General ADL Comments: slow, guarded pace of mobility. No incoordination or LOB     Vision Baseline Vision/History: 0 No visual deficits Ability to See in Adequate Light: 0 Adequate Patient Visual Report: No change from baseline       Perception         Praxis         Pertinent Vitals/Pain Pain Assessment Pain Assessment: No/denies pain     Extremity/Trunk Assessment Upper Extremity Assessment Upper Extremity Assessment: Generalized weakness;LUE deficits/detail LUE Deficits / Details: pt reports L hand sensation impaired, weakness and having difficulty at times with [facility] tasks, strength WFL grossly. Pt states this is due to previous strokes and that he has jad exercises in the past for L UE/hand.   Lower Extremity Assessment Lower Extremity Assessment: Defer to PT evaluation   Cervical / Trunk Assessment Cervical /  Trunk Assessment: Normal   Communication Communication Communication: No apparent difficulties   Cognition Arousal: Alert Behavior During Therapy: WFL for tasks assessed/performed Cognition: No apparent impairments                               Following commands: Intact       Cueing  General Comments          Exercises Other Exercises Other Exercises: pt instructed on L hand grip strengthening and FMC exercises with blue squeeze ball, theraputty with handnout provided for other exercises. Pt educated on sensitization techniques for L hand, pt demos understanding   Shoulder Instructions      Home Living Family/patient expects to be discharged to::  Shelter/Homeless Living Arrangements: Alone                               Additional Comments: TOC working on Designer, multimedia      Prior Functioning/Environment               Mobility Comments: Ind ADLs Comments: Ind    OT Problem List: Decreased strength;Impaired sensation   OT Treatment/Interventions:        OT Goals(Current goals can be found in the care plan section)   Acute Rehab OT Goals Patient Stated Goal: feel better   OT Frequency:       Co-evaluation              AM-PAC OT 6 Clicks Daily Activity     Outcome Measure Help from another person eating meals?: None Help from another person taking care of personal grooming?: None Help from another person toileting, which includes using toliet, bedpan, or urinal?: None Help from another person bathing (including washing, rinsing, drying)?: None Help from another person to put on and taking off regular upper body clothing?: None Help from another person to put on and taking off regular lower body clothing?: None 6 Click Score: 24   End of Session Nurse Communication: Mobility status  Activity Tolerance: Patient tolerated treatment well Patient left: in bed  OT Visit Diagnosis: Muscle weakness (generalized) (M62.81);Other (comment) (L hand impaired senation and coordination)                Time: 8892-8864 OT Time Calculation (min): 28 min Charges:  OT General Charges $OT Visit: 1 Visit OT Evaluation $OT Eval Low Complexity: 1 Low OT Treatments $Therapeutic Activity: 8-22 mins   Jacques Karna Loose 05/11/2024, 1:40 PM

## 2024-05-11 NOTE — Hospital Course (Signed)
 Jeremy Fritz is a 43 y.o. male with a history of polysubstance use, heart failure with reduced EF, CVA, hypertension, tobacco use, hyperlipidemia, homelessness.  Patient presented secondary to fever, cough, leg swelling and found to have evidence of sepsis secondary to multifocal pneumonia in addition to acute heart failure.  Patient was managed on ceftriaxone  and azithromycin  for management of his pneumonia in addition to Lasix  for his heart failure.  Cardiology was consulted for assistance in management of heart failure with improvement.

## 2024-05-11 NOTE — Plan of Care (Signed)
  Problem: Education: Goal: Knowledge of General Education information will improve Description: Including pain rating scale, medication(s)/side effects and non-pharmacologic comfort measures Outcome: Progressing   Problem: Clinical Measurements: Goal: Will remain free from infection Outcome: Progressing Goal: Diagnostic test results will improve Outcome: Progressing   Problem: Nutrition: Goal: Adequate nutrition will be maintained Outcome: Progressing   Problem: Coping: Goal: Level of anxiety will decrease Outcome: Progressing   Problem: Elimination: Goal: Will not experience complications related to bowel motility Outcome: Progressing   Problem: Safety: Goal: Ability to remain free from injury will improve Outcome: Progressing   Problem: Respiratory: Goal: Ability to maintain adequate ventilation will improve Outcome: Progressing

## 2024-05-11 NOTE — Progress Notes (Signed)
  Progress Note  Patient Name: Jeremy Fritz Date of Encounter: 05/11/2024 [facility] Cardiologist: Soyla DELENA Merck, MD   Interval Summary   Patient reports feeling well this AM. Breathing has improved. No orthopnea. Had excellent urine output yesterday.   Vital Signs Vitals:   05/10/24 1209 05/10/24 2125 05/11/24 0500 05/11/24 0509  BP: (!) 133/97 (!) 137/96  (!) 111/97  Pulse: (!) 109 (!) 105  (!) 110  Resp: 18 18  16   Temp: 99.7 F (37.6 C) 98.5 F (36.9 C)  (!) 97.5 F (36.4 C)  TempSrc: Oral Oral  Oral  SpO2: 93% 95%  95%  Weight:   69.9 kg   Height:        Intake/Output Summary (Last 24 hours) at 05/11/2024 0806 Last data filed at 05/11/2024 0509 Gross per 24 hour  Intake 1672 ml  Output 3710 ml  Net -2038 ml      05/11/2024    5:00 AM 05/10/2024    5:00 AM 05/09/2024    4:19 AM  Last 3 Weights  Weight (lbs) 154 lb 158 lb 11.7 oz 168 lb 8 oz  Weight (kg) 69.854 kg 72 kg 76.431 kg      Telemetry/ECG  Sinus tachycardia with HR 100-110 - Personally Reviewed  Physical Exam  GEN: No acute distress.  Laying flat in the bed  Neck: No JVD Cardiac:  RRR, no murmurs, rubs, or gallops.  Respiratory: Clear to auscultation bilaterally. Normal WOB on room air  GI: Soft, nontender, non-distended  MS: No edema in BLE  Assessment & Plan   Acute on chronic systolic heart failure Moderate-severe MR  HTN  - Echocardiogram this admission showed EF 20%, moderately reduced RV systolic function, moderate-severe MR that appears to be functional due to severe ventricular dysfunction, small pericardial effusion   - Patient has been off his medications and continues to struggle with substance abuse  - He has been on IV lasix  40 mg daily- output 3.7 L urine yesterday. Renal function stable. Transitioned to po lasix   - Euvolemic on exam today. Continue PO lasix  40 mg daily  - If patient remains off cocaine, consider adding back carvedilol  at follow up appointment  -  Continue spironolactone  12.5 mg daily, farxiga  10 mg daily, entresto  49-51 mg BID  - Renal function stable, anticipate DC today. He has follow up with the heart failure TOC on 7/22   - Patient has family history of HF and CAD- father had a heart attack in his 48s. Patient has never had ischemic evaluation, consider this as an outpatient    Persistent Tachycardia  - HR remains in the low 100s. He is asymptomatic with this  - Likely due to recent cocaine use. No BB at this time, can possibly use carvedilol  in the future as above   History of CVA  - Resumed ASA 81 mg daily and lipitor 40 mg daily  - Repeat LFTs and lipids in 2-3 months    Polysubstance abuse  - Admits to daily use of cocaine and THC  - Patient is working with social work- planning to go to [facility] in [place] after discharge  For questions or updates, please contact [facility] Please consult [email or web address] for contact info under       Signed, Rollo FABIENE Louder, PA-C

## 2024-05-11 NOTE — Progress Notes (Signed)
 Discharge medications delivered to patient at bedside [facility]

## 2024-05-11 NOTE — Discharge Summary (Signed)
 Physician Discharge Summary   Patient: Jeremy Fritz MRN: [id number] DOB: Mar 28, 1981  Admit date:     05/07/2024  Discharge date: 05/11/24  Discharge Physician: Jeremy Lam, MD   PCP: Patient, No Pcp Per   Recommendations at discharge:  PCP/Cardiology visit for hospital follow-up Substance abuse rehab [facility])  Discharge Diagnoses: Principal Problem:   Sepsis due to pneumonia [facility]) Active Problems:   Uncontrolled hypertension   Polysubstance use disorder   Multifocal pneumonia   Acute on chronic systolic heart failure (HCC)  Resolved Problems:   * No resolved hospital problems. *  Hospital Course: WARNER Fritz is a 43 y.o. male with a history of polysubstance use, heart failure with reduced EF, CVA, hypertension, tobacco use, hyperlipidemia, homelessness.  Patient presented secondary to fever, cough, leg swelling and found to have evidence of sepsis secondary to multifocal pneumonia in addition to acute heart failure.  Patient was managed on ceftriaxone  and azithromycin  for management of his pneumonia in addition to Lasix  for his heart failure.  Cardiology was consulted for assistance in management of heart failure with improvement.  Assessment and Plan:  Sepsis Present on admission. Secondary to pneumonia. Blood cultures with no growth. Pneumonia treated with antibiotics.  Multifocal pneumonia Noted on CT imaging. Patient completed antibiotic therapy, consisting of Ceftriaxone  and azithromycin .   Acute on chronic HFrEF Patient started on Lasix  IV diuresis. Transthoracic Echocardiogram obtained this admission and significant for a worsened LVEF of 20% and associated RV with moderately reduced function. Cardiology consulted and continued IV diuresis with recommendations to continue Entresto , spironolactone  and Coreg . Cardiology recommendation for outpatient cardiac CT-A and outpatient follow-up. Weight of 154 lbs on day of discharge.  Primary hypertension Patient  nonadherent with medication regimen as an outpatient. Recommendation to resume home regimen on discharge with some adjustments. Patient to discharge on Coreg , Entresto , spironolactone .  Moderate/severe mitral valve regurgitation Noted on Transthoracic Echocardiogram. Follow-up with cardiology.  History of CVA Hyperlipidemia Stroke diagnosed in March 2024. Patient non-adherent with dual antiplatelet therapy. Discharge on aspirin  and Crestor.  Polysubstance abuse Noted. Patient seen by Child psychotherapist and plan for admission to [facility] once a bed is available.  Leukocytosis Mild. Stable.  Hypokalemia Likely related to Lasix  IV diuresis. Resolved with potassium supplementation. CT abdomen with diffuse gallbladder wall thickening; no associated abdominal pain to consider acute cholecystitis.  Elevated AST/ALT Unclear etiology. Mild and stable.  Aortic atherosclerosis Noted on CT imaging.   Consultants: Cardiology Procedures performed: Transthoracic Echocardiogram  Disposition: Home Diet recommendation: Cardiac diet   DISCHARGE MEDICATION: Allergies as of 05/11/2024   No Known Allergies      Medication List     STOP taking these medications    clopidogrel  75 MG tablet Commonly known as: PLAVIX    Entresto  24-26 MG Generic drug: sacubitril -valsartan  Replaced by: Entresto  49-51 MG   Jardiance  10 MG Tabs tablet Generic drug: empagliflozin        TAKE these medications    acetaminophen  325 MG tablet Commonly known as: TYLENOL  Take 1-2 tablets (325-650 mg total) by mouth every 4 (four) hours as needed for mild pain.   albuterol  108 (90 Base) MCG/ACT inhaler Commonly known as: VENTOLIN  HFA Inhale 2 puffs into the lungs every 6 (six) hours as needed for wheezing or shortness of breath.   aspirin  EC 81 MG tablet Take 1 tablet (81 mg total) by mouth daily. Swallow whole.   atorvastatin  40 MG tablet Commonly known as: LIPITOR Take 1 tablet (40 mg total) by mouth  daily.  carvedilol  3.125 MG tablet Commonly known as: COREG  Take 1 tablet (3.125 mg total) by mouth 2 (two) times daily with a meal. Start taking on: May 12, 2024 What changed:  medication strength how much to take   Entresto  49-51 MG Generic drug: sacubitril -valsartan  Take 1 tablet by mouth 2 (two) times daily. Replaces: Entresto  24-26 MG   Farxiga  10 MG Tabs tablet Generic drug: dapagliflozin  propanediol Take 1 tablet (10 mg total) by mouth daily. Start taking on: May 12, 2024   nicotine  polacrilex 2 MG gum Commonly known as: NICORETTE  Take 1 each (2 mg total) by mouth as needed for smoking cessation.   spironolactone  25 MG tablet Commonly known as: ALDACTONE  Take 1 tablet (25 mg total) by mouth daily. Start taking on: May 12, 2024 What changed: how much to take   traZODone  50 MG tablet Commonly known as: DESYREL  Take 0.5 tablets (25 mg total) by mouth at bedtime as needed for sleep.        Follow-up Information     [facility]. Go in 6 day(s).   Specialty: Cardiology Why: Hospital follow up 05/17/2024 @ 2:00 pm PLEASE bring a current medication list to appointment FREE valet parking, Entrance C, off [employer] [facility] [employer] entrance Contact information: [address] [place]  [phone]               Discharge Exam: BP 119/86 (BP Location: Left Arm)   Pulse (!) 105   Temp 98.2 F (36.8 C)   Resp 16   Ht 6' (1.829 m)   Wt 69.9 kg   SpO2 100%   BMI 20.89 kg/m   General exam: Appears calm and comfortable Respiratory system: Clear to auscultation. Respiratory effort normal. Cardiovascular system: S1 & S2 heard, RRR. No murmurs. Gastrointestinal system: Abdomen is nondistended, soft and nontender. Normal bowel sounds heard. Central nervous system: Alert and oriented. No focal neurological deficits. Psychiatry: Judgement and insight  appear normal. Mood & affect appropriate.   Condition at discharge: stable  The results of significant diagnostics from this hospitalization (including imaging, microbiology, ancillary and laboratory) are listed below for reference.   Imaging Studies: ECHOCARDIOGRAM COMPLETE Result Date: 05/08/2024    ECHOCARDIOGRAM REPORT   Patient Name:   MAKAYLA CONFER Biever Date of Exam: 05/08/2024 Medical Rec #:  [id number]       Height:       72.0 in Accession #:    [id number]      Weight:       154.3 lb Date of Birth:  07-18-1981      BSA:          1.908 m Patient Age:    42 years        BP: Patient Gender: M               HR: Exam Location:  Inpatient Procedure: 2D Echo (Both Spectral and Color Flow Doppler were utilized during            procedure).  Referring Phys: [id number] [facility] IMPRESSIONS  1. Left ventricular ejection fraction, by estimation, is 20%. The left ventricle demonstrates global hypokinesis. The left ventricular internal cavity size was severely dilated.  2. Only basal portion of RV contracts well.. Right ventricular systolic function is moderately reduced. The right ventricular size is normal.  3. Left atrial size was mild to moderately dilated.  4. Right atrial size was mildly dilated.  5. MR appears functional due to severe ventricular dysfunction/dilation.. The mitral valve is normal in structure. Moderate to severe mitral valve regurgitation.  6. The aortic valve is tricuspid. Aortic valve regurgitation is not visualized.  7. The inferior vena cava is normal in size with greater than 50% respiratory variability, suggesting right atrial pressure of 3 mmHg. FINDINGS  Left Ventricle: Left ventricular ejection fraction, by estimation, is 20%. The left ventricle demonstrates global hypokinesis. The left ventricular internal cavity size was severely dilated. There is no left ventricular hypertrophy. Right Ventricle: Only basal portion of RV contracts well. The right ventricular size is normal. Right  vetricular wall thickness was not assessed. Right ventricular systolic function is moderately reduced. Left Atrium: Left atrial size was mild to moderately dilated. Right Atrium: Right atrial size was mildly dilated. Pericardium: There is no evidence of pericardial effusion. Mitral Valve: MR appears functional due to severe ventricular dysfunction/dilation. The mitral valve is normal in structure. Moderate to severe mitral valve regurgitation. Tricuspid Valve: The tricuspid valve is normal in structure. Tricuspid valve regurgitation is mild. Aortic Valve: The aortic valve is tricuspid. Aortic valve regurgitation is not visualized. Pulmonic Valve: The pulmonic valve was normal in structure. Pulmonic valve regurgitation is mild. Aorta: The aortic root and ascending aorta are structurally normal, with no evidence of dilitation. Venous: The inferior vena cava is normal in size with greater than 50% respiratory variability, suggesting right atrial pressure of 3 mmHg. IAS/Shunts: No atrial level shunt detected by color flow Doppler.  LEFT VENTRICLE PLAX 2D LVIDd:         6.60 cm      Diastology LVIDs:         6.10 cm      LV e' medial:    4.57 cm/s LV PW:         0.90 cm      LV E/e' medial:  25.8 LV IVS:        0.80 cm      LV e' lateral:   4.13 cm/s LVOT diam:     2.40 cm      LV E/e' lateral: 28.6 LV SV:         50 LV SV Index:   26 LVOT Area:     4.52 cm  LV Volumes (MOD) LV vol d, MOD A2C: 277.0 ml LV vol d, MOD A4C: 244.0 ml LV vol s, MOD A2C: 237.0 ml LV vol s, MOD A4C: 180.0 ml LV SV MOD A2C:     40.0 ml LV SV MOD A4C:     244.0 ml LV SV MOD BP:      51.7 ml RIGHT VENTRICLE             IVC RV S prime:     11.10 cm/s  IVC diam: 2.00 cm TAPSE (M-mode): 1.5 cm LEFT ATRIUM           Index        RIGHT ATRIUM           Index LA diam:      4.60 cm 2.41 cm/m   RA Area:     19.10 cm LA Vol (A4C): 79.0 ml 41.40 ml/m  RA Volume:   57.80 ml  30.29 ml/m  AORTIC VALVE             PULMONIC VALVE LVOT Vmax:   78.30 cm/s  PR  End Diast Vel: 16.40 msec LVOT Vmean:  54.300 cm/s LVOT VTI:    0.110  m  AORTA Ao Root diam: 3.00 cm Ao Asc diam:  3.10 cm MITRAL VALVE                  TRICUSPID VALVE MV Area (PHT): 5.93 cm       TR Peak grad:   32.3 mmHg MV Decel Time: 128 msec       TR Vmax:        284.00 cm/s MR Peak grad:    105.3 mmHg MR Mean grad:    72.0 mmHg    SHUNTS MR Vmax:         513.00 cm/s  Systemic VTI:  0.11 m MR Vmean:        399.0 cm/s   Systemic Diam: 2.40 cm MR PISA:         2.26 cm MR PISA Eff ROA: 41 mm MR PISA Radius:  0.60 cm MV E velocity: 118.00 cm/s Vina Gull MD Electronically signed by Vina Gull MD Signature Date/Time: 05/08/2024/6:36:47 PM    Final    CT ABDOMEN PELVIS W CONTRAST Result Date: 05/07/2024 CLINICAL DATA:  Sepsis. EXAM: CT ABDOMEN AND PELVIS WITH CONTRAST TECHNIQUE: Multidetector CT imaging of the abdomen and pelvis was performed using the standard protocol following bolus administration of intravenous contrast. RADIATION DOSE REDUCTION: This exam was performed according to the departmental dose-optimization program which includes automated exposure control, adjustment of the mA and/or kV according to patient size and/or use of iterative reconstruction technique. CONTRAST:  OMNIPAQUE  IOHEXOL  300 MG/ML  SOLN COMPARISON:  No prior abdominal imaging. Chest radiograph earlier today reviewed FINDINGS: Lower chest: The heart is enlarged. Small bilateral pleural effusions, right greater than left. Multifocal consolidative and nodular opacities in both lungs, greatest in the right lower lobe. Hepatobiliary: Prominent periportal edema. There is no evidence of focal liver lesion. Diffuse gallbladder wall thickening. No calcified gallstone. Biliary tree is poorly defined on the current exam. Pancreas: No ductal dilatation or inflammation. Spleen: 7 mm low-density in the inferior spleen, too small to characterize. Adrenals/Urinary Tract: Normal adrenal glands. No hydronephrosis. Mild heterogeneous  enhancement involving the left kidney, series 12, image 33. Early excretion of IV contrast in the renal collecting systems. Bilateral renal cysts. No further follow-up imaging is recommended. Urinary bladder is completely decompressed and not well assessed. Stomach/Bowel: Bowel assessment is limited in the absence of enteric contrast and paucity of intra-abdominal fat. No bowel obstruction or abnormal distention. There is no obvious bowel wall thickening. Small volume of formed stool in the colon. The appendix is not definitively visualized, regardless, no evidence of appendicitis. Vascular/Lymphatic: Mild aortic atherosclerosis. No aneurysm. The IVC is prominent in diameter. Portal vein is patent. No bulky adenopathy. Reproductive: Prostate is unremarkable. Other: Generalized edema of the subcutaneous and intra-abdominal fat. No definite ascites. No free air. Musculoskeletal: There are no acute or suspicious osseous abnormalities. IMPRESSION: 1. Multifocal consolidative and nodular opacities in both lung basess, suspicious for multifocal pneumonia. 2. Small bilateral pleural effusions, right greater than left. 3. Cardiomegaly. 4. Prominent periportal edema and diffuse gallbladder wall thickening, likely related to volume overload. 5. Mild heterogeneous enhancement involving the left kidney, can be seen with pyelonephritis. Recommend correlation with urinalysis. 6. Generalized edema of the subcutaneous and intra-abdominal fat suggestive of third spacing. Aortic Atherosclerosis (ICD10-I70.0). Electronically Signed   By: Andrea Gasman M.D.   On: 05/07/2024 18:09   DG Chest Port 1 View Result Date: 05/07/2024 CLINICAL DATA:  Questionable sepsis - evaluate for abnormality EXAM: PORTABLE CHEST 1  VIEW COMPARISON:  January 08, 2023 FINDINGS: The cardiomediastinal silhouette is enlarged in contour. No pleural effusion. No pneumothorax. Bilateral perihilar predominant reticulonodular airspace opacities. Mild  peribronchial cuffing. Mild interstitial prominence. IMPRESSION: Bilateral perihilar predominant reticulonodular airspace opacities with peribronchial cuffing. Findings are favored to reflect pulmonary edema versus atypical infection. Electronically Signed   By: Corean Salter M.D.   On: 05/07/2024 16:50    Microbiology: Results for orders placed or performed during the hospital encounter of 05/07/24  Blood Culture (routine x 2)     Status: None (Preliminary result)   Collection Time: 05/07/24  3:55 PM   Specimen: BLOOD LEFT ARM  Result Value Ref Range Status   Specimen Description   Final    BLOOD LEFT ARM Performed at [facility], [address] [place], [place] [address]    Special Requests   Final    BOTTLES DRAWN AEROBIC AND ANAEROBIC Blood Culture adequate volume Performed at [facility], [address], [place], [place] [address]    Culture   Final    NO GROWTH 4 DAYS Performed at [facility], [address], [place], [place] [address]    Report Status PENDING  Incomplete  Resp panel by RT-PCR (RSV, Flu A&B, Covid) Peripheral     Status: None   Collection Time: 05/07/24  4:07 PM   Specimen: Peripheral; Nasal Swab  Result Value Ref Range Status   SARS Coronavirus 2 by RT PCR NEGATIVE NEGATIVE Final    Comment: (NOTE) SARS-CoV-2 target nucleic acids are NOT DETECTED.  The SARS-CoV-2 RNA is generally detectable in upper respiratory specimens during the acute phase of infection. The lowest concentration of SARS-CoV-2 viral copies this assay can detect is 138 copies/mL. A negative result does not preclude SARS-Cov-2 infection and should not be used as the sole basis for treatment or other patient management decisions. A negative result may occur with  improper specimen collection/handling, submission of specimen other than nasopharyngeal swab, presence of viral mutation(s) within the areas targeted by this assay, and inadequate number of  viral copies(<138 copies/mL). A negative result must be combined with clinical observations, patient history, and epidemiological information. The expected result is Negative.  Fact Sheet for Patients:  BloggerCourse.com  Fact Sheet for Healthcare Providers:  SeriousBroker.it  This test is no t yet approved or cleared by the [place]  FDA and  has been authorized for detection and/or diagnosis of SARS-CoV-2 by FDA under an Emergency Use Authorization (EUA). This EUA will remain  in effect (meaning this test can be used) for the duration of the COVID-19 declaration under Section 564(b)(1) of the Act, 21 U.S.C.section 360bbb-3(b)(1), unless the authorization is terminated  or revoked sooner.       Influenza A by PCR NEGATIVE NEGATIVE Final   Influenza B by PCR NEGATIVE NEGATIVE Final    Comment: (NOTE) The Xpert Xpress SARS-CoV-2/FLU/RSV plus assay is intended as an aid in the diagnosis of influenza from Nasopharyngeal swab specimens and should not be used as a sole basis for treatment. Nasal washings and aspirates are unacceptable for Xpert Xpress SARS-CoV-2/FLU/RSV testing.  Fact Sheet for Patients: BloggerCourse.com  Fact Sheet for Healthcare Providers: SeriousBroker.it  This test is not yet approved or cleared by the [place]  FDA and has been authorized for detection and/or diagnosis of SARS-CoV-2 by FDA under an Emergency Use Authorization (EUA). This EUA will remain in effect (meaning this test can be used) for the duration of the COVID-19 declaration under  Section 564(b)(1) of the Act, 21 U.S.C. section 360bbb-3(b)(1), unless the authorization is terminated or revoked.     Resp Syncytial Virus by PCR NEGATIVE NEGATIVE Final    Comment: (NOTE) Fact Sheet for Patients: BloggerCourse.com  Fact Sheet for Healthcare  Providers: SeriousBroker.it  This test is not yet approved or cleared by the [place]  FDA and has been authorized for detection and/or diagnosis of SARS-CoV-2 by FDA under an Emergency Use Authorization (EUA). This EUA will remain in effect (meaning this test can be used) for the duration of the COVID-19 declaration under Section 564(b)(1) of the Act, 21 U.S.C. section 360bbb-3(b)(1), unless the authorization is terminated or revoked.  Performed at [facility], [address] [place], [place] [address]   Blood Culture (routine x 2)     Status: None (Preliminary result)   Collection Time: 05/07/24  9:08 PM   Specimen: BLOOD LEFT ARM  Result Value Ref Range Status   Specimen Description   Final    BLOOD LEFT ARM Performed at [facility], [address], [place], [place] [address]    Special Requests   Final    BOTTLES DRAWN AEROBIC AND ANAEROBIC Blood Culture adequate volume Performed at [facility], [address] [place], [place] [address]    Culture   Final    NO GROWTH 4 DAYS Performed at [facility], [address], [place], [place] [address]    Report Status PENDING  Incomplete    Labs: CBC: Recent Labs  Lab 05/07/24 1609 05/08/24 0811 05/09/24 0519 05/10/24 0535 05/11/24 0549  WBC 11.5* 13.8* 11.0* 11.5* 11.8*  NEUTROABS 7.9*  --  8.1* 8.6* 8.0*  HGB 12.6* 15.3 13.9 14.3 14.5  HCT 37.5* 46.2 41.4 42.5 45.6  MCV 90.1 89.2 89.6 88.2 90.3  PLT 405* 347 337 391 388   Basic Metabolic Panel: Recent Labs  Lab 05/07/24 1609 05/08/24 0811 05/09/24 0519 05/10/24 0535 05/11/24 0549  NA 136 137 137 135 134*  K 4.5 4.2 3.3* 3.9 4.0  CL 105 104 104 104 103  CO2 22 22 25 24  21*  GLUCOSE 115* 140* 167* 114* 121*  BUN 18 15 18 17 19   CREATININE 1.07 1.03 1.14 0.94 1.03  CALCIUM  8.0* 7.9* 7.7* 8.2* 8.0*  MG  --  1.8 1.8 1.9 2.0   Liver Function Tests: Recent Labs  Lab  05/07/24 1609 05/09/24 0519  AST 46* 50*  ALT 45* 71*  ALKPHOS 76 71  BILITOT 1.4* 0.6  PROT 6.3* 5.0*  ALBUMIN 2.6* 1.9*    Discharge time spent: 35 minutes.  Signed: Elgin Lam, MD [facility] 05/11/2024

## 2024-05-12 ENCOUNTER — Other Ambulatory Visit (HOSPITAL_COMMUNITY): Payer: Self-pay

## 2024-05-12 LAB — CULTURE, BLOOD (ROUTINE X 2)
Culture: NO GROWTH
Culture: NO GROWTH
Special Requests: ADEQUATE
Special Requests: ADEQUATE

## 2024-05-17 ENCOUNTER — Encounter (HOSPITAL_COMMUNITY): Payer: Self-pay

## 2024-05-17 ENCOUNTER — Other Ambulatory Visit (HOSPITAL_COMMUNITY): Payer: Self-pay

## 2024-05-17 ENCOUNTER — Ambulatory Visit (HOSPITAL_COMMUNITY): Payer: Self-pay | Admitting: Adult Health

## 2024-05-17 ENCOUNTER — Ambulatory Visit (HOSPITAL_COMMUNITY)
Admit: 2024-05-17 | Discharge: 2024-05-17 | Disposition: A | Discharge status: Home / Self Care | Source: Ambulatory Visit | Attending: Adult Health | Admitting: Adult Health

## 2024-05-17 VITALS — BP 118/68 | HR 102 | Ht 72.0 in | Wt 166.0 lb

## 2024-05-17 DIAGNOSIS — F17210 Nicotine dependence, cigarettes, uncomplicated: Secondary | ICD-10-CM | POA: Diagnosis not present

## 2024-05-17 DIAGNOSIS — I110 Hypertensive heart disease with heart failure: Secondary | ICD-10-CM | POA: Diagnosis not present

## 2024-05-17 DIAGNOSIS — R0601 Orthopnea: Secondary | ICD-10-CM | POA: Insufficient documentation

## 2024-05-17 DIAGNOSIS — I5042 Chronic combined systolic (congestive) and diastolic (congestive) heart failure: Secondary | ICD-10-CM | POA: Diagnosis not present

## 2024-05-17 DIAGNOSIS — Z79899 Other long term (current) drug therapy: Secondary | ICD-10-CM | POA: Diagnosis not present

## 2024-05-17 DIAGNOSIS — Z8249 Family history of ischemic heart disease and other diseases of the circulatory system: Secondary | ICD-10-CM | POA: Diagnosis not present

## 2024-05-17 DIAGNOSIS — I5082 Biventricular heart failure: Secondary | ICD-10-CM | POA: Insufficient documentation

## 2024-05-17 DIAGNOSIS — Z716 Tobacco abuse counseling: Secondary | ICD-10-CM | POA: Diagnosis not present

## 2024-05-17 DIAGNOSIS — F1410 Cocaine abuse, uncomplicated: Secondary | ICD-10-CM | POA: Diagnosis not present

## 2024-05-17 DIAGNOSIS — E785 Hyperlipidemia, unspecified: Secondary | ICD-10-CM | POA: Diagnosis not present

## 2024-05-17 DIAGNOSIS — I5022 Chronic systolic (congestive) heart failure: Secondary | ICD-10-CM | POA: Diagnosis present

## 2024-05-17 DIAGNOSIS — I10 Essential (primary) hypertension: Secondary | ICD-10-CM

## 2024-05-17 DIAGNOSIS — F1910 Other psychoactive substance abuse, uncomplicated: Secondary | ICD-10-CM | POA: Diagnosis not present

## 2024-05-17 DIAGNOSIS — Z8673 Personal history of transient ischemic attack (TIA), and cerebral infarction without residual deficits: Secondary | ICD-10-CM | POA: Insufficient documentation

## 2024-05-17 LAB — BASIC METABOLIC PANEL WITH GFR
Anion gap: 11 (ref 5–15)
BUN: 20 mg/dL (ref 6–20)
CO2: 21 mmol/L — ABNORMAL LOW (ref 22–32)
Calcium: 8.4 mg/dL — ABNORMAL LOW (ref 8.9–10.3)
Chloride: 104 mmol/L (ref 98–111)
Creatinine, Ser: 1.09 mg/dL (ref 0.61–1.24)
GFR, Estimated: >60 mL/min (ref >60)
Glucose, Bld: 97 mg/dL (ref 70–99)
Potassium: 4.2 mmol/L (ref 3.5–5.1)
Sodium: 136 mmol/L (ref 135–145)

## 2024-05-17 LAB — BRAIN NATRIURETIC PEPTIDE: B Natriuretic Peptide: 733.9 pg/mL — ABNORMAL HIGH (ref 0.0–100.0)

## 2024-05-17 MED ORDER — DIGOXIN 125 MCG PO TABS
0.1250 mg | ORAL_TABLET | Freq: Every day | ORAL | 5 refills | Status: AC
  Filled 2024-05-17: qty 30, 30d supply, fill #0
  Filled 2024-06-20: qty 30, 30d supply, fill #1
  Filled 2024-07-22: qty 30, 30d supply, fill #2

## 2024-05-17 MED ORDER — FUROSEMIDE 20 MG PO TABS
20.0000 mg | ORAL_TABLET | Freq: Every day | ORAL | 3 refills | Status: AC
  Filled 2024-05-17: qty 30, 30d supply, fill #0
  Filled 2024-06-20: qty 30, 30d supply, fill #1
  Filled 2024-07-22: qty 30, 30d supply, fill #2

## 2024-05-17 NOTE — Patient Instructions (Addendum)
 Medication Changes:  START LASIX  (FUROSEMIDE ) 20MG  ONCE DAILY   START DIGOXIN  0.125MG  ONCE DAILY   Lab Work:  Labs done today, your results will be available in MyChart, we will contact you for abnormal readings.  Follow-Up in: 4 weeks as scheduled   At the [facility], you and your health needs are our priority. We have a designated team specialized in the treatment of Heart Failure. This Care Team includes your primary Heart Failure Specialized Cardiologist (physician), Advanced Practice Providers (APPs- Physician Assistants and Nurse Practitioners), and Pharmacist who all work together to provide you with the care you need, when you need it.   You may see any of the following providers on your designated Care Team at your next follow up:  Dr. Toribio Fuel Dr. Ezra Shuck Dr. Ria Commander Dr. Odis Brownie Greig Mosses, NP Caffie Shed, [place] [facility], [place] Beckey Coe, NP [place] Lee, NP Tinnie Redman, PharmD   Please be sure to bring in all your medications bottles to every appointment.   Need to Contact Us :  If you have any questions or concerns before your next appointment please send us  a message through [place] or call our office at [phone].    TO LEAVE A MESSAGE FOR THE NURSE SELECT OPTION 2, PLEASE LEAVE A MESSAGE INCLUDING: YOUR NAME DATE OF BIRTH CALL BACK NUMBER REASON FOR CALL**this is important as we prioritize the call backs  YOU WILL RECEIVE A CALL BACK THE SAME DAY AS LONG AS YOU CALL BEFORE 4:00 PM

## 2024-05-17 NOTE — Progress Notes (Signed)
 HEART & VASCULAR TRANSITION OF CARE CONSULT NOTE     Referring Physician: Dr Raford Patient, No Pcp Per   Chief Complaint: Heart Failure   HPI: Referred to clinic by Dr Raford for heart failure consultation.   Jeremy Fritz is a 43 y.o. male with a history of biventricular heart failure 2024, HTN, CVA, severe MR, HLD, and polysubstance abuse. Several family members have heart failure. Brother died of heart disease and father had heart attack in his 94s. He has not had ischemic work up.   He was in prison from 2016-2024. Soon after his release from prison he had stroke. Stroke was felt to be secondary to cocaine. He went back to prison and got released again and unfortunately he had another stroke and he had run out ot medications.   Admitted 05/07/2024 with sepsis in the setting of pneumonia. Hospital course complicated by A/C biventricular heart failure and uncontrolled hypertension. On admission he had been out of his medications for several months. Cardiology consulted. Diuresed with IV lasix . Discharged on coreg , entresto , farxiga , and spironolactone .   Complaining of shortness of breath with exertion since discharge.  SOB with exertion. + orthopnea. Denies PND.  Appetite ok. No fever or chills. Taking all medications. Currently living with his Mother. Last used cocaine 05/07/24. Rarely smoking cigarettes. Denies alcohol . He plans to go [place] to a substance abuse center in the next few days.   Cardiac Testing  Echo 04/2024  1. Left ventricular ejection fraction, by estimation, is 20%. The left  ventricle demonstrates global hypokinesis. The left ventricular internal  cavity size was severely dilated.   2. Only basal portion of RV contracts well.. Right ventricular systolic  function is moderately reduced. The right ventricular size is normal.   3. Left atrial size was mild to moderately dilated.   4. Right atrial size was mildly dilated.   5. MR appears functional due  to severe ventricular dysfunction/dilation..  The mitral valve is normal in structure. Moderate to severe mitral valve  regurgitation.   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     aspirin  EC 81 MG tablet Take 1 tablet (81 mg total) by mouth daily. Swallow whole. 30 tablet 2   atorvastatin  (LIPITOR) 40 MG tablet Take 1 tablet (40 mg total) by mouth daily. 30 tablet 2   carvedilol  (COREG ) 3.125 MG tablet Take 1 tablet (3.125 mg total) by mouth 2 (two) times daily with a meal. 30 tablet 2   dapagliflozin  propanediol (FARXIGA ) 10 MG TABS tablet Take 1 tablet (10 mg total) by mouth daily. 30 tablet 2   sacubitril -valsartan  (ENTRESTO ) 49-51 MG Take 1 tablet by mouth 2 (two) times daily. 60 tablet 2   spironolactone  (ALDACTONE ) 25 MG tablet Take 1 tablet (25 mg total) by mouth daily. 30 tablet 2   albuterol  (VENTOLIN  HFA) 108 (90  Base) MCG/ACT inhaler Inhale 2 puffs into the lungs every 6 (six) hours as needed for wheezing or shortness of breath. (Patient not taking: Reported on 05/17/2024)     nicotine  polacrilex (NICORETTE ) 2 MG gum Take 1 each (2 mg total) by mouth as needed for smoking cessation. (Patient not taking: Reported on 05/17/2024) 100 tablet 0   traZODone  (DESYREL ) 50 MG tablet Take 0.5 tablets (25 mg total) by mouth at bedtime as needed for sleep. (Patient not taking: Reported on 05/17/2024) 15 tablet 0   No current facility-administered medications for  this encounter.    No Known Allergies    Social History   Socioeconomic History   Marital status: Single    Spouse name: Not on file   Number of children: 0   Years of education: Not on file   Highest education level: High school graduate  Occupational History   Occupation: Holiday representative   Occupation: not working.  Tobacco Use   Smoking status: Every Day    Current packs/day: 0.50    Average packs/day: 0.5 packs/day for 12.0 years (6.0 ttl pk-yrs)    Types: Cigarettes   Smokeless tobacco: Not on file  Vaping Use   Vaping status: Former   Substances: Flavoring  Substance and Sexual Activity   Alcohol  use: Not Currently    Alcohol /week: 24.0 standard drinks of alcohol     Types: 24 Cans of beer per week   Drug use: Yes    Types: Cocaine, Marijuana, Crack cocaine    Comment: 2days before admission   Sexual activity: Yes  Other Topics Concern   Not on file  Social History Narrative   Not on file   Social Drivers of Health   Financial Resource Strain: High Risk (05/10/2024)   Overall Financial Resource Strain (CARDIA)    Difficulty of Paying Living Expenses: Hard  Food Insecurity: Food Insecurity Present (05/10/2024)   Hunger Vital Sign    Worried About Running Out of Food in the Last Year: Sometimes true    Ran Out of Food in the Last Year: Sometimes true  Transportation Needs: No Transportation Needs (05/10/2024)   PRAPARE - Administrator, Civil Service (Medical): No    Lack of Transportation (Non-Medical): No  Physical Activity: Not on file  Stress: Not on file  Social Connections: Not on file  Intimate Partner Violence: Not At Risk (05/07/2024)   Humiliation, Afraid, Rape, and Kick questionnaire    Fear of Current or Ex-Partner: No    Emotionally Abused: No    Physically Abused: No    Sexually Abused: No      Family History  Problem Relation Age of Onset   Hyperlipidemia Mother    Hypertension Mother    Heart failure Father     Vitals:    05/17/24 1339  BP: 118/68  Pulse: (!) 102  SpO2: 98%  Weight: 75.3 kg (166 lb)  Height: 6' (1.829 m)   Wt Readings from Last 3 Encounters:  05/17/24 75.3 kg (166 lb)  05/11/24 69.9 kg (154 lb)  01/10/23 68.8 kg (151 lb 10.8 oz)    PHYSICAL EXAM: General:   No resp difficulty Neck: JVP 8-9   Cor: Tachy Regular rate & rhythm. Lungs: clear Abdomen: soft, nontender, nondistended.  Extremities: R and LLE 1+  edema Neuro: alert & oriented x3   ASSESSMENT & PLAN: 1. Chronic Biventricular HFrEF Echo 04/2024 LVEF 20% RV moderately reduced. He has not had ischemic work up. Suspect in the setting of  cocaine, familial component, and uncontrolled hypertension.   ReDs reading: 27 %, normal.  NYHA III. Mild volume overload. Suspect some of his symptoms are related to recent pneumonia.   GDMT  Diuretic- Add lasix  20 mg daily.  Add digoxin  0.125 mg daily  BB-Continue coreg  3.125 mg twice a day Ace/ARB/ARNI- Continue entresto  49-51 mg twice a day.  MRA- Continue spironolactone  25 mg daily SGLT2i- Continue farxiga  10 mg daily  Check BMET  Stressed importance of medication compliance. Ideally we would absorb him into the Advanced Heart Failure Clinic. He is not a candidate for advanced therapies with cocaine use. Hopefully his program will help him.   2. Hypertension BP stable.   3.CVA 2024 x2. Per neurology suspected to be from cocaine abuse. On statin and plavix .    3. Tobacco Abuse Discussed smoking cessation   4. Polysubstance Abuse  Plans to go to substance abuse center in [place].   Referred to HFSW (PCP, Medications, Transportation, ETOH Abuse, Drug Abuse, Insurance, Financial ):  No. He plans to go to voluntary substance abuse center.  Refer to Pharmacy: No Refer to Home Health:  No Refer to Advanced Heart Failure Clinic: Yes Refer to General Cardiology: No  Medicaid is no longer active. He needs to go to [facility] office today and hopefully get re certified.   We sent  digoxin  and lasix  through the HF fund.   Follow up  4 weeks with HF MD.   Greig Mosses NP-C  1:50 PM

## 2024-05-18 ENCOUNTER — Telehealth: Payer: Self-pay | Admitting: Licensed Clinical Social Worker

## 2024-05-18 NOTE — Telephone Encounter (Signed)
 H&V Care Navigation CSW Progress Note  Clinical Social Worker contacted patient by phone to f/u on message received from RN and pharmacy team at [facility] regarding pt medication coverage lapsing and Medicaid not showing as active. Pt had been advised to go to [facility] enrollment center to review options.   Called to see if pt was able to complete visit with Medicaid caseworkers or any additional resources needed. Pt mother answered at [phone], no DPR on file. Introduced self, and asked her to have pt reach out to me as able. She took down my name and contact number. Remain available should pt return my call.  Patient is participating in a Managed Medicaid Plan:  No, self pay only  SDOH Screenings   Food Insecurity: Food Insecurity Present (05/10/2024)  Housing: High Risk (05/10/2024)  Transportation Needs: No Transportation Needs (05/10/2024)  Utilities: Not At Risk (05/07/2024)  Alcohol  Screen: Low Risk  (05/10/2024)  Financial Resource Strain: High Risk (05/10/2024)  Tobacco Use: High Risk (05/17/2024)    Marit Lark, MSW, LCSW Clinical Social Worker II [facility] Heart/Vascular Care Navigation  [phone]- work cell phone (preferred)

## 2024-06-02 ENCOUNTER — Other Ambulatory Visit: Payer: Self-pay

## 2024-06-02 ENCOUNTER — Encounter: Payer: Self-pay | Admitting: Pharmacist

## 2024-06-02 ENCOUNTER — Other Ambulatory Visit (HOSPITAL_COMMUNITY): Payer: Self-pay

## 2024-06-03 ENCOUNTER — Other Ambulatory Visit (HOSPITAL_COMMUNITY): Payer: Self-pay

## 2024-06-06 ENCOUNTER — Other Ambulatory Visit (HOSPITAL_COMMUNITY): Payer: Self-pay

## 2024-06-09 ENCOUNTER — Encounter (HOSPITAL_COMMUNITY): Payer: Self-pay

## 2024-06-09 ENCOUNTER — Other Ambulatory Visit (HOSPITAL_COMMUNITY): Payer: Self-pay

## 2024-06-09 ENCOUNTER — Telehealth (HOSPITAL_COMMUNITY): Payer: Self-pay | Admitting: Pharmacy Technician

## 2024-06-10 ENCOUNTER — Telehealth (HOSPITAL_COMMUNITY): Payer: Self-pay | Admitting: Pharmacy Technician

## 2024-06-10 ENCOUNTER — Other Ambulatory Visit (HOSPITAL_COMMUNITY): Payer: Self-pay

## 2024-06-10 NOTE — Telephone Encounter (Signed)
 Pharmacy Patient Advocate Encounter  Received notification from [place] MEDICA[employer] that Prior Authorization for Farxiga 10 mg has been APPROVED from 06/10/2024 to 06/10/2025   PA #/Case ID/Reference #: [id number] W

## 2024-06-15 ENCOUNTER — Encounter (HOSPITAL_COMMUNITY): Admitting: Cardiology

## 2024-06-20 ENCOUNTER — Other Ambulatory Visit (HOSPITAL_COMMUNITY): Payer: Self-pay

## 2024-06-20 ENCOUNTER — Ambulatory Visit (HOSPITAL_COMMUNITY)
Admission: EM | Admit: 2024-06-20 | Discharge: 2024-06-20 | Disposition: A | Discharge status: Home / Self Care | Attending: Psychiatry | Admitting: Psychiatry

## 2024-06-20 DIAGNOSIS — F418 Other specified anxiety disorders: Secondary | ICD-10-CM | POA: Insufficient documentation

## 2024-06-20 DIAGNOSIS — F419 Anxiety disorder, unspecified: Secondary | ICD-10-CM

## 2024-06-20 DIAGNOSIS — Z560 Unemployment, unspecified: Secondary | ICD-10-CM | POA: Insufficient documentation

## 2024-06-20 DIAGNOSIS — F1420 Cocaine dependence, uncomplicated: Secondary | ICD-10-CM | POA: Insufficient documentation

## 2024-06-20 DIAGNOSIS — F1290 Cannabis use, unspecified, uncomplicated: Secondary | ICD-10-CM | POA: Insufficient documentation

## 2024-06-20 NOTE — ED Provider Notes (Signed)
 Behavioral Health Urgent Care Medical Screening Exam  Patient Name: Jeremy Fritz MRN: [id number] Date of Evaluation: 06/20/24 Chief Complaint:  need medical clearance  Diagnosis:  Final diagnoses:  Marijuana use  Anxious mood    History of Present illness: Jeremy Fritz is a 43 y.o. male.  With a past history of polysubstance abuse, cocaine dependency, psychosis.  Presented to [facility].  According to the patient he was sent here by [facility] for medical clearance.  When asked could he explain what he meant by medical clearance patient stated I do not know they just told me to come over here for medical clearance.  According to the patient he just finished an inpatient treatment program in [place]  on the 06/18/2024.   Per the patient he was at the [facility] from April 20, 2024 through June 18, 2024.  He stated that he has been living with his mother since then which was 2 days.  According to him he last used any marijuana 3 days ago he smoked a joint because he wanted to show up in his urine so he could get into a program.  Patient also stated he has not used any cocaine since June when he went into the program in [place].  Patient does not appear to be in any distress, his vital signs are within normal limits.  According to patient he is currently unemployed and is staying with his mother.  There is no clear indication that patient is under any influence of drugs or alcohol .  Patient was given the option for outpatient services due to the fact that patient just left a treatment facility which she was therefore 3 months and he stated that he has not used since June but only did a joint of marijuana 3 days ago so he could get into somewhere.  It does appear that patient is seeking secondary gain for somewhere to live.    Writer did consult with Dr. Cole MD.   Copied from triage notes: Jeremy Fritz is a 43 year old male who presented voluntarily to [facility] as a walk-in,  accompanied by his mother. He reports that he attempted to enter [facility] location) earlier today but was instructed to come here for medical clearance. Jeremy Fritz states that he was previously in residential treatment at the [facility] in [place], with treatment beginning on April 20, 2024, and ending on June 18, 2024. He reports multiple interruptions during his stay due to medical issues, insufficient access to medications, and a positive COVID-19 test on August 7, which required him to quarantine. He believes these disruptions prevented him from fully completing the program. Since his discharge from the [facility], June 18, 2024 (2 days ago), [name] has been living with his mother. He reports using THC yesterday, (a single joint), stating he felt the need to have something show up in his urine drug screen (UDS) to be accepted into our facility if he met our program criteria. He denies any other substance use since August 8. Prior to treatment, he reports daily and heavy cocaine use, which was the primary reason for seeking residential care. He denies alcohol  use. He also denies any current or past suicidal ideation (SI), homicidal ideations (HI), and auditory/visual hallucinations (AVH's). No therapist or psychiatrist.   Face-to-face observation of patient, patient is alert and oriented x 4, speech is clear, maintain eye contact.  Patient does not appear to be in any immediate distress, does not seem to be influenced  by internal stimuli.  Does not appear to be under the influence of any drugs.  Patient denies SI, HI, AVH or paranoia.  Patient reports last using marijuana 3 days ago (had 1 joint).  Patient denies access to guns. Patient was advised to call 911 or return to the nearest ED should he experience suicidal thoughts homicidal ideation or hallucination.   Recommend discharge for patient to follow up with outpatient services provided    Flowsheet Row ED  from 06/20/2024 in [facility] ED to Hosp-Admission (Discharged) from [facility] (Discharged) from [facility]/2022 [facility]  C-SSRS RISK CATEGORY No Risk No Risk No Risk    Psychiatric Specialty Exam  Presentation  General Appearance:Casual  Eye Contact:Good  Speech:Clear and Coherent  Speech Volume:Normal  Handedness:Right   Mood and Affect  Mood: Anxious  Affect: Congruent   Thought Process  Thought Processes: Coherent  Descriptions of Associations:Intact  Orientation:Full (Time, Place and Person)  Thought Content:Illogical    Hallucinations:None  Ideas of Reference:None  Suicidal Thoughts:No  Homicidal Thoughts:No   Sensorium  Memory: Immediate Fair  Judgment: Fair  Insight: Fair   Art therapist  Concentration: Fair  Attention Span: Fair  Recall: Good  Fund of Knowledge: Good  Language: Good   Psychomotor Activity  Psychomotor Activity: Normal   Assets  Assets: Desire for Improvement; Resilience   Sleep  Sleep: Fair  Number of hours:  7   Physical Exam: Physical Exam HENT:     Head: Normocephalic.     Nose: Nose normal.  Eyes:     Pupils: Pupils are equal, round, and reactive to light.  Cardiovascular:     Rate and Rhythm: Normal rate.  Pulmonary:     Effort: Pulmonary effort is normal.  Musculoskeletal:        General: Normal range of motion.     Cervical back: Normal range of motion.  Skin:    General: Skin is warm.  Neurological:     General: No focal deficit present.     Mental Status: He is alert.  Psychiatric:        Thought Content: Thought content normal.    Review of Systems  Constitutional: Negative.   HENT: Negative.    Eyes: Negative.   Respiratory: Negative.    Cardiovascular: Negative.   Gastrointestinal: Negative.   Genitourinary: Negative.    Musculoskeletal: Negative.   Skin: Negative.   Neurological: Negative.   Psychiatric/Behavioral:  The patient is nervous/anxious.    Blood pressure 112/75, pulse 99, temperature 98 F (36.7 C), temperature source Oral, resp. rate 16, height 5' 9 (1.753 m), weight 160 lb (72.6 kg), SpO2 98%. Body mass index is 23.63 kg/m.  Musculoskeletal: Strength & Muscle Tone: within normal limits Gait & Station: normal Patient leans: N/A   [facility] MSE Discharge Disposition for Follow up and Recommendations: Based on my evaluation the patient does not appear to have an emergency medical condition and can be discharged with resources and follow up care in outpatient services for Substance Abuse Intensive Outpatient Program   Gaither Pouch, NP 06/20/2024, 7:57 PM

## 2024-06-20 NOTE — Discharge Instructions (Signed)
F/u with outpatient resources  ?

## 2024-06-20 NOTE — Progress Notes (Signed)
   06/20/24 1801  [facility] Triage Screening (Walk-ins at [facility] only)  How Did You Hear About Us ?  ([facility])  What Is the Reason for Your Visit/Call Today? Jeremy Fritz is a 43 year old male who presented voluntarily to [facility] as a walk-in, accompanied by his mother. He reports that he attempted to enter [facility] location) earlier today but was instructed to come here for medical clearance. Jeremy Fritz states that he was previously in residential treatment at the [facility] in [place], with treatment beginning on April 20, 2024, and ending on June 18, 2024. He reports multiple interruptions during his stay due to medical issues, insufficient access to medications, and a positive COVID-19 test on August 7, which required him to quarantine. He believes these disruptions prevented him from fully completing the program. Since his discharge from the [facility], June 18, 2024 (2 days ago), Jeremy Fritz has been living with his mother. He reports using THC yesterday, (a single joint), stating he felt the need to have something show up in his urine drug screen (UDS) to be accepted into our facility if he met our program criteria. He denies any other substance use since August 8. Prior to treatment, he reports daily and heavy cocaine use, which was the primary reason for seeking residential care. He denies alcohol  use. He also denies any current or past suicidal ideation (SI), homicidal ideations (HI), and auditory/visual hallucinations (AVH's). No therapist or psychiatrist.  How Long Has This Been Causing You Problems? > than 6 months  Have You Recently Had Any Thoughts About Hurting Yourself? No  Are You Planning to Commit Suicide/Harm Yourself At This time? No  Have you Recently Had Thoughts About Hurting Someone Jeremy Fritz? No  Are You Planning To Harm Someone At This Time? No  Physical Abuse Denies  Verbal Abuse Denies  Sexual Abuse Denies  Exploitation of patient/patient's  resources Denies  Self-Neglect Denies  Are you currently experiencing any auditory, visual or other hallucinations? No  Have You Used Any Alcohol  or Drugs in the Past 24 Hours? Yes  What Did You Use and How Much? Yes, THC, he reports using THC yesterday (one joint)  Do you have any current medical co-morbidities that require immediate attention? No  Clinician description of patient physical appearance/behavior: Calm and cooperative.  What Do You Feel Would Help You the Most Today? Stress Management;Treatment for Depression or other mood problem;Alcohol  or Drug Use Treatment  If access to [facility] was not available, would you have sought care in the Emergency Department? No  Determination of Need Routine (7 days)  Options For Referral Medication Management;Facility-Based Crisis;Chemical Dependency Intensive Outpatient Therapy (CDIOP)

## 2024-06-23 ENCOUNTER — Other Ambulatory Visit (HOSPITAL_COMMUNITY): Payer: Self-pay

## 2024-06-28 ENCOUNTER — Other Ambulatory Visit (HOSPITAL_COMMUNITY): Payer: Self-pay | Admitting: Family Medicine

## 2024-06-28 ENCOUNTER — Other Ambulatory Visit (HOSPITAL_COMMUNITY): Payer: Self-pay

## 2024-06-30 ENCOUNTER — Other Ambulatory Visit (HOSPITAL_COMMUNITY): Payer: Self-pay

## 2024-07-01 ENCOUNTER — Other Ambulatory Visit (HOSPITAL_COMMUNITY): Payer: Self-pay

## 2024-07-06 ENCOUNTER — Other Ambulatory Visit (HOSPITAL_COMMUNITY): Payer: Self-pay

## 2024-07-07 ENCOUNTER — Other Ambulatory Visit (HOSPITAL_COMMUNITY): Payer: Self-pay

## 2024-07-13 ENCOUNTER — Other Ambulatory Visit (HOSPITAL_COMMUNITY): Payer: Self-pay

## 2024-07-14 ENCOUNTER — Other Ambulatory Visit (HOSPITAL_COMMUNITY): Payer: Self-pay

## 2024-07-15 ENCOUNTER — Other Ambulatory Visit (HOSPITAL_COMMUNITY): Payer: Self-pay

## 2024-07-18 ENCOUNTER — Other Ambulatory Visit (HOSPITAL_COMMUNITY): Payer: Self-pay

## 2024-07-19 ENCOUNTER — Other Ambulatory Visit (HOSPITAL_COMMUNITY): Payer: Self-pay

## 2024-07-20 ENCOUNTER — Other Ambulatory Visit (HOSPITAL_COMMUNITY): Payer: Self-pay

## 2024-07-20 ENCOUNTER — Encounter: Payer: Self-pay | Admitting: Family

## 2024-07-21 NOTE — Progress Notes (Signed)
   This encounter was created in error - please disregard. No show

## 2024-07-22 ENCOUNTER — Other Ambulatory Visit: Payer: Self-pay

## 2024-08-06 ENCOUNTER — Other Ambulatory Visit: Payer: Self-pay

## 2024-08-06 ENCOUNTER — Encounter (HOSPITAL_COMMUNITY): Payer: Self-pay | Admitting: Emergency Medicine

## 2024-08-06 ENCOUNTER — Emergency Department (HOSPITAL_COMMUNITY)

## 2024-08-06 ENCOUNTER — Inpatient Hospital Stay (HOSPITAL_COMMUNITY)
Admission: EM | Admit: 2024-08-06 | Discharge: 2024-08-09 | DRG: 872 | Attending: Internal Medicine | Admitting: Internal Medicine

## 2024-08-06 DIAGNOSIS — Z1152 Encounter for screening for COVID-19: Secondary | ICD-10-CM | POA: Diagnosis not present

## 2024-08-06 DIAGNOSIS — F1990 Other psychoactive substance use, unspecified, uncomplicated: Secondary | ICD-10-CM | POA: Diagnosis present

## 2024-08-06 DIAGNOSIS — Z83438 Family history of other disorder of lipoprotein metabolism and other lipidemia: Secondary | ICD-10-CM | POA: Diagnosis not present

## 2024-08-06 DIAGNOSIS — L0291 Cutaneous abscess, unspecified: Secondary | ICD-10-CM

## 2024-08-06 DIAGNOSIS — F1410 Cocaine abuse, uncomplicated: Secondary | ICD-10-CM | POA: Diagnosis present

## 2024-08-06 DIAGNOSIS — E872 Acidosis, unspecified: Secondary | ICD-10-CM | POA: Diagnosis present

## 2024-08-06 DIAGNOSIS — Z8249 Family history of ischemic heart disease and other diseases of the circulatory system: Secondary | ICD-10-CM | POA: Diagnosis not present

## 2024-08-06 DIAGNOSIS — I10 Essential (primary) hypertension: Secondary | ICD-10-CM | POA: Insufficient documentation

## 2024-08-06 DIAGNOSIS — Z8673 Personal history of transient ischemic attack (TIA), and cerebral infarction without residual deficits: Secondary | ICD-10-CM | POA: Diagnosis not present

## 2024-08-06 DIAGNOSIS — F17210 Nicotine dependence, cigarettes, uncomplicated: Secondary | ICD-10-CM | POA: Diagnosis present

## 2024-08-06 DIAGNOSIS — L02413 Cutaneous abscess of right upper limb: Secondary | ICD-10-CM | POA: Diagnosis present

## 2024-08-06 DIAGNOSIS — E785 Hyperlipidemia, unspecified: Secondary | ICD-10-CM | POA: Diagnosis present

## 2024-08-06 DIAGNOSIS — I5022 Chronic systolic (congestive) heart failure: Secondary | ICD-10-CM | POA: Diagnosis present

## 2024-08-06 DIAGNOSIS — Z7982 Long term (current) use of aspirin: Secondary | ICD-10-CM

## 2024-08-06 DIAGNOSIS — A419 Sepsis, unspecified organism: Principal | ICD-10-CM | POA: Diagnosis present

## 2024-08-06 DIAGNOSIS — Z91148 Patient's other noncompliance with medication regimen for other reason: Secondary | ICD-10-CM

## 2024-08-06 DIAGNOSIS — L853 Xerosis cutis: Secondary | ICD-10-CM | POA: Diagnosis present

## 2024-08-06 DIAGNOSIS — Z79899 Other long term (current) drug therapy: Secondary | ICD-10-CM | POA: Diagnosis not present

## 2024-08-06 DIAGNOSIS — M545 Low back pain, unspecified: Secondary | ICD-10-CM | POA: Diagnosis present

## 2024-08-06 DIAGNOSIS — I110 Hypertensive heart disease with heart failure: Secondary | ICD-10-CM | POA: Diagnosis present

## 2024-08-06 DIAGNOSIS — L0390 Cellulitis, unspecified: Secondary | ICD-10-CM | POA: Diagnosis not present

## 2024-08-06 LAB — CBC WITH DIFFERENTIAL/PLATELET
Abs Immature Granulocytes: 0.05 K/uL (ref 0.00–0.07)
Basophils Absolute: 0.1 K/uL (ref 0.0–0.1)
Basophils Relative: 1 %
Eosinophils Absolute: 0.1 K/uL (ref 0.0–0.5)
Eosinophils Relative: 0 %
HCT: 47.6 % (ref 39.0–52.0)
Hemoglobin: 15.7 g/dL (ref 13.0–17.0)
Immature Granulocytes: 0 %
Lymphocytes Relative: 17 %
Lymphs Abs: 2.3 K/uL (ref 0.7–4.0)
MCH: 28 pg (ref 26.0–34.0)
MCHC: 33 g/dL (ref 30.0–36.0)
MCV: 84.8 fL (ref 80.0–100.0)
Monocytes Absolute: 1.2 K/uL — ABNORMAL HIGH (ref 0.1–1.0)
Monocytes Relative: 8 %
Neutro Abs: 10.4 K/uL — ABNORMAL HIGH (ref 1.7–7.7)
Neutrophils Relative %: 74 %
Platelets: 324 K/uL (ref 150–400)
RBC: 5.61 MIL/uL (ref 4.22–5.81)
RDW: 17.9 % — ABNORMAL HIGH (ref 11.5–15.5)
WBC: 14.1 K/uL — ABNORMAL HIGH (ref 4.0–10.5)
nRBC: 0 % (ref 0.0–0.2)

## 2024-08-06 LAB — RESP PANEL BY RT-PCR (RSV, FLU A&B, COVID)  RVPGX2
Influenza A by PCR: NEGATIVE
Influenza B by PCR: NEGATIVE
Resp Syncytial Virus by PCR: NEGATIVE
SARS Coronavirus 2 by RT PCR: NEGATIVE

## 2024-08-06 LAB — COMPREHENSIVE METABOLIC PANEL WITH GFR
ALT: 46 U/L — ABNORMAL HIGH (ref 0–44)
AST: 56 U/L — ABNORMAL HIGH (ref 15–41)
Albumin: 3.5 g/dL (ref 3.5–5.0)
Alkaline Phosphatase: 72 U/L (ref 38–126)
Anion gap: 10 (ref 5–15)
BUN: 13 mg/dL (ref 6–20)
CO2: 26 mmol/L (ref 22–32)
Calcium: 9.2 mg/dL (ref 8.9–10.3)
Chloride: 104 mmol/L (ref 98–111)
Creatinine, Ser: 1.15 mg/dL (ref 0.61–1.24)
GFR, Estimated: >60 mL/min (ref >60)
Glucose, Bld: 100 mg/dL — ABNORMAL HIGH (ref 70–99)
Potassium: 4 mmol/L (ref 3.5–5.1)
Sodium: 141 mmol/L (ref 135–145)
Total Bilirubin: 1.1 mg/dL (ref 0.0–1.2)
Total Protein: 6.8 g/dL (ref 6.5–8.1)

## 2024-08-06 LAB — URINALYSIS, W/ REFLEX TO CULTURE (INFECTION SUSPECTED)
Bacteria, UA: NONE SEEN
Bilirubin Urine: NEGATIVE
Glucose, UA: NEGATIVE mg/dL
Hgb urine dipstick: NEGATIVE
Ketones, ur: NEGATIVE mg/dL
Leukocytes,Ua: NEGATIVE
Nitrite: NEGATIVE
Protein, ur: NEGATIVE mg/dL
Specific Gravity, Urine: 1.02 (ref 1.005–1.030)
pH: 6 (ref 5.0–8.0)

## 2024-08-06 LAB — I-STAT CG4 LACTIC ACID, ED
Lactic Acid, Venous: 2.2 mmol/L (ref 0.5–1.9)
Lactic Acid, Venous: 1.1 mmol/L (ref 0.5–1.9)

## 2024-08-06 LAB — PRO BRAIN NATRIURETIC PEPTIDE: Pro Brain Natriuretic Peptide: 11609 pg/mL — ABNORMAL HIGH (ref <300.0)

## 2024-08-06 LAB — MAGNESIUM: Magnesium: 2.1 mg/dL (ref 1.7–2.4)

## 2024-08-06 LAB — DIGOXIN LEVEL: Digoxin Level: <0.6 ng/mL — ABNORMAL LOW (ref 0.8–2.0)

## 2024-08-06 LAB — PROTIME-INR
INR: 1.1 (ref 0.8–1.2)
Prothrombin Time: 15.1 s (ref 11.4–15.2)

## 2024-08-06 LAB — SEDIMENTATION RATE: Sed Rate: 22 mm/h — ABNORMAL HIGH (ref 0–16)

## 2024-08-06 MED ORDER — ONDANSETRON HCL 4 MG PO TABS: 4.0000 mg | ORAL_TABLET | Freq: Four times a day (QID) | ORAL | Status: DC | PRN

## 2024-08-06 MED ORDER — VANCOMYCIN HCL IN DEXTROSE 1-5 GM/200ML-% IV SOLN: 1000.0000 mg | Freq: Once | INTRAVENOUS | Status: DC

## 2024-08-06 MED ORDER — LACTATED RINGERS IV BOLUS (SEPSIS)
1000.0000 mL | Freq: Once | INTRAVENOUS | Status: AC
  Administered 2024-08-06: 1000 mL via INTRAVENOUS

## 2024-08-06 MED ORDER — ACETAMINOPHEN 650 MG RE SUPP
650.0000 mg | Freq: Four times a day (QID) | RECTAL | Status: DC | PRN
Start: 2024-08-06 — End: 2024-08-09

## 2024-08-06 MED ORDER — IOHEXOL 300 MG/ML  SOLN
100.0000 mL | Freq: Once | INTRAMUSCULAR | Status: AC | PRN
  Administered 2024-08-06: 100 mL via INTRAVENOUS

## 2024-08-06 MED ORDER — LIDOCAINE HCL 2 % IJ SOLN
10.0000 mL | Freq: Once | INTRAMUSCULAR | Status: DC
  Filled 2024-08-06: qty 20

## 2024-08-06 MED ORDER — KETOROLAC TROMETHAMINE 15 MG/ML IJ SOLN
15.0000 mg | Freq: Four times a day (QID) | INTRAMUSCULAR | Status: DC | PRN
  Administered 2024-08-07: 15 mg via INTRAVENOUS
  Filled 2024-08-06: qty 1

## 2024-08-06 MED ORDER — NICOTINE 21 MG/24HR TD PT24
21.0000 mg | MEDICATED_PATCH | Freq: Every day | TRANSDERMAL | Status: DC
Start: 2024-08-06 — End: 2024-08-09
  Filled 2024-08-06 (×3): qty 1

## 2024-08-06 MED ORDER — VANCOMYCIN HCL 1500 MG/300ML IV SOLN
1500.0000 mg | Freq: Once | INTRAVENOUS | Status: AC
  Administered 2024-08-06: 1500 mg via INTRAVENOUS
  Filled 2024-08-06: qty 300

## 2024-08-06 MED ORDER — SODIUM CHLORIDE 0.9 % IV SOLN
2.0000 g | INTRAVENOUS | Status: DC
  Administered 2024-08-07 – 2024-08-08 (×2): 2 g via INTRAVENOUS
  Filled 2024-08-06 (×2): qty 20

## 2024-08-06 MED ORDER — SODIUM CHLORIDE 0.9% FLUSH
3.0000 mL | Freq: Two times a day (BID) | INTRAVENOUS | Status: DC
  Administered 2024-08-07 – 2024-08-08 (×5): 3 mL via INTRAVENOUS

## 2024-08-06 MED ORDER — ENOXAPARIN SODIUM 40 MG/0.4ML IJ SOSY
40.0000 mg | PREFILLED_SYRINGE | INTRAMUSCULAR | Status: DC
  Administered 2024-08-07 – 2024-08-08 (×2): 40 mg via SUBCUTANEOUS
  Filled 2024-08-06 (×2): qty 0.4

## 2024-08-06 MED ORDER — SENNOSIDES-DOCUSATE SODIUM 8.6-50 MG PO TABS: 1.0000 | ORAL_TABLET | Freq: Every evening | ORAL | Status: DC | PRN

## 2024-08-06 MED ORDER — OXYCODONE-ACETAMINOPHEN 5-325 MG PO TABS
1.0000 | ORAL_TABLET | Freq: Once | ORAL | Status: AC
  Administered 2024-08-06: 1 via ORAL
  Filled 2024-08-06: qty 1

## 2024-08-06 MED ORDER — ACETAMINOPHEN 325 MG PO TABS
650.0000 mg | ORAL_TABLET | Freq: Four times a day (QID) | ORAL | Status: DC | PRN
  Administered 2024-08-07: 650 mg via ORAL
  Filled 2024-08-06: qty 2

## 2024-08-06 MED ORDER — ONDANSETRON HCL 4 MG/2ML IJ SOLN: 4.0000 mg | Freq: Four times a day (QID) | INTRAMUSCULAR | Status: DC | PRN

## 2024-08-06 MED ORDER — METRONIDAZOLE 500 MG/100ML IV SOLN
500.0000 mg | Freq: Once | INTRAVENOUS | Status: AC
  Administered 2024-08-06: 500 mg via INTRAVENOUS
  Filled 2024-08-06: qty 100

## 2024-08-06 MED ORDER — ACETAMINOPHEN 325 MG PO TABS
650.0000 mg | ORAL_TABLET | Freq: Once | ORAL | Status: AC
Start: 2024-08-06 — End: 2024-08-06
  Administered 2024-08-06: 650 mg via ORAL
  Filled 2024-08-06: qty 2

## 2024-08-06 MED ORDER — SODIUM CHLORIDE 0.9 % IV SOLN
2.0000 g | Freq: Once | INTRAVENOUS | Status: AC
  Administered 2024-08-06: 2 g via INTRAVENOUS
  Filled 2024-08-06: qty 12.5
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
## 2024-08-06 NOTE — Progress Notes (Incomplete)
 Pharmacy Antibiotic Note  Jeremy Fritz is a 43 y.o. male admitted on 08/06/2024 with cellulitis.  Pharmacy has been consulted for Vancomycin  dosing.  Plan: Vancomycin      Temp (24hrs), Avg:99.9 F (37.7 C), Min:98.6 F (37 C), Max:102.3 F (39.1 C)  Recent Labs  Lab 08/06/24 1845 08/06/24 1859 08/06/24 2215  WBC 14.1*  --   --   CREATININE 1.15  --   --   LATICACIDVEN  --  2.2* 1.1    CrCl cannot be calculated (Unknown ideal weight.).    No Known Allergies  Antimicrobials this admission: 10/11 Cefepime/Flagyl x1 10/11 Vancomycin  >>  10/12 Rocephin  >>   Dose adjustments this admission:  Microbiology results: 10/11 BCx:  10/11 Resp PCR: negative  Thank you for allowing pharmacy to be a part of this patient's care.  Rosaline Millet PharmD 08/06/2024 11:54 PM

## 2024-08-06 NOTE — Progress Notes (Signed)
 Pharmacy Antibiotic Note  Jeremy Fritz is a 43 y.o. male admitted on 08/06/2024 with R forearm cellulitis.  Pharmacy has been consulted for Vancomycin  dosing. -s/p I&D -wt 72.6kg on 06/20/24; est CrCl >13ml/min  Plan: Vancomycin  1gm IV q12h for est AUC 523 (goal 400-550) Monitor renal function and cx data      Temp (24hrs), Avg:99.9 F (37.7 C), Min:98.6 F (37 C), Max:102.3 F (39.1 C)  Recent Labs  Lab 08/06/24 1845 08/06/24 1859 08/06/24 2215  WBC 14.1*  --   --   CREATININE 1.15  --   --   LATICACIDVEN  --  2.2* 1.1    CrCl cannot be calculated (Unknown ideal weight.).    No Known Allergies  Antimicrobials this admission: 10/11 Cefepime/Flagyl x1 10/11 Vancomycin  >>  10/12 Rocephin  >>   Dose adjustments this admission:  Microbiology results: 10/11 BCx:  10/11 Resp PCR: negative  Thank you for allowing pharmacy to be a part of this patient's care.  Rosaline Millet PharmD 08/06/2024 11:54 PM

## 2024-08-06 NOTE — ED Notes (Signed)
Gave pt snack and drink 

## 2024-08-06 NOTE — ED Provider Notes (Signed)
 [place] EMERGENCY DEPARTMENT AT [name] Provider Note   CSN: [id number] Arrival date & time: 08/06/24  1753     Patient presents with: Abscess   Jeremy Fritz is a 43 y.o. male.    Abscess Associated symptoms: fatigue and fever   Patient presents for multiple complaints.  Medical history includes polysubstance abuse, HTN, CVA, CHF, HLD.  At baseline, he takes Lasix  and digoxin .  He went to jail 2 days ago.  He states that he was using cocaine prior to that.  He denies any other illicit drug use or alcohol  use.  For the past 3 days, he has had a painful swelling on his right forearm, back pain, fevers, chills.  He also has some painful cracking, skin under his toes.    Prior to Admission medications   Medication Sig Start Date End Date Taking? Authorizing Provider  acetaminophen  (TYLENOL ) 325 MG tablet Take 1-2 tablets (325-650 mg total) by mouth every 4 (four) hours as needed for mild pain. 01/08/23   Love, Sharlet RAMAN, PA-C  albuterol  (VENTOLIN  HFA) 108 (90 Base) MCG/ACT inhaler Inhale 2 puffs into the lungs every 6 (six) hours as needed for wheezing or shortness of breath. Patient not taking: Reported on 05/17/2024    [provider]  aspirin  EC 81 MG tablet Take 1 tablet (81 mg total) by mouth daily. Swallow whole. 05/11/24   Briana Elgin LABOR, MD  atorvastatin  (LIPITOR) 40 MG tablet Take 1 tablet (40 mg total) by mouth daily. 05/11/24   Briana Elgin LABOR, MD  carvedilol  (COREG ) 3.125 MG tablet Take 1 tablet (3.125 mg total) by mouth 2 (two) times daily with a meal. 05/12/24   Briana Elgin LABOR, MD  dapagliflozin  propanediol (FARXIGA ) 10 MG TABS tablet Take 1 tablet (10 mg total) by mouth daily. 05/12/24   Briana Elgin LABOR, MD  digoxin  (LANOXIN ) 0.125 MG tablet Take 1 tablet (0.125 mg total) by mouth daily. 05/17/24   Clegg, Amy D, NP  furosemide  (LASIX ) 20 MG tablet Take 1 tablet (20 mg total) by mouth daily. 05/17/24   Clegg, Amy D, NP  nicotine  polacrilex (NICORETTE )  2 MG gum Take 1 each (2 mg total) by mouth as needed for smoking cessation. Patient not taking: Reported on 05/17/2024 01/09/23   Love, Sharlet RAMAN, PA-C  sacubitril -valsartan  (ENTRESTO ) 49-51 MG Take 1 tablet by mouth 2 (two) times daily. 05/11/24   Briana Elgin LABOR, MD  spironolactone  (ALDACTONE ) 25 MG tablet Take 1 tablet (25 mg total) by mouth daily. 05/12/24   Briana Elgin LABOR, MD  traZODone  (DESYREL ) 50 MG tablet Take 0.5 tablets (25 mg total) by mouth at bedtime as needed for sleep. Patient not taking: Reported on 05/17/2024 01/08/23   Maurice Sharlet RAMAN, PA-C    Allergies: Patient has no known allergies.    Review of Systems  Constitutional:  Positive for chills, fatigue and fever.  Musculoskeletal:  Positive for back pain.  Skin:  Positive for wound.  Neurological:  Positive for weakness (Generalized).  All other systems reviewed and are negative.   Updated Vital Signs BP (!) 138/98   Pulse 95   Temp 98.6 F (37 C)   Resp 13   SpO2 93%   Physical Exam Vitals and nursing note reviewed.  Constitutional:      General: He is not in acute distress.    Appearance: Normal appearance. He is well-developed. He is ill-appearing. He is not toxic-appearing or diaphoretic.  HENT:     Head:  Normocephalic and atraumatic.     Right Ear: External ear normal.     Left Ear: External ear normal.     Nose: Nose normal.     Mouth/Throat:     Mouth: Mucous membranes are moist.  Eyes:     Extraocular Movements: Extraocular movements intact.     Conjunctiva/sclera: Conjunctivae normal.  Cardiovascular:     Rate and Rhythm: Normal rate and regular rhythm.  Pulmonary:     Effort: Pulmonary effort is normal. No respiratory distress.     Breath sounds: Normal breath sounds. No wheezing or rales.  Chest:     Chest wall: No tenderness.  Abdominal:     General: There is no distension.     Palpations: Abdomen is soft.     Tenderness: There is no abdominal tenderness.  Musculoskeletal:        General:  Swelling and tenderness present.     Cervical back: Normal range of motion and neck supple.  Skin:    General: Skin is warm and dry.     Findings: Erythema present.  Neurological:     General: No focal deficit present.     Mental Status: He is alert and oriented to person, place, and time.  Psychiatric:        Mood and Affect: Mood normal.        Behavior: Behavior normal.     (all labs ordered are listed, but only abnormal results are displayed) Labs Reviewed  COMPREHENSIVE METABOLIC PANEL WITH GFR - Abnormal; Notable for the following components:      Result Value   Glucose, Bld 100 (*)    AST 56 (*)    ALT 46 (*)    All other components within normal limits  CBC WITH DIFFERENTIAL/PLATELET - Abnormal; Notable for the following components:   WBC 14.1 (*)    RDW 17.9 (*)    Neutro Abs 10.4 (*)    Monocytes Absolute 1.2 (*)    All other components within normal limits  URINALYSIS, W/ REFLEX TO CULTURE (INFECTION SUSPECTED) - Abnormal; Notable for the following components:   Color, Urine AMBER (*)    All other components within normal limits  DIGOXIN  LEVEL - Abnormal; Notable for the following components:   Digoxin  Level <0.6 (*)    All other components within normal limits  I-STAT CG4 LACTIC ACID, ED - Abnormal; Notable for the following components:   Lactic Acid, Venous 2.2 (*)    All other components within normal limits  RESP PANEL BY RT-PCR (RSV, FLU A&B, COVID)  RVPGX2  CULTURE, BLOOD (ROUTINE X 2)  CULTURE, BLOOD (ROUTINE X 2)  PROTIME-INR  MAGNESIUM   SEDIMENTATION RATE  C-REACTIVE PROTEIN  PRO BRAIN NATRIURETIC PEPTIDE  I-STAT CG4 LACTIC ACID, ED    EKG: EKG Interpretation Date/Time:  Saturday August 06 2024 19:58:52 EDT Ventricular Rate:  107 PR Interval:  153 QRS Duration:  103 QT Interval:  370 QTC Calculation: 494 R Axis:   -77  Text Interpretation: Sinus tachycardia Biatrial enlargement Left anterior fascicular block Anteroseptal infarct, age  indeterminate Borderline T abnormalities, inferior leads Confirmed by Melvenia Motto [phone]) on 08/06/2024 8:45:42 PM  Radiology: CT Head Wo Contrast Result Date: 08/06/2024 CLINICAL DATA:  Altered mental status EXAM: CT HEAD WITHOUT CONTRAST TECHNIQUE: Contiguous axial images were obtained from the base of the skull through the vertex without intravenous contrast. RADIATION DOSE REDUCTION: This exam was performed according to the departmental dose-optimization program which includes automated exposure control, adjustment of  the mA and/or kV according to patient size and/or use of iterative reconstruction technique. COMPARISON:  01/27/2023 FINDINGS: Brain: No evidence of acute infarction, hemorrhage, hydrocephalus, extra-axial collection or mass lesion/mass effect. Chronic right MCA infarct is again noted. Vascular: No hyperdense vessel or unexpected calcification. Skull: Normal. Negative for fracture or focal lesion. Sinuses/Orbits: No acute finding. Other: None. IMPRESSION: Chronic right MCA infarct.  No acute abnormality noted. Electronically Signed   By: Oneil Devonshire M.D.   On: 08/06/2024 22:14   CT T-SPINE NO CHARGE Result Date: 08/06/2024 CLINICAL DATA:  Fevers and back pain, initial encounter EXAM: CT THORACIC SPINE WITHOUT CONTRAST TECHNIQUE: Multidetector CT images of the thoracic were obtained using the standard protocol without intravenous contrast. RADIATION DOSE REDUCTION: This exam was performed according to the departmental dose-optimization program which includes automated exposure control, adjustment of the mA and/or kV according to patient size and/or use of iterative reconstruction technique. COMPARISON:  None Available. FINDINGS: Alignment: Within normal limits. Vertebrae: 12 thoracic vertebra are noted. Vertebral body height is well maintained. No compression deformity is seen. Paraspinal and other soft tissues: Paraspinal soft tissues are within normal limits. Ingested material is noted  within the esophagus. 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 Disc levels: No specific disc pathology is noted. IMPRESSION: No acute abnormality noted. Electronically Signed   By: Oneil Devonshire M.D.   On: 08/06/2024 22:13   CT L-SPINE NO CHARGE Result Date: 08/06/2024 CLINICAL DATA:  Fever and chills EXAM: CT LUMBAR SPINE WITHOUT CONTRAST TECHNIQUE: Multidetector CT imaging of the lumbar spine was performed without intravenous contrast administration. Multiplanar CT image reconstructions were also generated. RADIATION DOSE REDUCTION: This exam was performed according to the departmental dose-optimization program which includes automated exposure control, adjustment of the mA and/or kV according to patient size and/or use of iterative reconstruction technique. COMPARISON:  None Available. FINDINGS: Segmentation: 5 lumbar type vertebral bodies are well visualized. Alignment: Vertebral body height and alignment are well maintained. Vertebrae: No anterolisthesis is seen.  No pars defects are seen. Paraspinal and other soft tissues: Paraspinal soft tissues are within normal limits. Disc levels: No acute disc pathology is seen. IMPRESSION: No acute abnormality noted. Electronically Signed   By: Oneil Devonshire M.D.   On: 08/06/2024 22:10   CT CHEST ABDOMEN PELVIS W CONTRAST Result Date: 08/06/2024 CLINICAL DATA:  Possible sepsis EXAM: CT CHEST, ABDOMEN, AND PELVIS WITH CONTRAST TECHNIQUE: Multidetector CT imaging of the chest, abdomen and pelvis was performed following the standard protocol during bolus administration of intravenous contrast. RADIATION DOSE REDUCTION: This exam was performed according to the departmental dose-optimization program which includes automated exposure control, adjustment of the mA and/or kV according to patient size and/or use of iterative reconstruction technique. CONTRAST:  OMNIPAQUE  IOHEXOL  300 MG/ML  SOLN COMPARISON:  Chest x-ray from earlier in the same day. 05/07/2024  CT FINDINGS: CT CHEST FINDINGS Cardiovascular: Aberrant right subclavian artery is noted. Thoracic aorta is otherwise within normal limits. Heart is enlarged in size. No pericardial effusion is noted. Mild coronary calcifications are noted. No large central pulmonary embolism is seen although not timed for embolus evaluation. Mediastinum/Nodes: Thoracic inlet is within normal limits. The esophagus as visualized is unremarkable. No hilar or mediastinal adenopathy is noted. Lungs/Pleura: Lungs are well aerated bilaterally. Significant improved aeration is seen when compared with the prior exam. No sizable effusion is seen. No parenchymal nodules are seen. Musculoskeletal: No chest wall mass or suspicious bone lesions identified. CT ABDOMEN PELVIS FINDINGS Hepatobiliary: No focal liver  abnormality is seen. No gallstones, gallbladder wall thickening, or biliary dilatation. Pancreas: Unremarkable. No pancreatic ductal dilatation or surrounding inflammatory changes. Spleen: Normal in size without focal abnormality. Adrenals/Urinary Tract: Adrenal glands are within normal limits. Kidneys demonstrate a normal enhancement pattern. Renal cysts are seen stable from prior exam. No follow-up is recommended. No renal calculi or obstructive changes are seen. Bladder is partially distended. Stomach/Bowel: Fecal material is noted throughout the colon. No obstructive changes are seen. The appendix is not well visualized. No inflammatory changes are seen to suggest appendicitis. Small bowel and stomach are unremarkable. Vascular/Lymphatic: No significant vascular findings are present. No enlarged abdominal or pelvic lymph nodes. Reproductive: Prostate is unremarkable. Other: No abdominal wall hernia or abnormality. No abdominopelvic ascites. Musculoskeletal: No acute or significant osseous findings. IMPRESSION: CT of the chest: Aberrant right subclavian artery is noted. No acute abnormality is seen. CT of the abdomen and pelvis: No  acute abnormality noted. Electronically Signed   By: Oneil Devonshire M.D.   On: 08/06/2024 22:08   DG Forearm Right Result Date: 08/06/2024 CLINICAL DATA:  Forearm infection EXAM: RIGHT FOREARM - 2 VIEW COMPARISON:  None Available. FINDINGS: Soft tissue swelling noted in the mid forearm. No acute bony abnormality. Specifically, no fracture, subluxation, or dislocation. No radiopaque foreign body. IMPRESSION: No fracture or foreign body. Electronically Signed   By: Franky Crease M.D.   On: 08/06/2024 20:34   DG Chest Port 1 View Result Date: 08/06/2024 CLINICAL DATA:  Questionable sepsis. EXAM: PORTABLE CHEST 1 VIEW COMPARISON:  05/07/2024 FINDINGS: Cardiomegaly. No confluent airspace opacities, effusions or edema. No acute bony abnormality. IMPRESSION: Cardiomegaly.  No active disease. Electronically Signed   By: Franky Crease M.D.   On: 08/06/2024 19:54     .Incision and Drainage  Date/Time: 08/06/2024 10:41 PM  Performed by: Melvenia Motto, MD Authorized by: Melvenia Motto, MD   Consent:    Consent obtained:  Verbal   Consent given by:  Patient   Risks, benefits, and alternatives were discussed: yes     Risks discussed:  Incomplete drainage and pain   Alternatives discussed:  No treatment and delayed treatment Universal protocol:    Procedure explained and questions answered to patient or proxy's satisfaction: yes     Patient identity confirmed:  Verbally with patient Location:    Type:  Abscess   Size:  1cm   Location:  Upper extremity   Upper extremity location:  Wrist   Wrist location:  R wrist Pre-procedure details:    Skin preparation:  Povidone-iodine Sedation:    Sedation type:  None Anesthesia:    Anesthesia method:  Local infiltration   Local anesthetic:  Lidocaine  2% w/o epi Procedure type:    Complexity:  Simple Procedure details:    Incision types:  Single straight   Incision depth:  Dermal   Drainage:  Purulent   Drainage amount:  Moderate   Wound treatment:  Wound  left open Post-procedure details:    Procedure completion:  Tolerated well, no immediate complications    Medications Ordered in the ED  lidocaine  (XYLOCAINE ) 2 % (with pres) injection 200 mg (0 mg Intradermal Hold 08/06/24 2141)  vancomycin  (VANCOREADY) IVPB 1500 mg/300 mL (has no administration in time range)  lactated ringers  bolus 1,000 mL (0 mLs Intravenous Stopped 08/06/24 2141)  ceFEPIme (MAXIPIME) 2 g in sodium chloride  0.9 % 100 mL IVPB (0 g Intravenous Stopped 08/06/24 2141)  metroNIDAZOLE (FLAGYL) IVPB 500 mg (500 mg Intravenous New Bag/Given 08/06/24 2109)  oxyCODONE-acetaminophen  (PERCOCET/ROXICET) 5-325 MG per tablet 1 tablet (1 tablet Oral Given 08/06/24 1957)  acetaminophen  (TYLENOL ) tablet 650 mg (650 mg Oral Given 08/06/24 1956)  iohexol  (OMNIPAQUE ) 300 MG/ML solution 100 mL (100 mLs Intravenous Contrast Given 08/06/24 2152)                                    Medical Decision Making Amount and/or Complexity of Data Reviewed Labs: ordered. Radiology: ordered.  Risk OTC drugs. Prescription drug management.   This patient presents to the ED for concern of multiple complaints, this involves an extensive number of treatment options, and is a complaint that carries with it a high risk of complications and morbidity.  The differential diagnosis includes infection, withdrawal, metabolic derangements   Co morbidities / Chronic conditions that complicate the patient evaluation  polysubstance abuse, HTN, CVA, CHF, HLD   Additional history obtained:  Additional history obtained from EMR External records from outside source obtained and reviewed including N/A   Lab Tests:  I Ordered, and personally interpreted labs.  The pertinent results include:  Leukocytosis and lactic acidosis present consistent with sepsis; lab work otherwise unremarkable   Imaging Studies ordered:  I ordered imaging studies including x-ray of chest and right forearm; CT of head, chest,  abdomen, pelvis, T-spine, L-spine; MRI of T and lumbar spine I independently visualized and interpreted imaging which showed no acute findings on CT or x-ray imaging.  MRI is pending at time of admission. I agree with the radiologist interpretation   Cardiac Monitoring: / EKG:  The patient was maintained on a cardiac monitor.  I personally viewed and interpreted the cardiac monitored which showed an underlying rhythm of: Sinus rhythm   Problem List / ED Course / Critical interventions / Medication management  Patient presenting for multiple complaints.  Meet sepsis criteria on arrival.  On exam, patient is alert and oriented.  He has a swollen, erythematous, and fluctuant area on posterior aspect of right forearm.  He has midline tenderness in the area of L1.  He has cutaneous cracking on his toes but this area does not look infected.  Broad-spectrum antibiotics and IV fluids were started.  Tylenol  given for antipyresis.  Lab work notable for leukocytosis.  Initial lactate was elevated but normalized after IV fluids.  CT imaging did not show any acute findings.  Abscess was incised and drained at bedside.  This mostly consisted of unroofing wound.  Pus was expressed.  Although this could be the source of his infection, there is ongoing concern of his back pain.  Patient denies any IV drug use.  MRI studies were ordered.  Patient was admitted for further management. I ordered medication including IV fluids and broad-spectrum antibiotics for empiric treatment of sepsis; Percocet for analgesia, Tylenol  for antipyresis Reevaluation of the patient after these medicines showed that the patient improved I have reviewed the patients home medicines and have made adjustments as needed  Social Determinants of Health:  Currently incarcerated      Final diagnoses:  Sepsis without acute organ dysfunction, due to unspecified organism [facility])  Abscess    ED Discharge Orders     None           Melvenia Motto, MD 08/06/24 2248

## 2024-08-06 NOTE — ED Triage Notes (Signed)
 Pt coming from jail with reports of abscess on the right forearm, chills, and fever. Pt reports he noticed the abscess 3 days ago.

## 2024-08-07 ENCOUNTER — Inpatient Hospital Stay (HOSPITAL_COMMUNITY)

## 2024-08-07 DIAGNOSIS — Z8673 Personal history of transient ischemic attack (TIA), and cerebral infarction without residual deficits: Secondary | ICD-10-CM

## 2024-08-07 DIAGNOSIS — A419 Sepsis, unspecified organism: Secondary | ICD-10-CM | POA: Diagnosis not present

## 2024-08-07 DIAGNOSIS — L0390 Cellulitis, unspecified: Secondary | ICD-10-CM

## 2024-08-07 DIAGNOSIS — E785 Hyperlipidemia, unspecified: Secondary | ICD-10-CM

## 2024-08-07 DIAGNOSIS — L02413 Cutaneous abscess of right upper limb: Secondary | ICD-10-CM

## 2024-08-07 LAB — COMPREHENSIVE METABOLIC PANEL WITH GFR
ALT: 34 U/L (ref 0–44)
AST: 34 U/L (ref 15–41)
Albumin: 2.8 g/dL — ABNORMAL LOW (ref 3.5–5.0)
Alkaline Phosphatase: 58 U/L (ref 38–126)
Anion gap: 10 (ref 5–15)
BUN: 15 mg/dL (ref 6–20)
CO2: 24 mmol/L (ref 22–32)
Calcium: 8.1 mg/dL — ABNORMAL LOW (ref 8.9–10.3)
Chloride: 108 mmol/L (ref 98–111)
Creatinine, Ser: 0.94 mg/dL (ref 0.61–1.24)
GFR, Estimated: >60 mL/min (ref >60)
Glucose, Bld: 111 mg/dL — ABNORMAL HIGH (ref 70–99)
Potassium: 3.8 mmol/L (ref 3.5–5.1)
Sodium: 141 mmol/L (ref 135–145)
Total Bilirubin: 0.6 mg/dL (ref 0.0–1.2)
Total Protein: 5.4 g/dL — ABNORMAL LOW (ref 6.5–8.1)

## 2024-08-07 LAB — CBC
HCT: 41.9 % (ref 39.0–52.0)
Hemoglobin: 13.6 g/dL (ref 13.0–17.0)
MCH: 27.8 pg (ref 26.0–34.0)
MCHC: 32.5 g/dL (ref 30.0–36.0)
MCV: 85.7 fL (ref 80.0–100.0)
Platelets: 278 K/uL (ref 150–400)
RBC: 4.89 MIL/uL (ref 4.22–5.81)
RDW: 17.4 % — ABNORMAL HIGH (ref 11.5–15.5)
WBC: 10.8 K/uL — ABNORMAL HIGH (ref 4.0–10.5)
nRBC: 0 % (ref 0.0–0.2)

## 2024-08-07 LAB — URINE DRUG SCREEN
Amphetamines: NEGATIVE
Barbiturates: NEGATIVE
Benzodiazepines: POSITIVE — AB
Cocaine: POSITIVE — AB
Fentanyl: NEGATIVE
Methadone Scn, Ur: NEGATIVE
Opiates: NEGATIVE
Tetrahydrocannabinol: POSITIVE — AB

## 2024-08-07 LAB — MRSA NEXT GEN BY PCR, NASAL: MRSA by PCR Next Gen: DETECTED — AB

## 2024-08-07 LAB — C-REACTIVE PROTEIN: CRP: 5.9 mg/dL — ABNORMAL HIGH (ref <1.0)

## 2024-08-07 MED ORDER — CARVEDILOL 3.125 MG PO TABS: 3.1250 mg | ORAL_TABLET | Freq: Two times a day (BID) | ORAL | Status: DC

## 2024-08-07 MED ORDER — CHLORHEXIDINE GLUCONATE CLOTH 2 % EX PADS
6.0000 | MEDICATED_PAD | Freq: Every day | CUTANEOUS | Status: DC
  Administered 2024-08-07 – 2024-08-09 (×3): 6 via TOPICAL

## 2024-08-07 MED ORDER — MUPIROCIN 2 % EX OINT
1.0000 | TOPICAL_OINTMENT | Freq: Two times a day (BID) | CUTANEOUS | Status: DC
  Administered 2024-08-07 – 2024-08-09 (×4): 1 via NASAL
  Filled 2024-08-07: qty 22

## 2024-08-07 MED ORDER — SODIUM CHLORIDE 0.9 % IV BOLUS
500.0000 mL | Freq: Once | INTRAVENOUS | Status: AC
  Administered 2024-08-07: 500 mL via INTRAVENOUS

## 2024-08-07 MED ORDER — DIGOXIN 125 MCG PO TABS
0.1250 mg | ORAL_TABLET | Freq: Every day | ORAL | Status: DC
  Administered 2024-08-07 – 2024-08-09 (×3): 0.125 mg via ORAL
  Filled 2024-08-07 (×3): qty 1

## 2024-08-07 MED ORDER — ASPIRIN 81 MG PO TBEC
81.0000 mg | DELAYED_RELEASE_TABLET | Freq: Every day | ORAL | Status: DC
  Administered 2024-08-07 – 2024-08-09 (×3): 81 mg via ORAL
  Filled 2024-08-07 (×3): qty 1

## 2024-08-07 MED ORDER — ORAL CARE MOUTH RINSE: 15.0000 mL | OROMUCOSAL | Status: DC | PRN

## 2024-08-07 MED ORDER — VANCOMYCIN HCL IN DEXTROSE 1-5 GM/200ML-% IV SOLN
1000.0000 mg | Freq: Two times a day (BID) | INTRAVENOUS | Status: DC
  Administered 2024-08-07 – 2024-08-09 (×4): 1000 mg via INTRAVENOUS
  Filled 2024-08-07 (×4): qty 200

## 2024-08-07 MED ORDER — KETOROLAC TROMETHAMINE 30 MG/ML IJ SOLN: 30.0000 mg | Freq: Four times a day (QID) | INTRAMUSCULAR | Status: DC | PRN

## 2024-08-07 MED ORDER — SPIRONOLACTONE 25 MG PO TABS
25.0000 mg | ORAL_TABLET | Freq: Every day | ORAL | Status: DC
  Administered 2024-08-07 – 2024-08-09 (×3): 25 mg via ORAL
  Filled 2024-08-07 (×3): qty 1

## 2024-08-07 MED ORDER — FUROSEMIDE 20 MG PO TABS
20.0000 mg | ORAL_TABLET | Freq: Every day | ORAL | Status: DC
  Administered 2024-08-07 – 2024-08-09 (×3): 20 mg via ORAL
  Filled 2024-08-07 (×3): qty 1

## 2024-08-07 MED ORDER — ATORVASTATIN CALCIUM 40 MG PO TABS
40.0000 mg | ORAL_TABLET | Freq: Every day | ORAL | Status: DC
  Administered 2024-08-07 – 2024-08-09 (×3): 40 mg via ORAL
  Filled 2024-08-07 (×3): qty 1

## 2024-08-07 MED ORDER — SACUBITRIL-VALSARTAN 49-51 MG PO TABS
1.0000 | ORAL_TABLET | Freq: Two times a day (BID) | ORAL | Status: DC
  Administered 2024-08-07 – 2024-08-09 (×5): 1 via ORAL
  Filled 2024-08-07 (×5): qty 1

## 2024-08-07 NOTE — Progress Notes (Signed)
 Progress Note   Patient: Jeremy Fritz FMW:[id number] DOB: [birth year] DOA: 08/06/2024     1 DOS: the patient was seen and examined on 08/07/2024   Brief hospital course: Jeremy Fritz is a 43 y.o. 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 Started on Ceftriaxone  and Vancomycin .  Assessment and Plan:  Sepsis due to right forearm abscess Patient presenting with leukocytosis, tachycardia, fever Has clear infectious source with right forearm abscess s/p I&D in the ED with purulent drainage, would culture not done Patient denies any history of injection drug use Continue IV vancomycin  and ceftriaxone  Follow blood cultures Avoiding further aggressive fluid resuscitation given EF 20% and stable BP   Lower back pain CT imaging unrevealing MRI thoracic and lumbar spine ordered and pending Thoracic MRI with progressive R pleural effusion and RUL and RLL airspace disease (already on antibiotics)   Chronic HFrEF TTE 05/08/2024 showed EF 20%, LV global hypokinesis, severely dilated LV internal cavity, moderately reduced RV systolic function, mo-severe MR Patient does not appear to have acute decompensation of CHF He received 1 L LR while in the ED, hold further fluids He has been off of most of his cardiac meds Will start reintroducing his previously prescribed GDMT - Entresto  49-51 mg twice daily - Digoxin  0.125 mg daily - Spironolactone  25 mg daily - Lasix  20 mg daily - Holding Coreg  3.125 mg twice daily given ongoing cocaine use   Hypertension Resuming Entresto , spironolactone , Lasix    History of right MCA stroke Resume aspirin  and atorvastatin    Hyperlipidemia Continue atorvastatin    Polysubstance use Patient reports smoking cocaine and 1 pack cigarettes daily Nicotine  patch provided Cessation encouraged    Mild AST/ALT elevation Chronic and stable     Consultants: None  Procedures: ER I&D 10/11  Antibiotics: Cefepime x 1 Metronidazole x 1 Ceftriaxone  10/12- Vancomycin  10/11-    Subjective: Still feeling crummy.    Physical Exam: Vitals:   08/07/24 0701 08/07/24 0800 08/07/24 1208 08/07/24 1341  BP:  (!) 158/117 (!) 157/109 (!) 141/99  Pulse:  (!) 111 (!) 123 (!) 122  Resp:  19 (!) 21 (!) 22  Temp: 98.1 F (36.7 C)   99.8 F (37.7 C)  TempSrc: Oral     SpO2:  99% 94% 92%    No intake or output data in the 24 hours ending 08/07/24 1538 There were no vitals filed for this visit.  Exam:  General:  Appears calm and comfortable and is in NAD, fatigued, shackled to bed Eyes:  normal lids, iris ENT:  grossly normal hearing, lips & tongue, mmm Cardiovascular:  RR with tachycardia. No LE edema.  Respiratory:   CTA bilaterally with no wheezes/rales/rhonchi.  Normal respiratory effort. Abdomen:  soft, NT, ND Skin:  R anterior forearm abscess s/p I&D; surrounding edema and erythema is improving     Musculoskeletal:  grossly normal tone BUE/BLE, good ROM, no bony abnormality Psychiatric:  blunted mood and affect, speech fluent and appropriate, AOx3 Neurologic:  CN 2-12 grossly intact, moves all extremities in coordinated fashion  Data Reviewed: I have reviewed the patient's lab results since admission.  Pertinent labs for today include:   Glucose 111 Albumin 2.8 WBC 10.8 UDS + BZD, cocaine, THC    Family Communication: Officer is present  Disposition: Status is: Inpatient Remains inpatient appropriate because: ongoing management  Planned Discharge Destination:  Jail    Time spent: 50 minutes  Author: Delon Herald, MD 08/07/2024 3:38 PM  For on call review [email or web address].

## 2024-08-07 NOTE — Plan of Care (Signed)
  Problem: Fluid Volume: Goal: Hemodynamic stability will improve Outcome: Progressing   Problem: Clinical Measurements: Goal: Diagnostic test results will improve Outcome: Progressing Goal: Signs and symptoms of infection will decrease Outcome: Progressing   Problem: Respiratory: Goal: Ability to maintain adequate ventilation will improve Outcome: Progressing   Problem: Education: Goal: Knowledge of General Education information will improve Description Including pain rating scale, medication(s)/side effects and non-pharmacologic comfort measures Outcome: Progressing   Problem: Clinical Measurements: Goal: Ability to maintain clinical measurements within normal limits will improve Outcome: Progressing Goal: Will remain free from infection Outcome: Progressing Goal: Diagnostic test results will improve Outcome: Progressing Goal: Respiratory complications will improve Outcome: Progressing Goal: Cardiovascular complication will be avoided Outcome: Progressing
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
## 2024-08-07 NOTE — Progress Notes (Signed)
Patient still refusing tele.

## 2024-08-07 NOTE — Hospital Course (Signed)
 Jeremy Fritz is a 43 y.o. 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

## 2024-08-08 DIAGNOSIS — L0390 Cellulitis, unspecified: Secondary | ICD-10-CM | POA: Diagnosis not present

## 2024-08-08 DIAGNOSIS — A419 Sepsis, unspecified organism: Secondary | ICD-10-CM | POA: Diagnosis not present

## 2024-08-08 LAB — BASIC METABOLIC PANEL WITH GFR
Anion gap: 11 (ref 5–15)
BUN: 15 mg/dL (ref 6–20)
CO2: 22 mmol/L (ref 22–32)
Calcium: 8.5 mg/dL — ABNORMAL LOW (ref 8.9–10.3)
Chloride: 102 mmol/L (ref 98–111)
Creatinine, Ser: 1.14 mg/dL (ref 0.61–1.24)
GFR, Estimated: >60 mL/min (ref >60)
Glucose, Bld: 126 mg/dL — ABNORMAL HIGH (ref 70–99)
Potassium: 4.5 mmol/L (ref 3.5–5.1)
Sodium: 135 mmol/L (ref 135–145)

## 2024-08-08 LAB — CBC
HCT: 44 % (ref 39.0–52.0)
Hemoglobin: 14.8 g/dL (ref 13.0–17.0)
MCH: 28.1 pg (ref 26.0–34.0)
MCHC: 33.6 g/dL (ref 30.0–36.0)
MCV: 83.5 fL (ref 80.0–100.0)
Platelets: 302 K/uL (ref 150–400)
RBC: 5.27 MIL/uL (ref 4.22–5.81)
RDW: 17.1 % — ABNORMAL HIGH (ref 11.5–15.5)
WBC: 12.1 K/uL — ABNORMAL HIGH (ref 4.0–10.5)
nRBC: 0 % (ref 0.0–0.2)

## 2024-08-08 NOTE — Progress Notes (Signed)
   08/08/24 1552  TOC Brief Assessment  Insurance and Status Reviewed  Patient has primary care physician Yes  Home environment has been reviewed jail  Prior level of function: independent  Prior/Current Home Services No current home services  Social Drivers of Health Review SDOH reviewed no interventions necessary  Readmission risk has been reviewed Yes  Transition of care needs no transition of care needs at this time

## 2024-08-08 NOTE — Progress Notes (Signed)
Refusing cardiac monitior 

## 2024-08-08 NOTE — Progress Notes (Signed)
 Progress Note   Patient: Jeremy Fritz FMW:[id number] DOB: 26-Sep-1981 DOA: 08/06/2024     2 DOS: the patient was seen and examined on 08/08/2024   Brief hospital course: Jeremy Fritz is a 43 y.o. 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  Assessment and Plan:  Sepsis due to right forearm abscess Patient presenting with leukocytosis, tachycardia, fever Has clear infectious source with right forearm abscess s/p I&D in the ED with purulent drainage, would culture not done Patient denies any history of injection drug use Clinical appearance is very suggestive of MRSA; also has a pustular lesion on L temple Continue IV vancomycin  and ceftriaxone  MRSA PCR positive Blood cultures NTD Avoiding further aggressive fluid resuscitation given EF 20% and stable BP   Lower back pain CT imaging unrevealing MRI thoracic and lumbar spine ordered and pending Thoracic MRI with progressive R pleural effusion and RUL and RLL airspace disease (already on antibiotics)   Chronic HFrEF TTE 05/08/2024 showed EF 20%, LV global hypokinesis, severely dilated LV internal cavity, moderately reduced RV systolic function, mo-severe MR Patient does not appear to have acute decompensation of CHF He received 1 L LR while in the ED, hold further fluids He has been off of most of his cardiac meds Will start reintroducing his previously prescribed GDMT - Entresto  49-51 mg twice daily - Digoxin  0.125 mg daily - Spironolactone  25 mg daily - Lasix  20 mg daily - Holding Coreg  3.125 mg twice daily given ongoing cocaine use   Hypertension Resuming Entresto , spironolactone , Lasix    History of right MCA stroke Resume aspirin  and atorvastatin    Hyperlipidemia Continue atorvastatin    Polysubstance use Patient reports smoking cocaine and 1 pack  cigarettes daily Nicotine  patch provided Cessation encouraged   Mild AST/ALT elevation Chronic and stable         Consultants: None   Procedures: ER I&D 10/11   Antibiotics: Cefepime x 1 Metronidazole x 1 Ceftriaxone  10/12- Vancomycin  10/11-      30 Day Unplanned Readmission Risk Score    Flowsheet Row ED to Hosp-Admission (Current) from 08/06/2024 in [facility]  30 Day Unplanned Readmission Risk Score (%) 15.34 Filed at 08/08/2024 0400    This score is the patient's risk of an unplanned readmission within 30 days of being discharged (0 -100%). The score is based on dignosis, age, lab data, medications, orders, and past utilization.   Low:  0-14.9   Medium: 15-21.9   High: 22-29.9   Extreme: 30 and above           Subjective: Arm is less painful, less erythematous.  Systemic symptoms are also improving   Objective: Vitals:   08/08/24 0837 08/08/24 1617  BP: (!) 143/103 (!) 144/86  Pulse: (!) 110 91  Resp: 20 20  Temp: 97.7 F (36.5 C) 98.3 F (36.8 C)  SpO2: 97% 94%   No intake or output data in the 24 hours ending 08/08/24 1656 Filed Weights   08/07/24 1826 08/08/24 0504  Weight: 72.6 kg 75.8 kg    Exam:  General:  Appears calm and comfortable and is in NAD, fatigued, shackled to bed Eyes:  normal lids, iris ENT:  grossly normal hearing, lips & tongue, mmm Cardiovascular:  RR with tachycardia. No LE edema.  Respiratory:   CTA bilaterally with no wheezes/rales/rhonchi.  Normal  respiratory effort. Abdomen:  soft, NT, ND Skin:  R anterior forearm abscess s/p I&D; surrounding edema and erythema is resolving; smaller pustular lesion on L temple Musculoskeletal:  grossly normal tone BUE/BLE, good ROM, no bony abnormality Psychiatric:  blunted mood and affect, speech fluent and appropriate, AOx3 Neurologic:  CN 2-12 grossly intact, moves all extremities in coordinated fashion  Data Reviewed: I have reviewed the  patient's lab results since admission.  Pertinent labs for today include:   Glucose 126 WBC 12.1 MRSA PCR positive Blood cultures NTD     Family Communication: Deputy present      Code Status: Full Code   Disposition: Status is: Inpatient Remains inpatient appropriate because: ongoing management, anticipate dc tomorrow if ongoing improvement     Time spent: 50 minutes  Unresulted Labs (From admission, onward)     Start     Ordered   08/09/24 0500  CBC with Differential/Platelet  Tomorrow morning,   R       Question:  Specimen collection method  Answer:  Lab=Lab collect   08/08/24 1656   08/09/24 0500  Basic metabolic panel with GFR  Tomorrow morning,   R       Question:  Specimen collection method  Answer:  Lab=Lab collect   08/08/24 1656             Author: Delon Herald, MD 08/08/2024 4:56 PM  For on call review [email or web address].

## 2024-08-09 ENCOUNTER — Other Ambulatory Visit (HOSPITAL_COMMUNITY): Payer: Self-pay

## 2024-08-09 DIAGNOSIS — L02413 Cutaneous abscess of right upper limb: Secondary | ICD-10-CM | POA: Diagnosis not present

## 2024-08-09 LAB — CBC WITH DIFFERENTIAL/PLATELET
Abs Immature Granulocytes: 0.05 K/uL (ref 0.00–0.07)
Basophils Absolute: 0.1 K/uL (ref 0.0–0.1)
Basophils Relative: 0 %
Eosinophils Absolute: 0.1 K/uL (ref 0.0–0.5)
Eosinophils Relative: 1 %
HCT: 42.7 % (ref 39.0–52.0)
Hemoglobin: 14.4 g/dL (ref 13.0–17.0)
Immature Granulocytes: 0 %
Lymphocytes Relative: 16 %
Lymphs Abs: 1.9 K/uL (ref 0.7–4.0)
MCH: 28.8 pg (ref 26.0–34.0)
MCHC: 33.7 g/dL (ref 30.0–36.0)
MCV: 85.4 fL (ref 80.0–100.0)
Monocytes Absolute: 1.1 K/uL — ABNORMAL HIGH (ref 0.1–1.0)
Monocytes Relative: 9 %
Neutro Abs: 9 K/uL — ABNORMAL HIGH (ref 1.7–7.7)
Neutrophils Relative %: 74 %
Platelets: 274 K/uL (ref 150–400)
RBC: 5 MIL/uL (ref 4.22–5.81)
RDW: 17.2 % — ABNORMAL HIGH (ref 11.5–15.5)
WBC: 12.2 K/uL — ABNORMAL HIGH (ref 4.0–10.5)
nRBC: 0 % (ref 0.0–0.2)

## 2024-08-09 LAB — BASIC METABOLIC PANEL WITH GFR
Anion gap: 10 (ref 5–15)
BUN: 14 mg/dL (ref 6–20)
CO2: 23 mmol/L (ref 22–32)
Calcium: 8.3 mg/dL — ABNORMAL LOW (ref 8.9–10.3)
Chloride: 105 mmol/L (ref 98–111)
Creatinine, Ser: 0.93 mg/dL (ref 0.61–1.24)
GFR, Estimated: >60 mL/min (ref >60)
Glucose, Bld: 120 mg/dL — ABNORMAL HIGH (ref 70–99)
Potassium: 4.1 mmol/L (ref 3.5–5.1)
Sodium: 137 mmol/L (ref 135–145)

## 2024-08-09 MED ORDER — CHLORHEXIDINE GLUCONATE CLOTH 2 % EX PADS
6.0000 | MEDICATED_PAD | Freq: Every day | CUTANEOUS | 0 refills | Status: AC
  Filled 2024-08-09: qty 30, 5d supply, fill #0

## 2024-08-09 MED ORDER — MUPIROCIN 2 % EX OINT
1.0000 | TOPICAL_OINTMENT | Freq: Two times a day (BID) | CUTANEOUS | 0 refills | Status: AC
  Filled 2024-08-09: qty 22, 11d supply, fill #0

## 2024-08-09 MED ORDER — NICOTINE 21 MG/24HR TD PT24
21.0000 mg | MEDICATED_PATCH | Freq: Every day | TRANSDERMAL | 0 refills | Status: AC
  Filled 2024-08-09: qty 28, 28d supply, fill #0

## 2024-08-09 MED ORDER — LINEZOLID 600 MG PO TABS
600.0000 mg | ORAL_TABLET | Freq: Two times a day (BID) | ORAL | 0 refills | Status: AC
  Filled 2024-08-09: qty 20, 10d supply, fill #0

## 2024-08-09 MED ORDER — AMOXICILLIN-POT CLAVULANATE 875-125 MG PO TABS
1.0000 | ORAL_TABLET | Freq: Two times a day (BID) | ORAL | 0 refills | Status: AC
Start: 2024-08-09 — End: 2024-08-19
  Filled 2024-08-09: qty 20, 10d supply, fill #0

## 2024-08-09 NOTE — Discharge Summary (Addendum)
 Physician Discharge Summary   Patient: Jeremy Fritz MRN: [id number] DOB: 07-Jun-1981  Admit date:     08/06/2024  Discharge date: 08/09/24  Discharge Physician: Delon Herald   PCP: Patient, No Pcp Per   Recommendations at discharge:   Complete antibiotics - Linezolid AND Augmentin twice daily for 10 days Use chlorhexidine wipes and Bactroban intranasal twice daily for 5 days Do NOT use cocaine! 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  Assessment and Plan:  Sepsis due to right forearm abscess Patient presenting with leukocytosis, tachycardia, fever Has clear infectious source with right forearm abscess s/p I&D in the ED with purulent drainage, would culture not done Patient denies any history of injection drug use Clinical appearance is very suggestive of MRSA; also has a pustular lesion on L temple Continue IV vancomycin  and ceftriaxone  -> Linezolid + Augmentin BID x 10 days MRSA PCR positive Blood cultures NTD Avoiding further aggressive fluid resuscitation given EF 20% and stable BP   Lower back pain CT imaging unrevealing MRI thoracic and lumbar spine ordered and pending Thoracic MRI with progressive R pleural effusion and RUL and RLL airspace disease (already on antibiotics)   Chronic HFrEF TTE 05/08/2024 showed EF 20%, LV global hypokinesis, severely dilated LV internal cavity, moderately reduced RV systolic function, mo-severe  MR Patient does not appear to have acute decompensation of CHF He received 1 L LR while in the ED, hold further fluids He has been off of most of his cardiac meds Will start reintroducing his previously prescribed GDMT - Entresto  49-51 mg twice daily - Digoxin  0.125 mg daily - Spironolactone  25 mg daily - Lasix  20 mg daily - Resume Coreg  3.125 mg twice daily    Hypertension Resuming Entresto , spironolactone , Lasix    History of right MCA stroke Resume aspirin  and atorvastatin    Hyperlipidemia Continue atorvastatin    Polysubstance use Patient reports smoking cocaine and 1 pack cigarettes daily Nicotine  patch provided Cessation encouraged   Mild AST/ALT elevation Chronic and stable         Consultants: None   Procedures: ER I&D 10/11   Antibiotics: Cefepime x 1 Metronidazole x 1 Ceftriaxone  10/12- Vancomycin  10/11-    Pain control - [place]  Controlled Substance Reporting System database was reviewed. and patient was instructed, not to drive, operate heavy machinery, perform activities at heights, swimming or participation in water activities or provide baby-sitting services while on Pain, Sleep and Anxiety Medications; until their outpatient Physician has advised to do so again. Also recommended to not to take more than prescribed Pain, Sleep and Anxiety Medications.   Disposition: Jail Diet recommendation:  Regular diet DISCHARGE MEDICATION: Allergies as of 08/09/2024   No Known Allergies      Medication List     STOP taking these medications    nicotine  polacrilex 2 MG gum Commonly known as: NICORETTE        TAKE these medications    albuterol  108 (90 Base) MCG/ACT inhaler Commonly known as: VENTOLIN   HFA Inhale 2 puffs into the lungs every 6 (six) hours as needed for wheezing or shortness of breath.   Aspirin  Low Dose 81 MG tablet Generic drug: aspirin  EC Take 1 tablet (81 mg total) by mouth daily. Swallow whole.   atorvastatin  40 MG  tablet Commonly known as: LIPITOR Take 1 tablet (40 mg total) by mouth daily.   carvedilol  3.125 MG tablet Commonly known as: COREG  Take 1 tablet (3.125 mg total) by mouth 2 (two) times daily with a meal.   Chlorhexidine Gluconate Cloth 2 % Pads Apply 6 each topically daily for 5 days.   digoxin  0.125 MG tablet Commonly known as: LANOXIN  Take 1 tablet (0.125 mg total) by mouth daily.   Entresto  49-51 MG Generic drug: sacubitril -valsartan  Take 1 tablet by mouth 2 (two) times daily.   Farxiga  10 MG Tabs tablet Generic drug: dapagliflozin  propanediol Take 1 tablet (10 mg total) by mouth daily.   furosemide  20 MG tablet Commonly known as: LASIX  Take 1 tablet (20 mg total) by mouth daily.   linezolid 600 MG tablet Commonly known as: ZYVOX Take 1 tablet (600 mg total) by mouth 2 (two) times daily for 10 days.   mupirocin ointment 2 % Commonly known as: BACTROBAN Place 1 Application into the nose 2 (two) times daily for 5 days.   nicotine  21 mg/24hr patch Commonly known as: NICODERM CQ  - dosed in mg/24 hours Place 1 patch (21 mg total) onto the skin daily.   spironolactone  25 MG tablet Commonly known as: ALDACTONE  Take 1 tablet (25 mg total) by mouth daily.        Discharge Exam:   Subjective: Feeling better.  No concerns today.   Objective: Vitals:   08/09/24 0221 08/09/24 0614  BP: (!) 138/98 (!) 138/108  Pulse: (!) 102 99  Resp: 18 20  Temp: 99 F (37.2 C) 98.3 F (36.8 C)  SpO2: 95% 99%    Intake/Output Summary (Last 24 hours) at 08/09/2024 0838 Last data filed at 08/09/2024 0615 Gross per 24 hour  Intake 1058.26 ml  Output 1300 ml  Net -241.74 ml   Filed Weights   08/07/24 1826 08/08/24 0504 08/09/24 0500  Weight: 72.6 kg 75.8 kg 75.1 kg    Exam:  General:  Appears calm and comfortable and is in NAD, shackled to bed Eyes:  normal lids, iris ENT:  grossly normal hearing, lips & tongue, mmm Cardiovascular:  RR with tachycardia. No LE  edema.  Respiratory:   CTA bilaterally with no wheezes/rales/rhonchi.  Normal respiratory effort. Abdomen:  soft, NT, ND Skin:  R anterior forearm abscess s/p I&D without surrounding edema or erythema; smaller pustular lesion on L temple appears to be stable Musculoskeletal:  grossly normal tone BUE/BLE, good ROM, no bony abnormality Psychiatric:  blunted mood and affect, speech fluent and appropriate, AOx3 Neurologic:  CN 2-12 grossly intact, moves all extremities in coordinated fashion  Data Reviewed: I have reviewed the patient's lab results since admission.  Pertinent labs for today include:   Glucose 120 WBC 12.2 Blood cultures NTD    Condition at discharge: improving  The results of significant diagnostics from this hospitalization (including imaging, microbiology, ancillary and laboratory) are listed below for reference.   Imaging Studies: MR THORACIC SPINE WO CONTRAST Result Date: 08/07/2024 EXAM: MRI THORACIC SPINE WITHOUT INTRAVENOUS CONTRAST 08/07/2024 11:39:00 AM TECHNIQUE: Multiplanar multisequence MRI of the thoracic spine was performed without the administration of intravenous contrast. COMPARISON: None available. CLINICAL HISTORY: Mid-back pain, infection suspected, no prior  imaging. Pt is an inmate in flex cuffs. Pt became uncooperative during the thoracic scan and refused to continue, resulting in an incomplete study with best obtainable images sent. FINDINGS: LIMITATIONS/ARTIFACTS: The study is incomplete due to patient non-cooperation. Best obtainable images were sent. BONES AND ALIGNMENT: Normal alignment. Normal vertebral body heights. Bone marrow signal is unremarkable. No abnormal enhancement. SPINAL CORD: Normal spinal cord volume. Normal spinal cord signal. SOFT TISSUES: Unremarkable. DEGENERATIVE CHANGES: No significant disc herniation. No spinal canal stenosis or neural foraminal narrowing. PLEURAL SPACES: A progressive right pleural effusion and right upper and  lower lobe airspace disease is present. IMPRESSION: 1. No acute findings in the thoracic spine. 2. Progressive right pleural effusion and right upper and lower lobe airspace disease is concerning for pneumonia Electronically signed by: Lonni Necessary MD 08/07/2024 12:08 PM EDT RP Workstation: [id number]   CT Head Wo Contrast Result Date: 08/06/2024 CLINICAL DATA:  Altered mental status EXAM: CT HEAD WITHOUT CONTRAST TECHNIQUE: Contiguous axial images were obtained from the base of the skull through the vertex without intravenous contrast. RADIATION DOSE REDUCTION: This exam was performed according to the departmental dose-optimization program which includes automated exposure control, adjustment of the mA and/or kV according to patient size and/or use of iterative reconstruction technique. COMPARISON:  01/27/2023 FINDINGS: Brain: No evidence of acute infarction, hemorrhage, hydrocephalus, extra-axial collection or mass lesion/mass effect. Chronic right MCA infarct is again noted. Vascular: No hyperdense vessel or unexpected calcification. Skull: Normal. Negative for fracture or focal lesion. Sinuses/Orbits: No acute finding. Other: None. IMPRESSION: Chronic right MCA infarct.  No acute abnormality noted. Electronically Signed   By: Oneil Devonshire M.D.   On: 08/06/2024 22:14   CT T-SPINE NO CHARGE Result Date: 08/06/2024 CLINICAL DATA:  Fevers and back pain, initial encounter EXAM: CT THORACIC SPINE WITHOUT CONTRAST TECHNIQUE: Multidetector CT images of the thoracic were obtained using the standard protocol without intravenous contrast. RADIATION DOSE REDUCTION: This exam was performed according to the departmental dose-optimization program which includes automated exposure control, adjustment of the mA and/or kV according to patient size and/or use of iterative reconstruction technique. COMPARISON:  None Available. FINDINGS: Alignment: Within normal limits. Vertebrae: 12 thoracic vertebra are noted.  Vertebral body height is well maintained. No compression deformity is seen. Paraspinal and other soft tissues: Paraspinal soft tissues are within normal limits. Ingested material is noted within the esophagus. 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 Disc levels: No specific disc pathology is noted. IMPRESSION: No acute abnormality noted. Electronically Signed   By: Oneil Devonshire M.D.   On: 08/06/2024 22:13   CT L-SPINE NO CHARGE Result Date: 08/06/2024 CLINICAL DATA:  Fever and chills EXAM: CT LUMBAR SPINE WITHOUT CONTRAST TECHNIQUE: Multidetector CT imaging of the lumbar spine was performed without intravenous contrast administration. Multiplanar CT image reconstructions were also generated. RADIATION DOSE REDUCTION: This exam was performed according to the departmental dose-optimization program which includes automated exposure control, adjustment of the mA and/or kV according to patient size and/or use of iterative reconstruction technique. COMPARISON:  None Available. FINDINGS: Segmentation: 5 lumbar type vertebral bodies are well visualized. Alignment: Vertebral body height and alignment are well maintained. Vertebrae: No anterolisthesis is seen.  No pars defects are seen. Paraspinal and other soft tissues: Paraspinal soft tissues are within normal limits. Disc levels: No acute disc pathology is seen. IMPRESSION: No acute abnormality noted. Electronically Signed   By: Oneil Devonshire M.D.   On: 08/06/2024 22:10   CT CHEST ABDOMEN PELVIS W CONTRAST Result Date:  08/06/2024 CLINICAL DATA:  Possible sepsis EXAM: CT CHEST, ABDOMEN, AND PELVIS WITH CONTRAST TECHNIQUE: Multidetector CT imaging of the chest, abdomen and pelvis was performed following the standard protocol during bolus administration of intravenous contrast. RADIATION DOSE REDUCTION: This exam was performed according to the departmental dose-optimization program which includes automated exposure control, adjustment of the mA and/or kV  according to patient size and/or use of iterative reconstruction technique. CONTRAST:  OMNIPAQUE  IOHEXOL  300 MG/ML  SOLN COMPARISON:  Chest x-ray from earlier in the same day. 05/07/2024 CT FINDINGS: CT CHEST FINDINGS Cardiovascular: Aberrant right subclavian artery is noted. Thoracic aorta is otherwise within normal limits. Heart is enlarged in size. No pericardial effusion is noted. Mild coronary calcifications are noted. No large central pulmonary embolism is seen although not timed for embolus evaluation. Mediastinum/Nodes: Thoracic inlet is within normal limits. The esophagus as visualized is unremarkable. No hilar or mediastinal adenopathy is noted. Lungs/Pleura: Lungs are well aerated bilaterally. Significant improved aeration is seen when compared with the prior exam. No sizable effusion is seen. No parenchymal nodules are seen. Musculoskeletal: No chest wall mass or suspicious bone lesions identified. CT ABDOMEN PELVIS FINDINGS Hepatobiliary: No focal liver abnormality is seen. No gallstones, gallbladder wall thickening, or biliary dilatation. Pancreas: Unremarkable. No pancreatic ductal dilatation or surrounding inflammatory changes. Spleen: Normal in size without focal abnormality. Adrenals/Urinary Tract: Adrenal glands are within normal limits. Kidneys demonstrate a normal enhancement pattern. Renal cysts are seen stable from prior exam. No follow-up is recommended. No renal calculi or obstructive changes are seen. Bladder is partially distended. Stomach/Bowel: Fecal material is noted throughout the colon. No obstructive changes are seen. The appendix is not well visualized. No inflammatory changes are seen to suggest appendicitis. Small bowel and stomach are unremarkable. Vascular/Lymphatic: No significant vascular findings are present. No enlarged abdominal or pelvic lymph nodes. Reproductive: Prostate is unremarkable. Other: No abdominal wall hernia or abnormality. No abdominopelvic ascites.  Musculoskeletal: No acute or significant osseous findings. IMPRESSION: CT of the chest: Aberrant right subclavian artery is noted. No acute abnormality is seen. CT of the abdomen and pelvis: No acute abnormality noted. Electronically Signed   By: Oneil Devonshire M.D.   On: 08/06/2024 22:08   DG Forearm Right Result Date: 08/06/2024 CLINICAL DATA:  Forearm infection EXAM: RIGHT FOREARM - 2 VIEW COMPARISON:  None Available. FINDINGS: Soft tissue swelling noted in the mid forearm. No acute bony abnormality. Specifically, no fracture, subluxation, or dislocation. No radiopaque foreign body. IMPRESSION: No fracture or foreign body. Electronically Signed   By: Franky Crease M.D.   On: 08/06/2024 20:34   DG Chest Port 1 View Result Date: 08/06/2024 CLINICAL DATA:  Questionable sepsis. EXAM: PORTABLE CHEST 1 VIEW COMPARISON:  05/07/2024 FINDINGS: Cardiomegaly. No confluent airspace opacities, effusions or edema. No acute bony abnormality. IMPRESSION: Cardiomegaly.  No active disease. Electronically Signed   By: Franky Crease M.D.   On: 08/06/2024 19:54    Microbiology: Results for orders placed or performed during the hospital encounter of 08/06/24  Culture, blood (Routine x 2)     Status: None (Preliminary result)   Collection Time: 08/06/24  6:45 PM   Specimen: BLOOD  Result Value Ref Range Status   Specimen Description   Final    BLOOD LEFT ANTECUBITAL Performed at [facility], [address] [place], [place] [address]    Special Requests   Final    BOTTLES DRAWN AEROBIC AND ANAEROBIC Blood Culture adequate volume Performed at [facility],  [address] [place], [place] [address]    Culture   Final    NO GROWTH 3 DAYS Performed at [facility], [address] [place], [place] [address]    Report Status PENDING  Incomplete  Resp panel by RT-PCR (RSV, Flu A&B, Covid) Urine, Clean Catch     Status: None   Collection Time: 08/06/24  8:15 PM    Specimen: Urine, Clean Catch; Nasal Swab  Result Value Ref Range Status   SARS Coronavirus 2 by RT PCR NEGATIVE NEGATIVE Final    Comment: (NOTE) SARS-CoV-2 target nucleic acids are NOT DETECTED.  The SARS-CoV-2 RNA is generally detectable in upper respiratory specimens during the acute phase of infection. The lowest concentration of SARS-CoV-2 viral copies this assay can detect is 138 copies/mL. A negative result does not preclude SARS-Cov-2 infection and should not be used as the sole basis for treatment or other patient management decisions. A negative result may occur with  improper specimen collection/handling, submission of specimen other than nasopharyngeal swab, presence of viral mutation(s) within the areas targeted by this assay, and inadequate number of viral copies(<138 copies/mL). A negative result must be combined with clinical observations, patient history, and epidemiological information. The expected result is Negative.  Fact Sheet for Patients:  BloggerCourse.com  Fact Sheet for Healthcare Providers:  SeriousBroker.it  This test is no t yet approved or cleared by the [place]  FDA and  has been authorized for detection and/or diagnosis of SARS-CoV-2 by FDA under an Emergency Use Authorization (EUA). This EUA will remain  in effect (meaning this test can be used) for the duration of the COVID-19 declaration under Section 564(b)(1) of the Act, 21 U.S.C.section 360bbb-3(b)(1), unless the authorization is terminated  or revoked sooner.       Influenza A by PCR NEGATIVE NEGATIVE Final   Influenza B by PCR NEGATIVE NEGATIVE Final    Comment: (NOTE) The Xpert Xpress SARS-CoV-2/FLU/RSV plus assay is intended as an aid in the diagnosis of influenza from Nasopharyngeal swab specimens and should not be used as a sole basis for treatment. Nasal washings and aspirates are unacceptable for Xpert Xpress  SARS-CoV-2/FLU/RSV testing.  Fact Sheet for Patients: BloggerCourse.com  Fact Sheet for Healthcare Providers: SeriousBroker.it  This test is not yet approved or cleared by the [place]  FDA and has been authorized for detection and/or diagnosis of SARS-CoV-2 by FDA under an Emergency Use Authorization (EUA). This EUA will remain in effect (meaning this test can be used) for the duration of the COVID-19 declaration under Section 564(b)(1) of the Act, 21 U.S.C. section 360bbb-3(b)(1), unless the authorization is terminated or revoked.     Resp Syncytial Virus by PCR NEGATIVE NEGATIVE Final    Comment: (NOTE) Fact Sheet for Patients: BloggerCourse.com  Fact Sheet for Healthcare Providers: SeriousBroker.it  This test is not yet approved or cleared by the [place]  FDA and has been authorized for detection and/or diagnosis of SARS-CoV-2 by FDA under an Emergency Use Authorization (EUA). This EUA will remain in effect (meaning this test can be used) for the duration of the COVID-19 declaration under Section 564(b)(1) of the Act, 21 U.S.C. section 360bbb-3(b)(1), unless the authorization is terminated or revoked.  Performed at [facility], [address], [place], [place] [address]   Culture, blood (Routine x 2)     Status: None (Preliminary result)   Collection Time: 08/07/24  1:55 PM   Specimen: BLOOD RIGHT HAND  Result Value Ref Range Status  Specimen Description   Final    BLOOD RIGHT HAND Performed at [facility], [address] [place], [place] [address]    Special Requests   Final    BOTTLES DRAWN AEROBIC ONLY Blood Culture results may not be optimal due to an inadequate volume of blood received in culture bottles Performed at [facility], [address], [place], [place] [address]    Culture   Final    NO GROWTH 2  DAYS Performed at [facility], [address] [place], [place] [address]    Report Status PENDING  Incomplete  MRSA Next Gen by PCR, Nasal     Status: Abnormal   Collection Time: 08/07/24  2:39 PM   Specimen: Nasal Mucosa; Nasal Swab  Result Value Ref Range Status   MRSA by PCR Next Gen DETECTED (A) NOT DETECTED Final    Comment: RESULT CALLED TO, READ BACK BY AND VERIFIED WITH: EMERSON CHAMPION, RN 1935 08/07/24 BY ALONSO CORDS (NOTE) The GeneXpert MRSA Assay (FDA approved for NASAL specimens only), is one component of a comprehensive MRSA colonization surveillance program. It is not intended to diagnose MRSA infection nor to guide or monitor treatment for MRSA infections. Test performance is not FDA approved in patients less than 76 years old. Performed at [facility], [address], [place], [place] [address]     Labs: CBC: Recent Labs  Lab 08/06/24 1845 08/07/24 0457 08/08/24 0853 08/09/24 0547  WBC 14.1* 10.8* 12.1* 12.2*  NEUTROABS 10.4*  --   --  9.0*  HGB 15.7 13.6 14.8 14.4  HCT 47.6 41.9 44.0 42.7  MCV 84.8 85.7 83.5 85.4  PLT 324 278 302 274   Basic Metabolic Panel: Recent Labs  Lab 08/06/24 1845 08/06/24 2016 08/07/24 0457 08/08/24 0853 08/09/24 0547  NA 141  --  141 135 137  K 4.0  --  3.8 4.5 4.1  CL 104  --  108 102 105  CO2 26  --  24 22 23   GLUCOSE 100*  --  111* 126* 120*  BUN 13  --  15 15 14   CREATININE 1.15  --  0.94 1.14 0.93  CALCIUM  9.2  --  8.1* 8.5* 8.3*  MG  --  2.1  --   --   --    Liver Function Tests: Recent Labs  Lab 08/06/24 1845 08/07/24 0457  AST 56* 34  ALT 46* 34  ALKPHOS 72 58  BILITOT 1.1 0.6  PROT 6.8 5.4*  ALBUMIN 3.5 2.8*   CBG: No results for input(s): GLUCAP in the last 168 hours.  Discharge time spent: greater than 30 minutes.  Signed: Delon Herald, MD [facility] 08/09/2024

## 2024-08-09 NOTE — Progress Notes (Signed)
 Augmentin delivered in bag to patient's room by this RN -given to police officer/sheriff in room

## 2024-08-09 NOTE — Plan of Care (Signed)

## 2024-08-09 NOTE — Progress Notes (Signed)
 Discharge medication delivered to patients room, meds given to police office at bedside, discharge instructions placed in packet and given to officer for return to jail.

## 2024-08-11 LAB — CULTURE, BLOOD (ROUTINE X 2)
Culture: NO GROWTH
Special Requests: ADEQUATE

## 2024-08-12 LAB — CULTURE, BLOOD (ROUTINE X 2): Culture: NO GROWTH
